# Patient Record
Sex: Female | Born: 1980
Health system: Southern US, Community
[De-identification: ages and names within clinical notes are randomized; demographics above are authoritative.]

## PROBLEM LIST (undated history)

## (undated) ENCOUNTER — Inpatient Hospital Stay (HOSPITAL_COMMUNITY): Payer: Self-pay

## (undated) DIAGNOSIS — G43909 Migraine, unspecified, not intractable, without status migrainosus: Secondary | ICD-10-CM

## (undated) DIAGNOSIS — N62 Hypertrophy of breast: Secondary | ICD-10-CM

## (undated) DIAGNOSIS — K219 Gastro-esophageal reflux disease without esophagitis: Secondary | ICD-10-CM

## (undated) DIAGNOSIS — F419 Anxiety disorder, unspecified: Secondary | ICD-10-CM

## (undated) DIAGNOSIS — R0989 Other specified symptoms and signs involving the circulatory and respiratory systems: Secondary | ICD-10-CM

## (undated) HISTORY — PX: COLPOSCOPY: SHX161

## (undated) HISTORY — DX: Anxiety disorder, unspecified: F41.9

## (undated) HISTORY — PX: CRYOTHERAPY: SHX1416

## (undated) HISTORY — PX: APPENDECTOMY: SHX54

---
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
## 2011-02-19 ENCOUNTER — Ambulatory Visit (INDEPENDENT_AMBULATORY_CARE_PROVIDER_SITE_OTHER): Payer: Commercial Managed Care - PPO

## 2011-02-19 DIAGNOSIS — J029 Acute pharyngitis, unspecified: Secondary | ICD-10-CM

## 2011-02-26 ENCOUNTER — Ambulatory Visit (INDEPENDENT_AMBULATORY_CARE_PROVIDER_SITE_OTHER): Payer: Commercial Managed Care - PPO

## 2011-02-26 DIAGNOSIS — L708 Other acne: Secondary | ICD-10-CM

## 2011-03-24 ENCOUNTER — Ambulatory Visit (INDEPENDENT_AMBULATORY_CARE_PROVIDER_SITE_OTHER): Payer: Commercial Managed Care - PPO | Admitting: Family Medicine

## 2011-03-24 VITALS — BP 108/72 | HR 62 | Temp 98.4°F | Resp 16 | Ht 60.0 in | Wt 164.0 lb

## 2011-03-24 DIAGNOSIS — J019 Acute sinusitis, unspecified: Secondary | ICD-10-CM

## 2011-03-24 DIAGNOSIS — J329 Chronic sinusitis, unspecified: Secondary | ICD-10-CM

## 2011-03-24 DIAGNOSIS — G43909 Migraine, unspecified, not intractable, without status migrainosus: Secondary | ICD-10-CM

## 2011-03-24 MED ORDER — AMOXICILLIN-POT CLAVULANATE 875-125 MG PO TABS: 1.0000 | ORAL_TABLET | Freq: Two times a day (BID) | ORAL | Status: AC

## 2011-03-24 NOTE — Patient Instructions (Signed)

## 2011-03-24 NOTE — Progress Notes (Signed)
  Subjective:    Patient ID: [name] [name], female    DOB: 31-Jul-1980, 31 y.o.   MRN: [id number]  Sinusitis This is a new problem. The current episode started in the past 7 days. The problem is unchanged. There has been no fever. Her pain is at a severity of 1/10. Associated symptoms include congestion. Pertinent negatives include no coughing. Past treatments include spray decongestants. The treatment provided no relief.  Sore Throat  Associated symptoms include congestion. Pertinent negatives include no coughing.      Review of Systems  Constitutional: Negative for fever.  HENT: Positive for congestion.        Sore throat  Respiratory: Negative for cough.   All other systems reviewed and are negative.       Objective:   Physical Exam  Constitutional: She appears well-developed and well-nourished.  HENT:  Head: Normocephalic and atraumatic.  Right Ear: External ear normal.  Left Ear: External ear normal.  Mouth/Throat: Oropharynx is clear and moist. Oropharyngeal exudate: nose has yellow purulent d/c.  Eyes: Conjunctivae and EOM are normal. Pupils are equal, round, and reactive to light.  Neck: Normal range of motion. Neck supple.          Assessment & Plan:  Sinusitis, acute

## 2011-04-07 ENCOUNTER — Other Ambulatory Visit: Payer: Self-pay | Admitting: Physician Assistant

## 2011-04-07 MED ORDER — FLUCONAZOLE 150 MG PO TABS: 150.0000 mg | ORAL_TABLET | Freq: Once | ORAL | Status: AC

## 2011-04-19 ENCOUNTER — Other Ambulatory Visit: Payer: Self-pay

## 2011-04-19 MED ORDER — FLUCONAZOLE 150 MG PO TABS: 150.0000 mg | ORAL_TABLET | Freq: Once | ORAL | Status: AC

## 2011-05-17 ENCOUNTER — Ambulatory Visit (INDEPENDENT_AMBULATORY_CARE_PROVIDER_SITE_OTHER): Payer: Commercial Managed Care - PPO | Admitting: Physician Assistant

## 2011-05-17 VITALS — BP 106/80 | HR 80 | Temp 98.2°F | Resp 16 | Ht 60.0 in | Wt 161.0 lb

## 2011-05-17 DIAGNOSIS — N912 Amenorrhea, unspecified: Secondary | ICD-10-CM

## 2011-05-17 DIAGNOSIS — S63509A Unspecified sprain of unspecified wrist, initial encounter: Secondary | ICD-10-CM

## 2011-05-17 LAB — POCT URINE PREGNANCY: Preg Test, Ur: POSITIVE

## 2011-05-17 MED ORDER — PRENATAL VITAMINS (DIS) PO TABS: 1.0000 | ORAL_TABLET | Freq: Every morning | ORAL | Status: DC

## 2011-05-17 NOTE — Progress Notes (Signed)
  Subjective:    Patient ID: [name] [name], female    DOB: 1981/01/18, 31 y.o.   MRN: [id number]  HPI [name] [name] comes in today c/o right wrist pain for 24 hours.  Initially noticed a bruise on the ulnar aspect of her wirst/hand ~ 5 days ago but had no pain until last pm.  Wrist became swollen and painful on radial aspect.  Pain with ROM.  No erythema, other arthralgias, fever, chills, illness.  Had never had anything like this before.  Denies any injury although walked her large dog Saturday before noticing the bruise and remembers that she had to pull the leash a lot because the dog was pulling.     Review of Systems    as above Objective:   Physical Exam  Right wrist has a small amount of swelling.  Tender to palpation ulnar aspect.  No bruising identified.  Pain radial aspect with extension and some pain with thumb rotation. No pain with flexion  Negative Finkelstein.       Assessment & Plan:  Sprain, right wrist.   Pain wrist Amenorrhea  ACE wrap for now.  Progress into thumb spica, or wrist splint if needed.  Tylenol only.  Ice.  Return if worse. PNV #90

## 2011-05-17 NOTE — Progress Notes (Signed)
Addended by: Pattricia Boss on: 05/17/2011 11:02 AM   Modules accepted: Orders

## 2011-05-17 NOTE — Progress Notes (Signed)
Addended by: Pattricia Boss on: 05/17/2011 10:48 AM   Modules accepted: Orders

## 2011-05-25 ENCOUNTER — Encounter: Payer: Self-pay | Admitting: *Deleted

## 2011-06-03 ENCOUNTER — Telehealth: Payer: Self-pay | Admitting: *Deleted

## 2011-06-03 MED ORDER — ONDANSETRON HCL 4 MG PO TABS: ORAL_TABLET | ORAL | Status: DC

## 2011-06-03 NOTE — Telephone Encounter (Signed)
Patient notified

## 2011-06-03 NOTE — Telephone Encounter (Signed)
PT HAS VOMITING ALL NIGHT AND WOULD LIKE TO KNOW IF WE SEND IN SOME ZOFRAN [facility]

## 2011-06-05 ENCOUNTER — Ambulatory Visit (INDEPENDENT_AMBULATORY_CARE_PROVIDER_SITE_OTHER): Payer: Commercial Managed Care - PPO | Admitting: Family Medicine

## 2011-06-05 VITALS — BP 113/76 | HR 93 | Temp 98.0°F | Resp 16 | Ht 60.0 in | Wt 159.1 lb

## 2011-06-05 DIAGNOSIS — O2690 Pregnancy related conditions, unspecified, unspecified trimester: Secondary | ICD-10-CM

## 2011-06-05 DIAGNOSIS — R1110 Vomiting, unspecified: Secondary | ICD-10-CM

## 2011-06-05 LAB — POCT UA - MICROSCOPIC ONLY
Casts, Ur, LPF, POC: NEGATIVE
Crystals, Ur, HPF, POC: NEGATIVE
Mucus, UA: POSITIVE
Yeast, UA: NEGATIVE

## 2011-06-05 LAB — POCT CBC
Granulocyte percent: 78.1 %G (ref 37–80)
HCT, POC: 41.9 % (ref 37.7–47.9)
Hemoglobin: 13.7 g/dL (ref 12.2–16.2)
Lymph, poc: 1.6 (ref 0.6–3.4)
MCH, POC: 29.7 pg (ref 27–31.2)
MCHC: 32.7 g/dL (ref 31.8–35.4)
MCV: 90.9 fL (ref 80–97)
MID (cbc): 0.4 (ref 0–0.9)
MPV: 8.3 fL (ref 0–99.8)
POC Granulocyte: 7.3 — AB (ref 2–6.9)
POC LYMPH PERCENT: 17.3 %L (ref 10–50)
POC MID %: 4.6 %M (ref 0–12)
Platelet Count, POC: 235 10*3/uL (ref 142–424)
RBC: 4.61 M/uL (ref 4.04–5.48)
RDW, POC: 13.5 %
WBC: 9.3 10*3/uL (ref 4.6–10.2)

## 2011-06-05 LAB — POCT URINALYSIS DIPSTICK
Glucose, UA: NEGATIVE
Ketones, UA: 80
Leukocytes, UA: NEGATIVE
Nitrite, UA: NEGATIVE
Spec Grav, UA: >=1.03
Urobilinogen, UA: 0.2
pH, UA: 5

## 2011-06-05 MED ORDER — ONDANSETRON 8 MG PO TBDP: 8.0000 mg | ORAL_TABLET | Freq: Three times a day (TID) | ORAL | Status: AC | PRN

## 2011-06-05 NOTE — Progress Notes (Signed)
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  She has been on Zofran for 2 days, but still is unable to keep anything down except occasional sips and small snacks.  No UTI sx or fever, but does feel lightheaded, weak, and chilled.  O:  Patient observed in the clinic to be flushed and weak. Abdomen:  Soft, nontender without masses Chest:  Clear Heart: reg, no murmur or gallop, rate about 100 Skin:  Mottled on limbs and abdomen. Results for orders placed in visit on 06/05/11  POCT CBC      Component Value Range   WBC 9.3  4.6 - 10.2 (K/uL)   Lymph, poc 1.6  0.6 - 3.4    POC LYMPH PERCENT 17.3  10 - 50 (%L)   MID (cbc) 0.4  0 - 0.9    POC MID % 4.6  0 - 12 (%M)   POC Granulocyte 7.3 (*) 2 - 6.9    Granulocyte percent 78.1  37 - 80 (%G)   RBC 4.61  4.04 - 5.48 (M/uL)   Hemoglobin 13.7  12.2 - 16.2 (g/dL)   HCT, POC 45.4  09.8 - 47.9 (%)   MCV 90.9  80 - 97 (fL)   MCH, POC 29.7  27 - 31.2 (pg)   MCHC 32.7  31.8 - 35.4 (g/dL)   RDW, POC 11.9     Platelet Count, POC 235  142 - 424 (K/uL)   MPV 8.3  0 - 99.8 (fL)  POCT UA - MICROSCOPIC ONLY      Component Value Range   WBC, Ur, HPF, POC 1-2     RBC, urine, microscopic 1-6     Bacteria, U Microscopic 1+     Mucus, UA positive     Epithelial cells, urine per micros 4-13     Crystals, Ur, HPF, POC negative     Casts, Ur, LPF, POC negative     Yeast, UA negative    POCT URINALYSIS DIPSTICK      Component Value Range   Color, UA yellow     Clarity, UA clear     Glucose, UA negative     Bilirubin, UA small     Ketones, UA 80     Spec Grav, UA >=1.030     Blood, UA moderate     pH, UA 5.0     Protein, UA trace     Urobilinogen, UA 0.2     Nitrite, UA negative     Leukocytes, UA Negative     A: Possibly hyperemesis, more likely viral syndrome.  P:

## 2011-06-05 NOTE — Patient Instructions (Signed)
Hyperemesis Gravidarum  Hyperemesis gravidarum is a severe form of nausea and vomiting that happens during pregnancy. Hyperemesis is worse than morning sickness. It may cause a woman to have nausea or vomiting all day for many days. It may keep a woman from eating and drinking enough food and liquids. Hyperemesis usually occurs during the first half (the first 20 weeks) of pregnancy. It often goes away once a woman is in her second half of pregnancy. However, sometimes hyperemesis continues through an entire pregnancy.   CAUSES   The cause of this condition is not completely known but is thought to be due to changes in the body's hormones when pregnant. It could be the high level of the pregnancy hormone or an increase in estrogen in the body.   SYMPTOMS    Severe nausea and vomiting.   Nausea that does not go away.   Vomiting that does not allow you to keep any food down.   Weight loss and body fluid loss (dehydration).   Having no desire to eat or not liking food you have previously enjoyed.  DIAGNOSIS   Your caregiver may ask you about your symptoms. Your caregiver may also order blood tests and urine tests to make sure something else is not causing the problem.   TREATMENT   You may only need medicine to control the problem. If medicines do not control the nausea and vomiting, you will be treated in the hospital to prevent dehydration, acidosis, weight loss, and changes in the electrolytes in your body that may harm the unborn baby (fetus). You may need intravenous (IV) fluids.   HOME CARE INSTRUCTIONS    Take all medicine as directed by your caregiver.   Try eating a couple of dry crackers or toast in the morning before getting out of bed.   Avoid foods and smells that upset your stomach.   Avoid fatty and spicy foods. Eat 5 to 6 small meals a day.   Do not drink when eating meals. Drink between meals.   For snacks, eat high protein foods, such as cheese. Eat or suck on things that have ginger in  them. Ginger helps nausea.   Avoid food preparation. The smell of food can spoil your appetite.   Avoid iron pills and iron in your multivitamins until after 3 to 4 months of being pregnant.  SEEK MEDICAL CARE IF:    Your abdominal pain increases since the last time you saw your caregiver.   You have a severe headache.   You develop vision problems.   You feel you are losing weight.  SEEK IMMEDIATE MEDICAL CARE IF:    You are unable to keep fluids down.   You vomit blood.   You have constant nausea and vomiting.   You have a fever.   You have excessive weakness, dizziness, fainting, or extreme thirst.  MAKE SURE YOU:    Understand these instructions.   Will watch your condition.   Will get help right away if you are not doing well or get worse.  Document Released: 02/05/2005 Document Revised: 01/25/2011 Document Reviewed: 05/08/2010  [employer] Patient Information 2012 [employer].

## 2011-06-07 ENCOUNTER — Ambulatory Visit (INDEPENDENT_AMBULATORY_CARE_PROVIDER_SITE_OTHER): Payer: Commercial Managed Care - PPO | Admitting: Physician Assistant

## 2011-06-07 DIAGNOSIS — R112 Nausea with vomiting, unspecified: Secondary | ICD-10-CM

## 2011-06-07 DIAGNOSIS — R1084 Generalized abdominal pain: Secondary | ICD-10-CM

## 2011-06-07 LAB — POCT URINALYSIS DIPSTICK
Nitrite, UA: NEGATIVE
Protein, UA: NEGATIVE
Urobilinogen, UA: 0.2
pH, UA: 5.5

## 2011-06-07 LAB — POCT UA - MICROSCOPIC ONLY
Casts, Ur, LPF, POC: NEGATIVE
Crystals, Ur, HPF, POC: NEGATIVE

## 2011-06-07 LAB — POCT CBC
Granulocyte percent: 70.8 %G (ref 37–80)
Hemoglobin: 13.6 g/dL (ref 12.2–16.2)
MCH, POC: 29.8 pg (ref 27–31.2)
MID (cbc): 0.4 (ref 0–0.9)
MPV: 8.5 fL (ref 0–99.8)
POC Granulocyte: 3.8 (ref 2–6.9)
POC MID %: 7.2 %M (ref 0–12)
Platelet Count, POC: 194 10*3/uL (ref 142–424)
RBC: 4.57 M/uL (ref 4.04–5.48)
WBC: 5.3 10*3/uL (ref 4.6–10.2)

## 2011-06-07 LAB — HCG, QUANTITATIVE, PREGNANCY: hCG, Beta Chain, Quant, S: 57037 m[IU]/mL

## 2011-06-07 NOTE — Progress Notes (Signed)
Subjective:    Patient ID: Faythe Ghee, female    DOB: 09-Dec-1980, 31 y.o.   MRN: [id number]  HPI Ms. Eddleman comes in again today for follow up.  She states that she had diarrhea on Saturday and vomiting Saturday pm.  Zofran was called in for her by [place] but continued to vomit.  She started cramping.  Increased generalized abdominal cramping and pain on Monday.  No vomiting as of Monday but continues with loose stool. She was seen here on Tuesday.  CBC was normal (WBC was 9.3) but UA dip with moderate blood.  She denies hematuria or spotting.  She was given 2 liters of NACL IV fluids, which helped some of her symptoms.  Her appetite came back a little bit yesterday.  Today her pain feels like hunger pains and nausea.  Eating continues to be difficult.  Low grade fever today.  Chills and clammy sweats yesterday and today. No dysuria. No unusual vaginal discharge.  Saw OB 05/30/11.  Had [place] for dates.  EDC ~Decemeber 2013.  She has follow up appt with OB tomorrow.   Review of Systems As above    Objective:   Physical Exam  Constitutional: She is oriented to person, place, and time. She appears well-developed and well-nourished. She does not have a sickly appearance. She does not appear ill. No distress.  Cardiovascular: Normal rate and regular rhythm.   Pulmonary/Chest: Effort normal and breath sounds normal.  Abdominal: Soft. Normal appearance. There is tenderness in the right lower quadrant. There is no rebound, no CVA tenderness and no tenderness at McBurney's point.       Mild RLQ pain.  No rebound  Genitourinary: Vagina normal. Uterus is deviated and enlarged. Cervix exhibits discharge. Cervix exhibits no motion tenderness. Right adnexum displays fullness.       White discharge in vault.  Os closed.  No sign of bleeding. Fullness R>L.  Right sided tenderness on external exam but not adnexal.  Neurological: She is alert and oriented to person, place, and time.    Results for orders placed  in visit on 06/07/11  POCT UA - MICROSCOPIC ONLY      Component Value Range   WBC, Ur, HPF, POC 0-1     RBC, urine, microscopic 3-5     Bacteria, U Microscopic 1+     Mucus, UA neg     Epithelial cells, urine per micros 1-3     Crystals, Ur, HPF, POC neg     Casts, Ur, LPF, POC neg     Yeast, UA neg     Amorphous positive    POCT CBC      Component Value Range   WBC 5.3  4.6 - 10.2 (K/uL)   Lymph, poc 1.2  0.6 - 3.4    POC LYMPH PERCENT 22.0  10 - 50 (%L)   MID (cbc) 0.4  0 - 0.9    POC MID % 7.2  0 - 12 (%M)   POC Granulocyte 3.8  2 - 6.9    Granulocyte percent 70.8  37 - 80 (%G)   RBC 4.57  4.04 - 5.48 (M/uL)   Hemoglobin 13.6  12.2 - 16.2 (g/dL)   HCT, POC 40.9  81.1 - 47.9 (%)   MCV 90.6  80 - 97 (fL)   MCH, POC 29.8  27 - 31.2 (pg)   MCHC 32.9  31.8 - 35.4 (g/dL)   RDW, POC 91.4     Platelet Count, POC 194  142 - 424 (K/uL)   MPV 8.5  0 - 99.8 (fL)  POCT URINALYSIS DIPSTICK      Component Value Range   Color, UA yellow     Clarity, UA clear     Glucose, UA neg     Bilirubin, UA small     Ketones, UA trace     Spec Grav, UA >=1.030     Blood, UA moderate     pH, UA 5.5     Protein, UA neg     Urobilinogen, UA 0.2     Nitrite, UA neg     Leukocytes, UA Negative            Assessment & Plan:  Nausea Abdominal pain Hematuria Pregnancy ~ 8 weeks  Likely GI illness.  Push fluids.  Decrease Zofran as can be constipating and cause cramping.  Go to [facility] appointment tomorrow.  Has [place] scheduled for tomorrow.  RLQ pain can be evaluated.  Hematuria needs to be addressed as well.  ? If there previously.  Unknown to patient. Ectopic symptoms reviewed.

## 2011-06-08 LAB — COMPREHENSIVE METABOLIC PANEL
ALT: 24 U/L (ref 0–35)
AST: 22 U/L (ref 0–37)
Albumin: 4.4 g/dL (ref 3.5–5.2)
Alkaline Phosphatase: 68 U/L (ref 39–117)
BUN: 12 mg/dL (ref 6–23)
CO2: 24 mEq/L (ref 19–32)
Calcium: 9.2 mg/dL (ref 8.4–10.5)
Chloride: 101 mEq/L (ref 96–112)
Creat: 0.58 mg/dL (ref 0.50–1.10)
Glucose, Bld: 100 mg/dL — ABNORMAL HIGH (ref 70–99)
Potassium: 4.1 mEq/L (ref 3.5–5.3)
Sodium: 136 mEq/L (ref 135–145)
Total Bilirubin: 0.4 mg/dL (ref 0.3–1.2)
Total Protein: 6.9 g/dL (ref 6.0–8.3)

## 2011-06-18 ENCOUNTER — Telehealth: Payer: Self-pay | Admitting: Family Medicine

## 2011-06-18 MED ORDER — ONDANSETRON HCL 4 MG PO TABS: ORAL_TABLET | ORAL | Status: DC

## 2011-06-18 NOTE — Telephone Encounter (Signed)
Patient would like refill on Zofran (not ODT) to the [facility].  Can we refill?  [facility]

## 2011-06-18 NOTE — Telephone Encounter (Signed)
Done

## 2011-06-19 NOTE — Telephone Encounter (Signed)
LMOM that medication is ready at pharmacy.

## 2011-07-03 ENCOUNTER — Ambulatory Visit (INDEPENDENT_AMBULATORY_CARE_PROVIDER_SITE_OTHER): Payer: Commercial Managed Care - PPO | Admitting: Family Medicine

## 2011-07-03 VITALS — BP 125/82 | HR 81 | Temp 98.4°F | Resp 16 | Ht 59.5 in | Wt 157.0 lb

## 2011-07-03 DIAGNOSIS — R1110 Vomiting, unspecified: Secondary | ICD-10-CM

## 2011-07-03 DIAGNOSIS — R1115 Cyclical vomiting syndrome unrelated to migraine: Secondary | ICD-10-CM

## 2011-07-03 LAB — POCT URINALYSIS DIPSTICK
Bilirubin, UA: NEGATIVE
Glucose, UA: NEGATIVE
Leukocytes, UA: NEGATIVE
Nitrite, UA: NEGATIVE
Protein, UA: NEGATIVE
Spec Grav, UA: 1.025
Urobilinogen, UA: 0.2
pH, UA: 5.5

## 2011-07-03 LAB — COMPREHENSIVE METABOLIC PANEL
ALT: 10 U/L (ref 0–35)
AST: 17 U/L (ref 0–37)
Albumin: 4 g/dL (ref 3.5–5.2)
Alkaline Phosphatase: 60 U/L (ref 39–117)
BUN: 10 mg/dL (ref 6–23)
CO2: 23 mEq/L (ref 19–32)
Calcium: 9.2 mg/dL (ref 8.4–10.5)
Chloride: 104 mEq/L (ref 96–112)
Creat: 0.55 mg/dL (ref 0.50–1.10)
Glucose, Bld: 86 mg/dL (ref 70–99)
Potassium: 3.7 mEq/L (ref 3.5–5.3)
Sodium: 137 mEq/L (ref 135–145)
Total Bilirubin: 0.5 mg/dL (ref 0.3–1.2)
Total Protein: 6.8 g/dL (ref 6.0–8.3)

## 2011-07-03 LAB — POCT UA - MICROSCOPIC ONLY
Casts, Ur, LPF, POC: NEGATIVE
Crystals, Ur, HPF, POC: NEGATIVE
Mucus, UA: NEGATIVE
RBC, urine, microscopic: NEGATIVE
Yeast, UA: NEGATIVE

## 2011-07-03 LAB — POCT CBC
Granulocyte percent: 72.8 %G (ref 37–80)
HCT, POC: 39.2 % (ref 37.7–47.9)
Hemoglobin: 13 g/dL (ref 12.2–16.2)
Lymph, poc: 1.7 (ref 0.6–3.4)
MCH, POC: 30.3 pg (ref 27–31.2)
MCHC: 33.2 g/dL (ref 31.8–35.4)
MCV: 91.3 fL (ref 80–97)
MID (cbc): 0.4 (ref 0–0.9)
MPV: 9.2 fL (ref 0–99.8)
POC Granulocyte: 5.5 (ref 2–6.9)
POC LYMPH PERCENT: 22.2 %L (ref 10–50)
POC MID %: 5 %M (ref 0–12)
Platelet Count, POC: 196 10*3/uL (ref 142–424)
RBC: 4.29 M/uL (ref 4.04–5.48)
RDW, POC: 12.6 %
WBC: 7.5 10*3/uL (ref 4.6–10.2)

## 2011-07-03 MED ORDER — SODIUM CHLORIDE 0.9 % IV SOLN
4.0000 mg | Freq: Once | INTRAVENOUS | Status: AC
  Administered 2011-07-03: 4 mg via INTRAVENOUS

## 2011-07-03 NOTE — Progress Notes (Signed)
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 She came in today with her husband.  [name] is not tendon half weeks and started vomiting last night. She has had no spotting, cramping, or vaginal bleeding. She's had bouts of nausea and vomiting prior to this but for the last week has done fairly well until last night  Last time she took Zofran, she became nauseated further.    Objective: Patient looks pale but is cheerful, alert, and cooperative.  Abdominal exam reveals mild tenderness in the hypogastrium but no acute findings such as guarding or rebound. There no masses, and no HSM.   After the first liter, patient definitely looks better and there has been no vomiting.   Results for orders placed in visit on 07/03/11  POCT CBC      Component Value Range   WBC 7.5  4.6 - 10.2 (K/uL)   Lymph, poc 1.7  0.6 - 3.4    POC LYMPH PERCENT 22.2  10 - 50 (%L)   MID (cbc) 0.4  0 - 0.9    POC MID % 5.0  0 - 12 (%M)   POC Granulocyte 5.5  2 - 6.9    Granulocyte percent 72.8  37 - 80 (%G)   RBC 4.29  4.04 - 5.48 (M/uL)   Hemoglobin 13.0  12.2 - 16.2 (g/dL)   HCT, POC 16.1  09.6 - 47.9 (%)   MCV 91.3  80 - 97 (fL)   MCH, POC 30.3  27 - 31.2 (pg)   MCHC 33.2  31.8 - 35.4 (g/dL)   RDW, POC 04.5     Platelet Count, POC 196  142 - 424 (K/uL)   MPV 9.2  0 - 99.8 (fL)    A: hyperemesis gravidarum without compromise to fetus or evidence of serious ob problem  P:  Patient needs to rest today.  She has the phenergan and zofran tablets to help control the nausea from here CMET and U/A pending followup with OB

## 2011-07-03 NOTE — Patient Instructions (Signed)
Rest.  Drink fluids.  Advance your diet as you are able.  USe the promethazine and ondansetron you have at home as needed.

## 2011-07-07 ENCOUNTER — Inpatient Hospital Stay (HOSPITAL_COMMUNITY): Payer: 59

## 2011-07-07 ENCOUNTER — Observation Stay (HOSPITAL_COMMUNITY)
Admission: AD | Admit: 2011-07-07 | Discharge: 2011-07-07 | Disposition: A | Discharge status: Home / Self Care | Payer: 59 | Source: Ambulatory Visit | Attending: Obstetrics | Admitting: Obstetrics

## 2011-07-07 ENCOUNTER — Telehealth: Payer: Self-pay | Admitting: Physician Assistant

## 2011-07-07 ENCOUNTER — Encounter (HOSPITAL_COMMUNITY): Payer: Self-pay

## 2011-07-07 DIAGNOSIS — O210 Mild hyperemesis gravidarum: Secondary | ICD-10-CM | POA: Insufficient documentation

## 2011-07-07 DIAGNOSIS — R1031 Right lower quadrant pain: Secondary | ICD-10-CM | POA: Insufficient documentation

## 2011-07-07 DIAGNOSIS — O99891 Other specified diseases and conditions complicating pregnancy: Principal | ICD-10-CM | POA: Insufficient documentation

## 2011-07-07 HISTORY — DX: Migraine, unspecified, not intractable, without status migrainosus: G43.909

## 2011-07-07 LAB — URINALYSIS, ROUTINE W REFLEX MICROSCOPIC
Glucose, UA: NEGATIVE mg/dL
Leukocytes, UA: NEGATIVE
Protein, ur: NEGATIVE mg/dL
Specific Gravity, Urine: >1.03 — ABNORMAL HIGH (ref 1.005–1.030)
pH: 6 (ref 5.0–8.0)

## 2011-07-07 LAB — COMPREHENSIVE METABOLIC PANEL
ALT: 25 U/L (ref 0–35)
AST: 24 U/L (ref 0–37)
Albumin: 3.4 g/dL — ABNORMAL LOW (ref 3.5–5.2)
Alkaline Phosphatase: 66 U/L (ref 39–117)
BUN: 8 mg/dL (ref 6–23)
Chloride: 100 mEq/L (ref 96–112)
Potassium: 3.6 mEq/L (ref 3.5–5.1)
Sodium: 134 mEq/L — ABNORMAL LOW (ref 135–145)
Total Bilirubin: 0.4 mg/dL (ref 0.3–1.2)

## 2011-07-07 LAB — DIFFERENTIAL
Basophils Absolute: 0 10*3/uL (ref 0.0–0.1)
Basophils Absolute: 0 10*3/uL (ref 0.0–0.1)
Basophils Relative: 0 % (ref 0–1)
Eosinophils Absolute: 0 10*3/uL (ref 0.0–0.7)
Eosinophils Relative: 0 % (ref 0–5)
Eosinophils Relative: 0 % (ref 0–5)
Lymphocytes Relative: 8 % — ABNORMAL LOW (ref 12–46)
Monocytes Absolute: 0.6 10*3/uL (ref 0.1–1.0)
Monocytes Absolute: 0.6 10*3/uL (ref 0.1–1.0)
Neutro Abs: 11.3 10*3/uL — ABNORMAL HIGH (ref 1.7–7.7)

## 2011-07-07 LAB — URINE MICROSCOPIC-ADD ON

## 2011-07-07 LAB — CBC
HCT: 35.6 % — ABNORMAL LOW (ref 36.0–46.0)
MCH: 30.3 pg (ref 26.0–34.0)
MCHC: 34.4 g/dL (ref 30.0–36.0)
MCV: 87.9 fL (ref 78.0–100.0)
MCV: 87.7 fL (ref 78.0–100.0)
Platelets: 170 10*3/uL (ref 150–400)
Platelets: 176 10*3/uL (ref 150–400)
RDW: 12.9 % (ref 11.5–15.5)
RDW: 12.9 % (ref 11.5–15.5)
WBC: 12.9 10*3/uL — ABNORMAL HIGH (ref 4.0–10.5)

## 2011-07-07 MED ORDER — PRENATAL MULTIVITAMIN CH: 1.0000 | ORAL_TABLET | Freq: Every day | ORAL | Status: DC

## 2011-07-07 MED ORDER — LACTATED RINGERS IV SOLN
INTRAVENOUS | Status: DC
  Administered 2011-07-07 (×2): via INTRAVENOUS

## 2011-07-07 MED ORDER — PROMETHAZINE HCL 25 MG/ML IJ SOLN
25.0000 mg | Freq: Four times a day (QID) | INTRAMUSCULAR | Status: DC | PRN
  Administered 2011-07-07: 25 mg via INTRAVENOUS
  Filled 2011-07-07: qty 1

## 2011-07-07 MED ORDER — ACETAMINOPHEN 500 MG PO TABS
500.0000 mg | ORAL_TABLET | Freq: Four times a day (QID) | ORAL | Status: DC | PRN
  Administered 2011-07-07: 500 mg via ORAL
  Filled 2011-07-07: qty 1

## 2011-07-07 MED ORDER — ONDANSETRON HCL 4 MG PO TABS
4.0000 mg | ORAL_TABLET | ORAL | Status: DC
  Administered 2011-07-07: 4 mg via ORAL
  Filled 2011-07-07: qty 1

## 2011-07-07 MED ORDER — PANTOPRAZOLE SODIUM 40 MG PO TBEC
40.0000 mg | DELAYED_RELEASE_TABLET | Freq: Every day | ORAL | Status: DC
  Administered 2011-07-07: 40 mg via ORAL
  Filled 2011-07-07 (×2): qty 1

## 2011-07-07 NOTE — Progress Notes (Signed)
Pt seen and labs/ u/s reviewed. Care d/w Ivonne Andrew.  Pt w/ N/V and constipation during this preg. On Zofran. Acute onset RLQ pain today w/ diarrhea x 2, no emesis but feels nausea. No fevers. Pt admitted for observation. Nl CBC, UA w/ bili and ketones but no evidence of infection, nl CMP. Abd u/s w/o acute findings and FH doppler present. Prior nl OB labs and u/s in the office.   Since admission pt still notes RLQ pain w/ deep palpation, no emesis, has been NPO for surgical consideration. No nausea but "afraid to eat as I may get sick." No further diarrhea. Has not needed any pain meds. No fevers. Pt notes no pain when at rest.   Filed Vitals:   07/07/11 0937 07/07/11 1252 07/07/11 1254 07/07/11 1335  BP: 119/78 105/72  107/71  Pulse: 100 92  75  Temp: 97.1 F (36.2 C) 97.1 F (36.2 C)  98.1 F (36.7 C)  TempSrc:  Oral  Oral  Resp: 16 16  20   Height:   4' 11.5" (1.511 m)   Weight:   70.58 kg (155 lb 9.6 oz)   SpO2:    96%    Gen: well appearing, no distress Abd: soft, ND, no rebound, no guarding, no RUQ pain or enlargements. Very minimal RLQ pain on deep palpation.  A/P: RLQ pain of unclear etiology, no evidence of acute infection. - will repeat CBC 6 hrs from last and if normal, as well as no worsening of pain and stable vitals would d/c home. Pain likely GI related- recc Miralax for her Zofran induced constipation. Possibly this is round ligament pain as well as pt notes no pain when lying still but pain comes w/ movement.  - check UCx, repeat GC/CT off urine.   Felicity Penix A. 07/07/2011 3:17 PM

## 2011-07-07 NOTE — H&P (Signed)
[name] [name] is a 31 y.o. female G1P0 at [redacted]w[redacted]d presenting for new onset of pain in RLQ since 0430 today. Also nausea and emesis episodes this AM despite Zofran use.  DeniesVB/LOF. No dysuria or frequency. No fever or chills. Some back pain yesterday. No hx kidney stones. Spont IUP, c/w early sono, [facility] 01/30/2012, S=D.  Maternal Medical History:  Reason for admission: Reason for admission: nausea.  Prenatal complications: Severe N/V with 2 episodes of IV hydration at place of employment ([facility]). Has been taking Zofran and phenergan, Lansoprazole for GERD.    OB History    Grav Para Term Preterm Abortions TAB SAB Ect Mult Living   1              Past Medical History  Diagnosis Date  . Migraines    No past surgical history on file. Family History: family history includes Malignant hyperthermia in her other. Social History:  reports that she has never smoked. She does not have any smokeless tobacco history on file. She reports that she does not drink alcohol or use illicit drugs.  Review of Systems  Constitutional: Negative for fever and chills.  Gastrointestinal: Positive for nausea, vomiting and abdominal pain (RLQ since this AM).  Genitourinary: Negative for urgency, frequency and hematuria.  Neurological: Negative for weakness.      Blood pressure 119/78, pulse 100, temperature 97.1 F (36.2 C), resp. rate 16, last menstrual period 04/19/2011. Exam Physical Exam  Gen: AAOx3, NAD CV: RRR Pulm: CTAB Abd: soft, NT except for RLQ, no rigidity or distention, neg Psoas ; pain 5/10 at rest 10/10 with palpation Ext: no edema  FHT's 167 via doppler, no audible decel's  UA with increased SG > 1.030, moderate blood, neg nitrates, neg leuks, + ketones and bili  Renal sono negative Abdominal sono 1. Echogenic focus in the right lower quadrant which is  noncompressible may represent the appendix.  2. The maximal short axis diameters 3.5 mm. This does not meet    criteria for an enlarged appendix and there is therefore no  evidence for acute appendicitis on the basis of this examination.   Assessment/Plan:  IUP at 11 w, RLQ pain acute appendix vs RL pain  Renal calculi not likely given nl renal sono, but possible cystitis, UCX pending Admit for obs,  IV fluids bolus LR 1L then D5LR at 125 cc/hr NPO CBC w dif now and rpt in 6 hrs, CMP Phenergan 25 mg IVP for persistent nausea IV pain meds PRN  Consult Dr. Serita Grammes 07/07/2011, 12:48 PM

## 2011-07-07 NOTE — Discharge Summary (Signed)
Patient ID: [name] [name] MRN: [id number] DOB/AGE: [birth year] 31 y.o.  Admit date: 07/07/2011 Discharge date:  07/07/2011   Admission Diagnoses:  Intrauterine pregnancy at 11 2/7 weeks Abdominal pain Nausea of pregnancy  Discharge Diagnoses:   Intrauterine pregnancy at 11 2/7 wks, undelivered Abdominal pain  Prenatal history: G1P0   EDC : 01/24/2012, by Last Menstrual Period  Prenatal care at [facility]  Primary provider : Dr. Juliene Pina Prenatal course complicated by hyperemesis    Medical / Surgical History :  Past medical history:  Past Medical History  Diagnosis Date  . Migraines     Past surgical history: History reviewed. No pertinent past surgical history.  Family History:  Family History  Problem Relation Age of Onset  . Malignant hyperthermia Other     Social History:  reports that she has never smoked. She does not have any smokeless tobacco history on file. She reports that she does not drink alcohol or use illicit drugs.  Allergies: Review of patient's allergies indicates no known allergies.   Current Medications at time of admission:  Prior to Admission medications   Medication Sig Start Date End Date Taking? Authorizing Provider  acetaminophen (TYLENOL) 500 MG tablet Take 500 mg by mouth every 6 (six) hours as needed. pain   Yes Historical Provider, MD  lansoprazole (PREVACID) 15 MG capsule Take 15 mg by mouth daily.   Yes Historical Provider, MD  ondansetron (ZOFRAN) 4 MG tablet 1 to 2 tid prn nausea 06/18/11  Yes Sondra Barges, PA-C  Prenatal Vit-Fe Fumarate-FA (PRENATAL MULTIVITAMIN) TABS Take 1 tablet by mouth daily.   Yes Historical Provider, MD  promethazine (PHENERGAN) 25 MG tablet Take 25 mg by mouth every 6 (six) hours as needed.   Yes Historical Provider, MD     Hospital Course:  Patient was seen in MAU w/ acute onset RLQ pain today w/ diarrhea x 2, no emesis but feels nausea. No fevers. Pt admitted for observation. Nl CBC, UA w/  bili and ketones but no evidence of infection, nl CMP. Abd u/s w/o acute findings and FH doppler present. Prior nl OB labs and u/s in the office. IV hydration and antiemetics given and patient was observed on the [facility] Unit x 6 hours until CBC repeated showed decrease in WBC from 12.9 to 11.2. Pain decreased substantially from this AM and she was able to eat dinner.  Abdominal pain likely due to gastrointestinal upset vs right ligament pain. Patient was discharged home in stable condition to follow up in the office this week.      Physical Exam:   VSS: Blood pressure 98/66, pulse 59, temperature 98.8 F (37.1 C), temperature source Oral, resp. rate 20, height 4' 11.5" (1.511 m), weight 70.58 kg (155 lb 9.6 oz), last menstrual period 04/19/2011, SpO2 99.00%.  LABS: WBC/Hgb/Hct/Plts:  11.2/12.3/35.6/176 (05/18 1845)  General: AAO x3 NAD Heart: RRR Lungs:CTAB Abdomen: soft, NT, min pain with palp. No edema Extremities:   Fetal assessment  Discharge Instructions:  Discharged Condition: good Activity: unrestricted Diet: routine Medications: Medication List  As of 07/07/2011  9:10 PM   TAKE these medications         acetaminophen 500 MG tablet   Commonly known as: TYLENOL   Take 500 mg by mouth every 6 (six) hours as needed. pain      lansoprazole 15 MG capsule   Commonly known as: PREVACID   Take 15 mg by mouth daily.      ondansetron 4 MG  tablet   Commonly known as: ZOFRAN   1 to 2 tid prn nausea      prenatal multivitamin Tabs   Take 1 tablet by mouth daily.      promethazine 25 MG tablet   Commonly known as: PHENERGAN   Take 25 mg by mouth every 6 (six) hours as needed.           Condition: stable Discharge Instructions:   Discharge to: home Disposition: Final discharge disposition not confirmed Follow up :  Follow-up Information    Follow up with MODY,VAISHALI R, MD. Call in 2 days.   Contact information:   [address] [place]  [address] [phone]           Signed: Arlan Organ 07/07/2011, 9:10 PM

## 2011-07-07 NOTE — MAU Note (Signed)
Onset of right to mid lower abdominal pain since 0400 a.m. Today, patient has had nausea vomiting since found out pregnant today vomited x 2, has had several bowel movements today.

## 2011-07-07 NOTE — Telephone Encounter (Signed)
Patient called stating that she was having RLQ abdominal pain this morning. Her question was should she be seen. I advised her to go straight to the ER at [facility]. She agrees to do so.

## 2011-07-08 LAB — URINE CULTURE: Colony Count: 1000

## 2011-07-09 LAB — GC/CHLAMYDIA PROBE AMP, URINE
Chlamydia, Swab/Urine, PCR: NEGATIVE
GC Probe Amp, Urine: NEGATIVE

## 2011-07-15 ENCOUNTER — Other Ambulatory Visit: Payer: Self-pay | Admitting: Physician Assistant

## 2011-07-15 MED ORDER — PROMETHAZINE HCL 25 MG PO TABS: 25.0000 mg | ORAL_TABLET | Freq: Four times a day (QID) | ORAL | Status: DC | PRN

## 2011-07-16 ENCOUNTER — Ambulatory Visit (INDEPENDENT_AMBULATORY_CARE_PROVIDER_SITE_OTHER): Payer: 59 | Admitting: Physician Assistant

## 2011-07-16 VITALS — BP 108/75 | HR 105 | Temp 98.4°F | Resp 18 | Ht 59.5 in | Wt 152.6 lb

## 2011-07-16 DIAGNOSIS — O219 Vomiting of pregnancy, unspecified: Secondary | ICD-10-CM

## 2011-07-16 MED ORDER — ONDANSETRON HCL 4 MG/2ML IJ SOLN
2.0000 mg | Freq: Once | INTRAMUSCULAR | Status: AC
  Administered 2011-07-16: 2 mg via INTRAVENOUS

## 2011-07-16 NOTE — Patient Instructions (Signed)
Slowly advance your liquids as tolerated, as we discussed.

## 2011-07-16 NOTE — Progress Notes (Signed)
  Subjective:    Patient ID: [name] [name], female    DOB: 04-Aug-1980, 31 y.o.   MRN: [id number]  HPI Patient presents with nausea and vomiting.  Since the beginning of her pregnancy she has had episodes like this, several requiring IV hydration despite Zofran tablets, oral phenergan, and injectable phenergan.  This episode began on the evening of 07/14/2011, following 5-6 days of feeling well.  Vomiting began 07/15/2011. With heaving, she has some loose stool escape.  Last fluid she kept down was 5/25. Developed a headache this morning. Occasional, brief low abdominal cramps, usually associated with bowel movements.   Review of Systems As above.    Objective:   Physical Exam  Vital signs noted. Well-developed, well nourished WF who is awake, alert and oriented, in NAD. HEENT: [place]/AT, sclera and conjunctiva are clear.   Neck: supple, non-tender, no lymphadenopathy, thyromegaly. Heart: RRR, no murmur Lungs: CTA Extremities: no cyanosis, clubbing or edema. Skin: warm and dry without rash.     Assessment & Plan:   1. Nausea and vomiting in pregnancy  ondansetron (ZOFRAN) injection 2 mg   Headache resolved and nausea nearly resolved after 3 L of NS.  Was able to urinate.  Supportive care at home, advancing oral hydration as able.  RTC if unable.  F/U with OB as planned.

## 2011-07-18 NOTE — Progress Notes (Signed)
Post discharge review completed. 

## 2011-07-26 ENCOUNTER — Ambulatory Visit (INDEPENDENT_AMBULATORY_CARE_PROVIDER_SITE_OTHER): Payer: 59 | Admitting: Physician Assistant

## 2011-07-26 VITALS — BP 115/78 | HR 85 | Temp 98.2°F | Resp 16 | Ht 59.75 in | Wt 153.0 lb

## 2011-07-26 DIAGNOSIS — O210 Mild hyperemesis gravidarum: Secondary | ICD-10-CM

## 2011-07-26 DIAGNOSIS — R110 Nausea: Secondary | ICD-10-CM

## 2011-07-26 DIAGNOSIS — R8281 Pyuria: Secondary | ICD-10-CM

## 2011-07-26 LAB — POCT UA - MICROSCOPIC ONLY
Casts, Ur, LPF, POC: NEGATIVE
Mucus, UA: NEGATIVE
RBC, urine, microscopic: NEGATIVE

## 2011-07-26 LAB — POCT URINALYSIS DIPSTICK
Blood, UA: NEGATIVE
Glucose, UA: NEGATIVE
Spec Grav, UA: 1.015
Urobilinogen, UA: 0.2

## 2011-07-26 LAB — POCT CBC
Granulocyte percent: 72.5 %G (ref 37–80)
Hemoglobin: 13.6 g/dL (ref 12.2–16.2)
MCH, POC: 29.4 pg (ref 27–31.2)
MCV: 91.2 fL (ref 80–97)
MPV: 9.5 fL (ref 0–99.8)
POC MID %: 4 %M (ref 0–12)
Platelet Count, POC: 237 10*3/uL (ref 142–424)
RBC: 4.62 M/uL (ref 4.04–5.48)
WBC: 9.3 10*3/uL (ref 4.6–10.2)

## 2011-07-26 MED ORDER — ONDANSETRON HCL 4 MG/2ML IJ SOLN
4.0000 mg | Freq: Once | INTRAMUSCULAR | Status: AC
  Administered 2011-07-26: 4 mg via INTRAVENOUS

## 2011-07-26 NOTE — Progress Notes (Signed)
Patient ID: [name] [name] MRN: [id number], DOB: 05-09-1980, 31 y.o. Date of Encounter: 07/26/2011, 12:19 PM  Primary Physician: Coralee Rud, PA, PA  Chief Complaint: Nausea  HPI: 31 y.o. year old female with history below presents with return of mild nausea. She unfortunately has had episodes like this since the beginning of her pregnancy requiring several visits for IV hydration to include Zofran either ODT or IV. See previous office visits. States that she had one episode of emesis 3 days ago, follow by one episode two days ago, and another episode the previous evening. Nausea has persisted. She is pushing fluids as best as she can. Has taken Zofran with success once her emesis starts, otherwise she finds that phenergan helps the most. She has remained afebrile and without chills. No headache. No abdominal pain. No GU or vaginal complaints. No bleeding or spotting. No diarrhea or constipation.    Past Medical History  Diagnosis Date  . Migraines      Home Meds: Prior to Admission medications   Medication Sig Start Date End Date Taking? Authorizing Provider  acetaminophen (TYLENOL) 500 MG tablet Take 500 mg by mouth every 6 (six) hours as needed. pain   Yes Historical Provider, MD  lansoprazole (PREVACID) 15 MG capsule Take 15 mg by mouth daily.   Yes Historical Provider, MD  metoCLOPramide (REGLAN) 10 MG tablet Take 10 mg by mouth 3 (three) times daily.   Yes Historical Provider, MD  ondansetron (ZOFRAN) 4 MG tablet 1 to 2 tid prn nausea 06/18/11  Yes Sondra Barges, PA-C  Prenatal Vit-Fe Fumarate-FA (PRENATAL MULTIVITAMIN) TABS Take 1 tablet by mouth daily.   Yes Historical Provider, MD  promethazine (PHENERGAN) 25 MG tablet Take 1 tablet (25 mg total) by mouth every 6 (six) hours as needed. 07/15/11  Yes Anders Simmonds, PA-C    Allergies: No Known Allergies  History   Social History  . Marital Status: Single    Spouse Name: N/A    Number of Children: N/A  . Years of Education:  N/A   Occupational History  . Not on file.   Social History Main Topics  . Smoking status: Never Smoker   . Smokeless tobacco: Not on file  . Alcohol Use: No  . Drug Use: No  . Sexually Active: Yes   Other Topics Concern  . Not on file   Social History Narrative  . No narrative on file     Review of Systems: Constitutional: negative for chills, fever, night sweats, weight changes, or fatigue  HEENT: negative for vision changes, hearing loss, congestion, rhinorrhea, ST, epistaxis, or sinus pressure Cardiovascular: negative for chest pain or palpitations Respiratory: negative for hemoptysis, wheezing, shortness of breath, or cough Abdominal: see above Dermatological: negative for rash Neurologic: negative for headache, dizziness, or syncope All other systems reviewed and are otherwise negative with the exception to those above and in the HPI.   Physical Exam: Blood pressure 115/78, pulse 85, temperature 98.2 F (36.8 C), temperature source Oral, resp. rate 16, height 4' 11.75" (1.518 m), weight 153 lb (69.4 kg), last menstrual period 04/19/2011, SpO2 97.00%., Body mass index is 30.13 kg/(m^2). General: Well developed, well nourished, in no acute distress. Head: Normocephalic, atraumatic, eyes without discharge, sclera non-icteric, nares are without discharge. Bilateral auditory canals clear, TM's are without perforation, pearly grey and translucent with reflective cone of light bilaterally. Oral cavity moist, posterior pharynx without exudate, erythema, peritonsillar abscess, or post nasal drip.  Neck: Supple. No thyromegaly. Full  ROM. No lymphadenopathy. Lungs: Clear bilaterally to auscultation without wheezes, rales, or rhonchi. Breathing is unlabored. Heart: RRR with S1 S2. No murmurs, rubs, or gallops appreciated. Msk:  Strength and tone normal for age. Extremities/Skin: Warm and dry. No clubbing or cyanosis. No edema. No rashes or suspicious lesions. Neuro: Alert and  oriented X 3. Moves all extremities spontaneously. Gait is normal. CNII-XII grossly in tact. Psych:  Responds to questions appropriately with a normal affect.   Labs: Results for orders placed in visit on 07/26/11  POCT CBC      Component Value Range   WBC 9.3  4.6 - 10.2 (K/uL)   Lymph, poc 2.2  0.6 - 3.4    POC LYMPH PERCENT 23.5  10 - 50 (%L)   MID (cbc) 0.4  0 - 0.9    POC MID % 4.0  0 - 12 (%M)   POC Granulocyte 6.7  2 - 6.9    Granulocyte percent 72.5  37 - 80 (%G)   RBC 4.62  4.04 - 5.48 (M/uL)   Hemoglobin 13.6  12.2 - 16.2 (g/dL)   HCT, POC 16.1  09.6 - 47.9 (%)   MCV 91.2  80 - 97 (fL)   MCH, POC 29.4  27 - 31.2 (pg)   MCHC 32.3  31.8 - 35.4 (g/dL)   RDW, POC 04.5     Platelet Count, POC 237  142 - 424 (K/uL)   MPV 9.5  0 - 99.8 (fL)  POCT UA - MICROSCOPIC ONLY      Component Value Range   WBC, Ur, HPF, POC 2-5     RBC, urine, microscopic neg     Bacteria, U Microscopic 4+     Mucus, UA neg     Epithelial cells, urine per micros 2-5     Crystals, Ur, HPF, POC neg     Casts, Ur, LPF, POC neg     Yeast, UA neg    POCT URINALYSIS DIPSTICK      Component Value Range   Color, UA yellow     Clarity, UA slightly cloudy     Glucose, UA neg     Bilirubin, UA neg     Ketones, UA trace     Spec Grav, UA 1.015     Blood, UA neg     pH, UA 6.0     Protein, UA neg     Urobilinogen, UA 0.2     Nitrite, UA neg     Leukocytes, UA Negative     UC pending  ASSESSMENT AND PLAN:  31 y.o. year old female with hyperemesis gravidarum  -IV hydration x 2 liters -Zofran 4mg  IV -Patient feeling considerably better at 12:30 pm -Supportive care -Await urine culture, asymptomatic -Advance oral hydration as tolerated -RTC/ER precautions -Follow up with OB as directed  Signed, Eula Listen, PA-C 07/26/2011 12:19 PM

## 2011-07-28 LAB — URINE CULTURE: Colony Count: 2000

## 2011-08-06 ENCOUNTER — Ambulatory Visit (INDEPENDENT_AMBULATORY_CARE_PROVIDER_SITE_OTHER): Payer: 59 | Admitting: Obstetrics and Gynecology

## 2011-08-06 DIAGNOSIS — Z331 Pregnant state, incidental: Secondary | ICD-10-CM

## 2011-08-06 DIAGNOSIS — Z3201 Encounter for pregnancy test, result positive: Secondary | ICD-10-CM

## 2011-08-06 LAB — POCT URINALYSIS DIPSTICK
Bilirubin, UA: NEGATIVE
Blood, UA: 1
Glucose, UA: NEGATIVE
Ketones, UA: NEGATIVE
Leukocytes, UA: NEGATIVE
Nitrite, UA: NEGATIVE

## 2011-08-06 NOTE — Progress Notes (Signed)
PT STATES HAD U/S DONE  @ [facility], GIVING HER A DUE DATE OF 01/30/12, PT COMPLETED A ROI FORM TO HAVE RECORDS FAXED TO THIS OFFICE TO VERIFY THAT DATE.  ALSO STATES NO PRENATAL LABS DONE THERE, PT HAD PN LABS DONE TODAY.

## 2011-08-07 LAB — PRENATAL PANEL VII
Basophils Relative: 0 % (ref 0–1)
HCT: 38.6 % (ref 36.0–46.0)
HIV: NONREACTIVE
Hemoglobin: 13.3 g/dL (ref 12.0–15.0)
Hepatitis B Surface Ag: NEGATIVE
Lymphocytes Relative: 19 % (ref 12–46)
Lymphs Abs: 1.5 10*3/uL (ref 0.7–4.0)
Monocytes Absolute: 0.4 10*3/uL (ref 0.1–1.0)
Monocytes Relative: 6 % (ref 3–12)
Neutro Abs: 5.8 10*3/uL (ref 1.7–7.7)
Neutrophils Relative %: 74 % (ref 43–77)
RBC: 4.32 MIL/uL (ref 3.87–5.11)
Rh Type: POSITIVE
WBC: 7.8 10*3/uL (ref 4.0–10.5)

## 2011-08-07 LAB — CULTURE, OB URINE: Organism ID, Bacteria: NO GROWTH

## 2011-08-13 ENCOUNTER — Ambulatory Visit (INDEPENDENT_AMBULATORY_CARE_PROVIDER_SITE_OTHER): Payer: 59

## 2011-08-13 VITALS — BP 90/58 | Wt 157.0 lb

## 2011-08-13 DIAGNOSIS — Z124 Encounter for screening for malignant neoplasm of cervix: Secondary | ICD-10-CM

## 2011-08-13 DIAGNOSIS — G43909 Migraine, unspecified, not intractable, without status migrainosus: Secondary | ICD-10-CM

## 2011-08-13 DIAGNOSIS — Z87898 Personal history of other specified conditions: Secondary | ICD-10-CM | POA: Insufficient documentation

## 2011-08-13 DIAGNOSIS — Z8742 Personal history of other diseases of the female genital tract: Secondary | ICD-10-CM

## 2011-08-13 DIAGNOSIS — Z8659 Personal history of other mental and behavioral disorders: Secondary | ICD-10-CM

## 2011-08-13 LAB — OB RESULTS CONSOLE GC/CHLAMYDIA: Gonorrhea: NEGATIVE

## 2011-08-13 NOTE — Progress Notes (Signed)
Pain with intercourse and pelvic pain with coughing and sneezing.

## 2011-08-14 ENCOUNTER — Telehealth: Payer: Self-pay | Admitting: Obstetrics and Gynecology

## 2011-08-14 ENCOUNTER — Ambulatory Visit (INDEPENDENT_AMBULATORY_CARE_PROVIDER_SITE_OTHER): Payer: Commercial Managed Care - PPO | Admitting: Physician Assistant

## 2011-08-14 VITALS — BP 105/73 | HR 85 | Temp 98.3°F | Resp 16

## 2011-08-14 DIAGNOSIS — R51 Headache: Secondary | ICD-10-CM

## 2011-08-14 DIAGNOSIS — O210 Mild hyperemesis gravidarum: Secondary | ICD-10-CM

## 2011-08-14 MED ORDER — ESOMEPRAZOLE MAGNESIUM 40 MG PO CPDR: 40.0000 mg | DELAYED_RELEASE_CAPSULE | Freq: Every day | ORAL | Status: DC

## 2011-08-14 MED ORDER — ONDANSETRON HCL 4 MG PO TABS: ORAL_TABLET | ORAL | Status: DC

## 2011-08-14 NOTE — Telephone Encounter (Signed)
Triage/HS pt

## 2011-08-14 NOTE — Progress Notes (Signed)
  Subjective:    Patient ID: [name] [name], female    DOB: 11-18-1980, 31 y.o.   MRN: [id number]  HPI 31 yo female presents with nausea and vomiting today. Says the symptoms started this morning and since onset she has been vomiting every 30-40 minutes. Complains of slight headache. Unable to keep fluids or food down. No abdominal pain, vaginal complains, bleeding, or spotting.   She saw her OB yesterday who was supposed to refill her Zofran as well as change her Prevacid to Nexium. She has not sent these to the pharmacy yet so she is requesting those refills today.    Review of Systems  Constitutional: Negative for fever and chills.  Respiratory: Negative for cough.   Gastrointestinal: Positive for nausea and vomiting. Negative for abdominal pain and diarrhea.  Genitourinary: Negative for dysuria and vaginal bleeding.  Neurological: Positive for headaches.       Objective:   Physical Exam  Constitutional: She is oriented to person, place, and time. She appears well-developed and well-nourished.  HENT:  Head: Normocephalic and atraumatic.  Right Ear: External ear normal.  Left Ear: External ear normal.  Eyes: Conjunctivae are normal.  Neck: Normal range of motion.  Cardiovascular: Normal rate, regular rhythm and normal heart sounds.   Pulmonary/Chest: Effort normal and breath sounds normal.  Neurological: She is alert and oriented to person, place, and time.  Psychiatric: She has a normal mood and affect. Her behavior is normal. Judgment and thought content normal.          Assessment & Plan:   1. Hyperemesis gravidarum  Continue to push fluids. Take Zofran as needed for nausea.   2. Headache

## 2011-08-15 LAB — PAP IG, CT-NG, RFX HPV ASCU: Chlamydia Probe Amp: NEGATIVE

## 2011-08-20 ENCOUNTER — Telehealth: Payer: Self-pay | Admitting: Obstetrics and Gynecology

## 2011-08-20 NOTE — Telephone Encounter (Signed)
Lm on vm tcb rgd msg 

## 2011-08-20 NOTE — Telephone Encounter (Signed)
Triage/rx follow up °

## 2011-08-21 NOTE — Telephone Encounter (Signed)
Spoke with pt rgd msg pt stets rx not sent to pharm for nexium and Toradol had to have job write rx apologized for the inconvience call ended

## 2011-09-05 ENCOUNTER — Other Ambulatory Visit: Payer: Self-pay | Admitting: Physician Assistant

## 2011-09-05 MED ORDER — ONDANSETRON HCL 4 MG PO TABS: ORAL_TABLET | ORAL | Status: DC

## 2011-09-05 MED ORDER — PROMETHAZINE HCL 25 MG PO TABS: 25.0000 mg | ORAL_TABLET | Freq: Four times a day (QID) | ORAL | Status: DC | PRN

## 2011-09-10 ENCOUNTER — Other Ambulatory Visit: Payer: Self-pay

## 2011-09-10 DIAGNOSIS — Z3689 Encounter for other specified antenatal screening: Secondary | ICD-10-CM

## 2011-09-11 ENCOUNTER — Other Ambulatory Visit: Payer: 59

## 2011-09-11 ENCOUNTER — Ambulatory Visit (INDEPENDENT_AMBULATORY_CARE_PROVIDER_SITE_OTHER): Payer: 59

## 2011-09-11 ENCOUNTER — Ambulatory Visit (INDEPENDENT_AMBULATORY_CARE_PROVIDER_SITE_OTHER): Payer: 59 | Admitting: Obstetrics and Gynecology

## 2011-09-11 ENCOUNTER — Encounter: Payer: Self-pay | Admitting: Obstetrics and Gynecology

## 2011-09-11 VITALS — Wt 162.0 lb

## 2011-09-11 DIAGNOSIS — Z331 Pregnant state, incidental: Secondary | ICD-10-CM

## 2011-09-11 DIAGNOSIS — Z3490 Encounter for supervision of normal pregnancy, unspecified, unspecified trimester: Secondary | ICD-10-CM

## 2011-09-11 DIAGNOSIS — Z3689 Encounter for other specified antenatal screening: Secondary | ICD-10-CM

## 2011-09-11 LAB — US OB COMP + 14 WK

## 2011-09-11 MED ORDER — ESOMEPRAZOLE MAGNESIUM 40 MG PO PACK: 40.0000 mg | PACK | Freq: Every day | ORAL | Status: DC

## 2011-09-11 NOTE — Progress Notes (Signed)
[redacted]w[redacted]d F/U anatomy USS today USS: EFW: 11 oz, Vx presentation, Anterior Placenta, Fluid is normal. Anatomy Normal, Female                     Normal Ovaries, no fluid in CDS, Normal [place] No complaints, Nausea improved and Nexium has helped acid reflux No change in vaginal secretions Needs Early Glucola - ordered Return in  4 wks

## 2011-09-11 NOTE — Addendum Note (Signed)
Addended by: Earl Gala on: 09/11/2011 10:13 AM   Modules accepted: Orders

## 2011-09-21 ENCOUNTER — Other Ambulatory Visit: Payer: 59

## 2011-09-21 DIAGNOSIS — Z3490 Encounter for supervision of normal pregnancy, unspecified, unspecified trimester: Secondary | ICD-10-CM

## 2011-09-22 LAB — GLUCOSE TOLERANCE, 1 HOUR (50G) W/O FASTING: Glucose, 1 Hour GTT: 115 mg/dL (ref 70–140)

## 2011-10-12 ENCOUNTER — Ambulatory Visit (INDEPENDENT_AMBULATORY_CARE_PROVIDER_SITE_OTHER): Payer: 59 | Admitting: Obstetrics and Gynecology

## 2011-10-12 ENCOUNTER — Encounter: Payer: Self-pay | Admitting: Obstetrics and Gynecology

## 2011-10-12 VITALS — BP 102/62 | Wt 170.0 lb

## 2011-10-12 DIAGNOSIS — Z331 Pregnant state, incidental: Secondary | ICD-10-CM

## 2011-10-12 NOTE — Progress Notes (Signed)
[redacted]w[redacted]d with minimal movement 1 hour Glucola was 115 on 09/21/11. To repeat >= 28 weeks

## 2011-10-12 NOTE — Progress Notes (Signed)
[redacted]w[redacted]d Pt has no complaints   

## 2011-10-26 ENCOUNTER — Encounter: Payer: 59 | Admitting: Obstetrics and Gynecology

## 2011-11-05 ENCOUNTER — Ambulatory Visit (INDEPENDENT_AMBULATORY_CARE_PROVIDER_SITE_OTHER): Payer: 59 | Admitting: Obstetrics and Gynecology

## 2011-11-05 VITALS — BP 100/64 | Wt 175.0 lb

## 2011-11-05 DIAGNOSIS — Z331 Pregnant state, incidental: Secondary | ICD-10-CM

## 2011-11-05 NOTE — Progress Notes (Signed)
Patient ID: [name] [name], female   DOB: 01/07/1981, 31 y.o.   MRN: [id number] [redacted]w[redacted]d Reviewed s/s preterm labor, srom, vag bleeding,daily kick counts to report, encouraged 8 water daily and frequent voids. Discussed common discomforts of pg, excessive weight gain, nutrition. Avoid sauces, gravy, sweet tea, soda, fried foods, limit eating out and watch food choices and portion sizes. No more than 1/2 cup juice daily, better to eat fruit then drink juice. Increase fiber in diet, fresh rather than processed foods. Get protein throught meals and snacks to include meat, eggs, beans, nuts skim an fat free dairy: milk, cheese, yougart, 8 glasses of water daily. Exercise discussed. Lavera Guise, CNM

## 2011-11-05 NOTE — Progress Notes (Signed)
No c/o per pt

## 2011-11-05 NOTE — Progress Notes (Signed)
Patient ID: [name] [name], female   DOB: [birth year], 31 y.o.   MRN: [id number] F/o 1 gtt is O positive Lavera Guise, CNM

## 2011-11-13 ENCOUNTER — Other Ambulatory Visit: Payer: Self-pay | Admitting: Physician Assistant

## 2011-11-19 ENCOUNTER — Encounter: Payer: 59 | Admitting: Obstetrics and Gynecology

## 2011-11-21 ENCOUNTER — Ambulatory Visit (INDEPENDENT_AMBULATORY_CARE_PROVIDER_SITE_OTHER): Payer: 59 | Admitting: Obstetrics and Gynecology

## 2011-11-21 VITALS — BP 120/64 | Wt 178.0 lb

## 2011-11-21 DIAGNOSIS — Z331 Pregnant state, incidental: Secondary | ICD-10-CM

## 2011-11-21 DIAGNOSIS — Z3490 Encounter for supervision of normal pregnancy, unspecified, unspecified trimester: Secondary | ICD-10-CM

## 2011-11-21 NOTE — Progress Notes (Signed)
Doing well. Glucola NV.

## 2011-11-26 ENCOUNTER — Ambulatory Visit (INDEPENDENT_AMBULATORY_CARE_PROVIDER_SITE_OTHER): Payer: 59 | Admitting: Obstetrics and Gynecology

## 2011-11-26 ENCOUNTER — Telehealth: Payer: Self-pay | Admitting: Obstetrics and Gynecology

## 2011-11-26 LAB — FETAL FIBRONECTIN: Fetal Fibronectin: NEGATIVE

## 2011-11-26 NOTE — Telephone Encounter (Signed)
Pt called, is 30 wks 5 days, has been having increased pelvic pressure and pain off/on that is becoming more painful when she has it.  Pressure is more intense while standing.  Pt denies any bldg/leaking, does have +fm, but pressure is becoming more uncomfortable.  Pt says that she drinks 2 liters of water/day.

## 2011-11-26 NOTE — Progress Notes (Signed)
[redacted]w[redacted]d Pt c/o constant pressure. Requests cx check.

## 2011-11-26 NOTE — Telephone Encounter (Signed)
Tc to pt regarding msg.  Lm on vm to call back. 

## 2011-11-26 NOTE — Progress Notes (Signed)
[redacted]w[redacted]d Reports constant supra-pubic pressure. Has just completed 3 x 12 hour shifts. No contractions. No change in discharge. GFM. FFN done. Cervix is very anterior. Recommend maternity support belt and soaking in warm bathtub. PTL warning signs reviewed. Pt is expecting TC today for results.

## 2011-11-26 NOTE — Telephone Encounter (Signed)
[name], can you advise this pt.

## 2011-11-27 ENCOUNTER — Telehealth: Payer: Self-pay

## 2011-11-27 NOTE — Telephone Encounter (Signed)
PC attempted on Mon pm to notify of negative FFN.  LM this am regarding negative FFN and to [facility] if further questions.  ld

## 2011-12-05 ENCOUNTER — Emergency Department
Admission: EM | Admit: 2011-12-05 | Discharge: 2011-12-05 | Disposition: A | Discharge status: Home / Self Care | Payer: 59 | Source: Home / Self Care | Attending: Family Medicine | Admitting: Family Medicine

## 2011-12-05 ENCOUNTER — Encounter: Payer: Self-pay | Admitting: Emergency Medicine

## 2011-12-05 DIAGNOSIS — J069 Acute upper respiratory infection, unspecified: Secondary | ICD-10-CM

## 2011-12-05 NOTE — ED Provider Notes (Signed)
History     CSN: [id number]  Arrival date & time 12/05/11  1158   None     Chief Complaint  Patient presents with  . Sore Throat     HPI Comments: Patient complains of 3 day history of gradually progressive URI symptoms beginning with a mild sore throat (now improved), followed by progressive nasal congestion.  A cough started today.  Complains of fatigue but no myalgias.  Cough is generally non-productive.  There has been no pleuritic pain, shortness of breath, or wheezes.   Patient is [redacted] weeks pregnant.  The history is provided by the patient.    Past Medical History  Diagnosis Date  . Migraines     MIGRAINES;HAS TAKEN MAXALT AND VICODIN IN THE PAST OR CAFFEINE  . Depression     HAS TAKEN LEXAPRO 40MG  DAILY BEFORE PREGNANCY;WEANED SELF OFF  . Infection     YEAST INF;TYPICALLY AFTER ANTIBXS;NOT FREQ  . Abnormal Pap smear 2005    PRE-CANCEROUS CELLS ON CX;COLPO DONE AND CELLS "BURNED OFF";LAST PAP 2010 OR 2011,WAS NORMAL  . Anginal pain     Past Surgical History  Procedure Date  . Wisdom tooth extraction 1998    ALL 4 EXTRACTED    Family History  Problem Relation Age of Onset  . Migraines Father     IN THE PAST  . Migraines Paternal Grandfather   . Thyroid disease Mother   . Alzheimer's disease Father     DX'D @ 51    History  Substance Use Topics  . Smoking status: Never Smoker   . Smokeless tobacco: Never Used  . Alcohol Use: No     RARELY    OB History    Grav Para Term Preterm Abortions TAB SAB Ect Mult Living   1               Review of Systems + sore throat + cough today No pleuritic pain No wheezing + nasal congestion + post-nasal drainage No sinus pain/pressure No itchy/red eyes No earache No hemoptysis No SOB No fever, + sweats No nausea No vomiting No abdominal pain.  No pelvic pain or vaginal bleeding. No diarrhea No urinary symptoms No skin rashes + fatigue No myalgias + headache   Allergies  Review of patient's  allergies indicates not on file.  Home Medications   Current Outpatient Rx  Name Route Sig Dispense Refill  . ACETAMINOPHEN 500 MG PO TABS Oral Take 500 mg by mouth every 6 (six) hours as needed. pain    . ESOMEPRAZOLE MAGNESIUM 40 MG PO CPDR Oral Take 1 capsule (40 mg total) by mouth daily. 30 capsule 3  . ESOMEPRAZOLE MAGNESIUM 40 MG PO PACK Oral Take 40 mg by mouth daily before breakfast. 30 each 12  . METOCLOPRAMIDE HCL 10 MG PO TABS Oral Take 10 mg by mouth 3 (three) times daily.    Marland Kitchen ONDANSETRON HCL 4 MG PO TABS  1 to 2 tid prn nausea 40 tablet 1  . PRENATAL MULTIVITAMIN CH Oral Take 1 tablet by mouth daily.    Marland Kitchen PROMETHAZINE HCL 25 MG PO TABS  TAKE 1 TABLET (25 MG TOTAL) BY MOUTH EVERY 6 (SIX) HOURS AS NEEDED. 60 tablet 0    PATIENT IS REQUESTING ADDITIONAL REFILLS    BP 115/80  Pulse 92  Temp 98.1 F (36.7 C) (Oral)  Resp 18  Ht 4\' 11"  (1.499 m)  Wt 181 lb (82.101 kg)  BMI 36.56 kg/m2  SpO2 97%  LMP  04/19/2011  Physical Exam Nursing notes and Vital Signs reviewed. Appearance:  Patient appears healthy, stated age, and in no acute distress Eyes:  Pupils are equal, round, and reactive to light and accomodation.  Extraocular movement is intact.  Conjunctivae are not inflamed  Ears:  Canals normal.  Tympanic membranes normal.  Nose:  Mildly congested turbinates.  No sinus tenderness.    Pharynx:  Minimal erythema Neck:  Supple.  Tender shotty posterior nodes are palpated bilaterally  Lungs:  Clear to auscultation.  Breath sounds are equal.  Heart:  Regular rate and rhythm without murmurs, rubs, or gallops.  Abdomen:  Gravid, nontender without masses or hepatosplenomegaly.  Bowel sounds are present.  No CVA or flank tenderness.  Extremities:  No edema.  No calf tenderness Skin:  No rash present.   ED Course  Procedures  none  Labs Reviewed -  POCT Rapid strep test negative    1. Acute upper respiratory infections of unspecified site; viral URI       MDM  There  is no evidence of bacterial infection today.   Treat symptomatically for now: Check with OB for approved list of medications for use during a cold-like illness.  Increase fluid intake.  Use saline nasal spray. Followup with Family Doctor if not improved in one week.         Lattie Haw, MD 12/05/11 [phone]

## 2011-12-05 NOTE — ED Notes (Signed)
Sore throat, cough, congestion, green mucus x 2 days

## 2011-12-06 ENCOUNTER — Ambulatory Visit (INDEPENDENT_AMBULATORY_CARE_PROVIDER_SITE_OTHER): Payer: 59 | Admitting: Obstetrics and Gynecology

## 2011-12-06 ENCOUNTER — Other Ambulatory Visit: Payer: 59

## 2011-12-06 VITALS — BP 100/64 | Wt 180.0 lb

## 2011-12-06 DIAGNOSIS — Z23 Encounter for immunization: Secondary | ICD-10-CM

## 2011-12-06 DIAGNOSIS — Z3490 Encounter for supervision of normal pregnancy, unspecified, unspecified trimester: Secondary | ICD-10-CM

## 2011-12-06 DIAGNOSIS — Z331 Pregnant state, incidental: Secondary | ICD-10-CM

## 2011-12-06 LAB — CBC
MCH: 29 pg (ref 26.0–34.0)
MCV: 86.4 fL (ref 78.0–100.0)
Platelets: 219 10*3/uL (ref 150–400)
RDW: 13.2 % (ref 11.5–15.5)

## 2011-12-06 NOTE — Progress Notes (Signed)
[redacted]w[redacted]d  GFM Glucola today

## 2011-12-06 NOTE — Progress Notes (Signed)
Pt stated no issues today. Pt had got flu shot at work last week. Glucola given @ 1:34 draw @ 2:34.

## 2011-12-07 ENCOUNTER — Telehealth: Payer: Self-pay | Admitting: *Deleted

## 2011-12-11 ENCOUNTER — Ambulatory Visit (INDEPENDENT_AMBULATORY_CARE_PROVIDER_SITE_OTHER): Payer: 59 | Admitting: Physician Assistant

## 2011-12-11 VITALS — BP 123/81 | HR 106 | Temp 98.0°F | Resp 16 | Ht 60.0 in | Wt 183.0 lb

## 2011-12-11 DIAGNOSIS — Z3490 Encounter for supervision of normal pregnancy, unspecified, unspecified trimester: Secondary | ICD-10-CM

## 2011-12-11 DIAGNOSIS — J019 Acute sinusitis, unspecified: Secondary | ICD-10-CM

## 2011-12-11 MED ORDER — AMOXICILLIN 875 MG PO TABS: 875.0000 mg | ORAL_TABLET | Freq: Two times a day (BID) | ORAL | Status: DC
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
## 2011-12-20 ENCOUNTER — Encounter: Payer: 59 | Admitting: Obstetrics and Gynecology

## 2011-12-26 ENCOUNTER — Telehealth: Payer: Self-pay | Admitting: Obstetrics and Gynecology

## 2011-12-26 NOTE — Telephone Encounter (Signed)
Pt called, is 35 wk 0 days, states today has been having more vaginal pressure and pain.  Pt denies contractions, LOF/bleeding, does report + fm, pt also states she is on her feet all day while at work, only drinks about 1 liter of water/day, and does not get frequent breaks.  Pt offered an appt today, says unable to d/t work.  Advised pt to push fluids, eat regularly, monitor for contractions and fm, take Tylenol, when going home take a warm bath or shower and call back with any concerns.  Pt has an appt on Friday.

## 2011-12-28 ENCOUNTER — Ambulatory Visit (INDEPENDENT_AMBULATORY_CARE_PROVIDER_SITE_OTHER): Payer: 59 | Admitting: Obstetrics and Gynecology

## 2011-12-28 VITALS — BP 100/80 | Wt 189.0 lb

## 2011-12-28 DIAGNOSIS — Z331 Pregnant state, incidental: Secondary | ICD-10-CM

## 2011-12-28 NOTE — Progress Notes (Signed)
[redacted]w[redacted]d GBS at [place] Glucola normal @ 115 Still having pressure but not working 12 hr shifts.   Pt states she has had a couple episodes of hot flashes with palpitations and increased heart rate. Doesn't want cx ck today.

## 2011-12-28 NOTE — Progress Notes (Signed)
[redacted]w[redacted]d More pressure Labor signs reviewed

## 2012-01-03 ENCOUNTER — Telehealth: Payer: Self-pay | Admitting: Obstetrics and Gynecology

## 2012-01-04 ENCOUNTER — Ambulatory Visit (INDEPENDENT_AMBULATORY_CARE_PROVIDER_SITE_OTHER): Payer: 59 | Admitting: Obstetrics and Gynecology

## 2012-01-04 ENCOUNTER — Encounter: Payer: Self-pay | Admitting: Obstetrics and Gynecology

## 2012-01-04 VITALS — BP 102/70 | Wt 190.0 lb

## 2012-01-04 DIAGNOSIS — Z331 Pregnant state, incidental: Secondary | ICD-10-CM

## 2012-01-04 NOTE — Progress Notes (Signed)
[redacted]w[redacted]d  C/O: possible yeast infection GBS today.

## 2012-01-04 NOTE — Progress Notes (Signed)
[redacted]w[redacted]d Labor signs reviewed. GBS done

## 2012-01-10 ENCOUNTER — Encounter: Payer: Self-pay | Admitting: Obstetrics and Gynecology

## 2012-01-10 ENCOUNTER — Ambulatory Visit (INDEPENDENT_AMBULATORY_CARE_PROVIDER_SITE_OTHER): Payer: 59 | Admitting: Obstetrics and Gynecology

## 2012-01-10 VITALS — BP 102/72 | Wt 194.0 lb

## 2012-01-10 DIAGNOSIS — Z331 Pregnant state, incidental: Secondary | ICD-10-CM

## 2012-01-10 NOTE — Progress Notes (Signed)
Pt stated c/o swelling . Pt stated no other issues today.

## 2012-01-10 NOTE — Progress Notes (Signed)
[redacted]w[redacted]d GBS negative

## 2012-01-14 ENCOUNTER — Telehealth: Payer: Self-pay | Admitting: Obstetrics and Gynecology

## 2012-01-14 ENCOUNTER — Inpatient Hospital Stay (HOSPITAL_COMMUNITY)
Admission: AD | Admit: 2012-01-14 | Discharge: 2012-01-14 | Disposition: A | Discharge status: Home / Self Care | Payer: 59 | Source: Ambulatory Visit | Attending: Obstetrics and Gynecology | Admitting: Obstetrics and Gynecology

## 2012-01-14 ENCOUNTER — Encounter (HOSPITAL_COMMUNITY): Payer: Self-pay | Admitting: *Deleted

## 2012-01-14 ENCOUNTER — Other Ambulatory Visit: Payer: Self-pay | Admitting: Obstetrics and Gynecology

## 2012-01-14 DIAGNOSIS — O1200 Gestational edema, unspecified trimester: Secondary | ICD-10-CM | POA: Diagnosis present

## 2012-01-14 DIAGNOSIS — O139 Gestational [pregnancy-induced] hypertension without significant proteinuria, unspecified trimester: Secondary | ICD-10-CM

## 2012-01-14 DIAGNOSIS — O99891 Other specified diseases and conditions complicating pregnancy: Secondary | ICD-10-CM | POA: Insufficient documentation

## 2012-01-14 DIAGNOSIS — R51 Headache: Secondary | ICD-10-CM | POA: Insufficient documentation

## 2012-01-14 DIAGNOSIS — I10 Essential (primary) hypertension: Secondary | ICD-10-CM | POA: Diagnosis present

## 2012-01-14 DIAGNOSIS — R030 Elevated blood-pressure reading, without diagnosis of hypertension: Secondary | ICD-10-CM | POA: Insufficient documentation

## 2012-01-14 LAB — URIC ACID: Uric Acid, Serum: 3.4 mg/dL (ref 2.4–7.0)

## 2012-01-14 LAB — CBC
HCT: 31.7 % — ABNORMAL LOW (ref 36.0–46.0)
MCH: 27.7 pg (ref 26.0–34.0)
MCHC: 32.5 g/dL (ref 30.0–36.0)
MCV: 85.2 fL (ref 78.0–100.0)
RDW: 13.6 % (ref 11.5–15.5)

## 2012-01-14 LAB — COMPREHENSIVE METABOLIC PANEL
Albumin: 2.3 g/dL — ABNORMAL LOW (ref 3.5–5.2)
BUN: 9 mg/dL (ref 6–23)
Calcium: 8.9 mg/dL (ref 8.4–10.5)
Creatinine, Ser: 0.5 mg/dL (ref 0.50–1.10)
Total Bilirubin: 0.2 mg/dL — ABNORMAL LOW (ref 0.3–1.2)
Total Protein: 6.2 g/dL (ref 6.0–8.3)

## 2012-01-14 LAB — URINALYSIS, ROUTINE W REFLEX MICROSCOPIC
Bilirubin Urine: NEGATIVE
Nitrite: NEGATIVE
Specific Gravity, Urine: 1.015 (ref 1.005–1.030)
Urobilinogen, UA: 0.2 mg/dL (ref 0.0–1.0)

## 2012-01-14 LAB — LACTATE DEHYDROGENASE: LDH: 173 U/L (ref 94–250)

## 2012-01-14 NOTE — MAU Note (Signed)
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  Called [facility], was told to come to MAU.

## 2012-01-14 NOTE — Telephone Encounter (Signed)
TC to pt. States has had headache on and off x 5 days. Was more severe today. BP at work was 124/80-140/98. No relief of headache with Tylenol. Has same edema of legs as last week.  Had difficulty with vision 01/11/12.  +FM Irreg contractions. Per VL pt to MAU.

## 2012-01-14 NOTE — MAU Provider Note (Signed)
History   31 yo G1P0 at 52 5/7 weeks presented after calling office c/o HA x several days, with BP at work today 130s-140s/80s-90s.  Had one episode on Friday of "flashing light" in one eye, but denies epigastric pain. Has had moderate swelling for last several weeks, with left > right.  Denies UCs, reports +FM.  Patient Active Problem List  Diagnosis  . Migraines  . Flu vaccine need     Chief Complaint  Patient presents with  . Hypertension      OB History    Grav Para Term Preterm Abortions TAB SAB Ect Mult Living   1 0 0 0 0 0 0 0 0 0       Past Medical History  Diagnosis Date  . Migraines     MIGRAINES;HAS TAKEN MAXALT AND VICODIN IN THE PAST OR CAFFEINE  . Depression     HAS TAKEN LEXAPRO 40MG  DAILY BEFORE PREGNANCY;WEANED SELF OFF  . Infection     YEAST INF;TYPICALLY AFTER ANTIBXS;NOT FREQ  . Abnormal Pap smear 2005    PRE-CANCEROUS CELLS ON CX;COLPO DONE AND CELLS "BURNED OFF";LAST PAP 2010 OR 2011,WAS NORMAL  . Anginal pain     Past Surgical History  Procedure Date  . Wisdom tooth extraction 1998    ALL 4 EXTRACTED  . Colposcopy     Family History  Problem Relation Age of Onset  . Migraines Father     IN THE PAST  . Migraines Paternal Grandfather   . Thyroid disease Mother   . Alzheimer's disease Father     DX'D @ 48    History  Substance Use Topics  . Smoking status: Never Smoker   . Smokeless tobacco: Never Used  . Alcohol Use: No     Comment: RARELY    Allergies: No Known Allergies  Prescriptions prior to admission  Medication Sig Dispense Refill  . acetaminophen (TYLENOL) 500 MG tablet Take 500 mg by mouth every 6 (six) hours as needed. pain      . esomeprazole (NEXIUM) 40 MG capsule Take 1 capsule (40 mg total) by mouth daily.  30 capsule  3  . ondansetron (ZOFRAN) 4 MG tablet 1 to 2 tid prn nausea  40 tablet  1  . Prenatal Vit-Fe Fumarate-FA (PRENATAL MULTIVITAMIN) TABS Take 1 tablet by mouth daily.      . promethazine (PHENERGAN) 25  MG tablet TAKE 1 TABLET (25 MG TOTAL) BY MOUTH EVERY 6 (SIX) HOURS AS NEEDED.  60 tablet  0  . ranitidine (ZANTAC) 150 MG tablet Take 150 mg by mouth daily as needed. heartburn         Physical Exam   Blood pressure 126/89, pulse 84, resp. rate 18, last menstrual period 04/19/2011.  Chest clear Heart RRR without murmur Abd gravid, NT Pelvic--deferred Ext DTR 1+ without clonus, 2+ pitting edema in LE (left slightly > right)  FHR reactive, no decels Occasional mild contractions  ED Course  IUP at 37 5/7 weeks Mild elevation of BP  Plan: Await labs and serial BPs.   Nigel Bridgeman CNM, [place] 01/14/2012 5:02 PM   Addendum:  Ceasar Mons Vitals:   01/14/12 1700 01/14/12 1715 01/14/12 1730 01/14/12 1745  BP: 126/89 122/73 126/82 120/76  Pulse: 84 69 65 71  Resp: 18       Results for orders placed during the hospital encounter of 01/14/12 (from the past 24 hour(s))  URINALYSIS, ROUTINE W REFLEX MICROSCOPIC     Status: Normal   Collection Time  01/14/12  4:25 PM      Component Value Range   Color, Urine YELLOW  YELLOW   APPearance CLEAR  CLEAR   Specific Gravity, Urine 1.015  1.005 - 1.030   pH 6.0  5.0 - 8.0   Glucose, UA NEGATIVE  NEGATIVE mg/dL   Hgb urine dipstick NEGATIVE  NEGATIVE   Bilirubin Urine NEGATIVE  NEGATIVE   Ketones, ur NEGATIVE  NEGATIVE mg/dL   Protein, ur NEGATIVE  NEGATIVE mg/dL   Urobilinogen, UA 0.2  0.0 - 1.0 mg/dL   Nitrite NEGATIVE  NEGATIVE   Leukocytes, UA NEGATIVE  NEGATIVE  CBC     Status: Abnormal   Collection Time   01/14/12  4:35 PM      Component Value Range   WBC 11.1 (*) 4.0 - 10.5 K/uL   RBC 3.72 (*) 3.87 - 5.11 MIL/uL   Hemoglobin 10.3 (*) 12.0 - 15.0 g/dL   HCT 16.1 (*) 09.6 - 04.5 %   MCV 85.2  78.0 - 100.0 fL   MCH 27.7  26.0 - 34.0 pg   MCHC 32.5  30.0 - 36.0 g/dL   RDW 40.9  81.1 - 91.4 %   Platelets 183  150 - 400 K/uL  COMPREHENSIVE METABOLIC PANEL     Status: Abnormal   Collection Time   01/14/12  4:35 PM       Component Value Range   Sodium 136  135 - 145 mEq/L   Potassium 3.6  3.5 - 5.1 mEq/L   Chloride 103  96 - 112 mEq/L   CO2 22  19 - 32 mEq/L   Glucose, Bld 90  70 - 99 mg/dL   BUN 9  6 - 23 mg/dL   Creatinine, Ser 7.82  0.50 - 1.10 mg/dL   Calcium 8.9  8.4 - 95.6 mg/dL   Total Protein 6.2  6.0 - 8.3 g/dL   Albumin 2.3 (*) 3.5 - 5.2 g/dL   AST 17  0 - 37 U/L   ALT 14  0 - 35 U/L   Alkaline Phosphatase 164 (*) 39 - 117 U/L   Total Bilirubin 0.2 (*) 0.3 - 1.2 mg/dL   GFR calc non Af Amer >90  >90 mL/min   GFR calc Af Amer >90  >90 mL/min  LACTATE DEHYDROGENASE     Status: Normal   Collection Time   01/14/12  4:35 PM      Component Value Range   LDH 173  94 - 250 U/L  URIC ACID     Status: Normal   Collection Time   01/14/12  4:35 PM      Component Value Range   Uric Acid, Serum 3.4  2.4 - 7.0 mg/dL   D/C'd home with PIH precautions. To do 24 hour urine from 11/26-11/27, and return to [facility] for appt at 8:30am and recheck of BP with VL. To be OOW these days, with increase rest and to push fluids.  Nigel Bridgeman, CNM 01/14/12 6:10p

## 2012-01-15 ENCOUNTER — Other Ambulatory Visit: Payer: 59

## 2012-01-15 ENCOUNTER — Telehealth: Payer: Self-pay | Admitting: Obstetrics and Gynecology

## 2012-01-15 ENCOUNTER — Other Ambulatory Visit: Payer: Self-pay | Admitting: Obstetrics and Gynecology

## 2012-01-15 DIAGNOSIS — O10019 Pre-existing essential hypertension complicating pregnancy, unspecified trimester: Secondary | ICD-10-CM

## 2012-01-15 NOTE — Telephone Encounter (Signed)
TC to pt. States was seen AT MAU 01/14/12.  Was told to call if sx recurred.  States when she took a shower this am had "glitter spots" in her vision.  Headache recurred at noon. +FM. No cont Will consult with VL  whe arrives and office and call pt.. Pt agreeable.  To rest and maintain fluids.

## 2012-01-15 NOTE — Telephone Encounter (Signed)
TC to pt. Per VL, advised rest, maintain fluids, may take Tylenol.   To call with any change or increase in sx.  Keep appt 01/16/12. Pt verbalizes comprehension.

## 2012-01-16 ENCOUNTER — Other Ambulatory Visit: Payer: 59

## 2012-01-16 ENCOUNTER — Ambulatory Visit (INDEPENDENT_AMBULATORY_CARE_PROVIDER_SITE_OTHER): Payer: 59 | Admitting: Obstetrics and Gynecology

## 2012-01-16 ENCOUNTER — Other Ambulatory Visit: Payer: Self-pay

## 2012-01-16 VITALS — BP 120/82 | Wt 196.0 lb

## 2012-01-16 DIAGNOSIS — O139 Gestational [pregnancy-induced] hypertension without significant proteinuria, unspecified trimester: Secondary | ICD-10-CM

## 2012-01-16 DIAGNOSIS — O10019 Pre-existing essential hypertension complicating pregnancy, unspecified trimester: Secondary | ICD-10-CM

## 2012-01-16 LAB — CREATININE, URINE, 24 HOUR: Creatinine, 24H Ur: 1293 mg/d (ref 700–1800)

## 2012-01-16 LAB — PROTEIN, URINE, 24 HOUR

## 2012-01-16 NOTE — Addendum Note (Signed)
Addended by: Stephens Shire on: 01/16/2012 09:37 AM   Modules accepted: Orders

## 2012-01-16 NOTE — Addendum Note (Signed)
Addended by: Stephens Shire on: 01/16/2012 09:42 AM   Modules accepted: Orders

## 2012-01-16 NOTE — Progress Notes (Signed)
Pt stated she is still having headaches. Pt wants a cervix check today.no other issues today.

## 2012-01-16 NOTE — Addendum Note (Signed)
Addended by: Stephens Shire on: 01/16/2012 09:46 AM   Modules accepted: Orders

## 2012-01-16 NOTE — Progress Notes (Signed)
Here to turn in 24 hour urine and recheck BP from MAU visit 2 days ago-BPs were 130-140s/80s, negative PIH w/u. Still has significant pedal edema, 2+, normal reflexes. HA still occurring--hx migraines in past.  No visual sx. Cervix FT, 50%, vtx, -1. Will await 24 hour urine results.  Will f/u with pt today re: results. Now OOW.  Note written.

## 2012-01-16 NOTE — Addendum Note (Signed)
Addended by: Stephens Shire on: 01/16/2012 09:27 AM   Modules accepted: Orders

## 2012-01-21 ENCOUNTER — Ambulatory Visit (INDEPENDENT_AMBULATORY_CARE_PROVIDER_SITE_OTHER): Payer: 59 | Admitting: Obstetrics and Gynecology

## 2012-01-21 ENCOUNTER — Encounter: Payer: Self-pay | Admitting: Obstetrics and Gynecology

## 2012-01-21 VITALS — BP 126/82 | Wt 195.0 lb

## 2012-01-21 DIAGNOSIS — Z331 Pregnant state, incidental: Secondary | ICD-10-CM

## 2012-01-21 DIAGNOSIS — Z3490 Encounter for supervision of normal pregnancy, unspecified, unspecified trimester: Secondary | ICD-10-CM

## 2012-01-21 MED ORDER — PROMETHAZINE HCL 25 MG PO TABS: 25.0000 mg | ORAL_TABLET | Freq: Four times a day (QID) | ORAL | Status: DC | PRN

## 2012-01-21 NOTE — Progress Notes (Signed)
[redacted]w[redacted]d 24hr urine was neg FKCs and Labor Precautions RTO 1wk

## 2012-01-25 ENCOUNTER — Telehealth: Payer: Self-pay | Admitting: Obstetrics and Gynecology

## 2012-01-25 ENCOUNTER — Encounter (HOSPITAL_COMMUNITY): Payer: Self-pay

## 2012-01-25 ENCOUNTER — Inpatient Hospital Stay (HOSPITAL_COMMUNITY)
Admission: AD | Admit: 2012-01-25 | Discharge: 2012-01-25 | Disposition: A | Discharge status: Home / Self Care | Payer: 59 | Source: Ambulatory Visit | Attending: Obstetrics and Gynecology | Admitting: Obstetrics and Gynecology

## 2012-01-25 DIAGNOSIS — O479 False labor, unspecified: Secondary | ICD-10-CM

## 2012-01-25 DIAGNOSIS — O139 Gestational [pregnancy-induced] hypertension without significant proteinuria, unspecified trimester: Secondary | ICD-10-CM

## 2012-01-25 DIAGNOSIS — O1200 Gestational edema, unspecified trimester: Secondary | ICD-10-CM

## 2012-01-25 DIAGNOSIS — I10 Essential (primary) hypertension: Secondary | ICD-10-CM

## 2012-01-25 NOTE — MAU Note (Signed)
Notified CNM BP readings patient here for labor evaluation, per CNM RN will check her cervix and call back with results.

## 2012-01-25 NOTE — Telephone Encounter (Signed)
TC from pt. States having painful cont q 5 min x 1 1/2 hours.  ? FM ADvised to go to MAU.  Pt verbalizes comprehension.

## 2012-01-25 NOTE — Telephone Encounter (Signed)
Pt called and states has been having contractions for the past 3 days.  Contractions will get close and them back off.  Also noticed mucus plug expelling.  Pt reports +fm, no bldg.  Pt advised to continue monitoring contractions, once they become regular, 3-5 minutes apart consistently without backing off and more intense to make sure to call.  Also advised to stay hydrated and rest.  Pt voices agreement.

## 2012-01-25 NOTE — MAU Note (Signed)
Patient states she has been having contractions every 5 minutes. Denies leaking or bleeding and reports good fetal movement.

## 2012-01-27 ENCOUNTER — Encounter (HOSPITAL_COMMUNITY): Payer: Self-pay | Admitting: Anesthesiology

## 2012-01-27 ENCOUNTER — Encounter (HOSPITAL_COMMUNITY): Payer: Self-pay | Admitting: *Deleted

## 2012-01-27 ENCOUNTER — Inpatient Hospital Stay (HOSPITAL_COMMUNITY): Payer: 59 | Admitting: Anesthesiology

## 2012-01-27 ENCOUNTER — Inpatient Hospital Stay (HOSPITAL_COMMUNITY)
Admission: AD | Admit: 2012-01-27 | Discharge: 2012-01-31 | DRG: 765 | Disposition: A | Discharge status: Home / Self Care | Payer: 59 | Source: Ambulatory Visit | Attending: Obstetrics and Gynecology | Admitting: Obstetrics and Gynecology

## 2012-01-27 DIAGNOSIS — E669 Obesity, unspecified: Secondary | ICD-10-CM | POA: Diagnosis present

## 2012-01-27 DIAGNOSIS — O9903 Anemia complicating the puerperium: Secondary | ICD-10-CM | POA: Diagnosis not present

## 2012-01-27 DIAGNOSIS — D649 Anemia, unspecified: Secondary | ICD-10-CM

## 2012-01-27 DIAGNOSIS — I10 Essential (primary) hypertension: Secondary | ICD-10-CM

## 2012-01-27 DIAGNOSIS — O139 Gestational [pregnancy-induced] hypertension without significant proteinuria, unspecified trimester: Secondary | ICD-10-CM | POA: Diagnosis present

## 2012-01-27 DIAGNOSIS — Z98891 History of uterine scar from previous surgery: Secondary | ICD-10-CM | POA: Diagnosis not present

## 2012-01-27 DIAGNOSIS — O1200 Gestational edema, unspecified trimester: Secondary | ICD-10-CM

## 2012-01-27 LAB — COMPREHENSIVE METABOLIC PANEL
ALT: 14 U/L (ref 0–35)
Albumin: 2.4 g/dL — ABNORMAL LOW (ref 3.5–5.2)
Alkaline Phosphatase: 175 U/L — ABNORMAL HIGH (ref 39–117)
Potassium: 4.1 mEq/L (ref 3.5–5.1)
Sodium: 135 mEq/L (ref 135–145)
Total Protein: 6.2 g/dL (ref 6.0–8.3)

## 2012-01-27 LAB — CBC
HCT: 32.6 % — ABNORMAL LOW (ref 36.0–46.0)
HCT: 33.1 % — ABNORMAL LOW (ref 36.0–46.0)
Hemoglobin: 10.7 g/dL — ABNORMAL LOW (ref 12.0–15.0)
MCHC: 32.2 g/dL (ref 30.0–36.0)
MCHC: 32.3 g/dL (ref 30.0–36.0)
MCV: 84 fL (ref 78.0–100.0)
RBC: 3.94 MIL/uL (ref 3.87–5.11)
RDW: 14.4 % (ref 11.5–15.5)

## 2012-01-27 MED ORDER — HYDROXYZINE HCL 50 MG PO TABS: 50.0000 mg | ORAL_TABLET | Freq: Four times a day (QID) | ORAL | Status: DC | PRN

## 2012-01-27 MED ORDER — OXYCODONE-ACETAMINOPHEN 5-325 MG PO TABS: 1.0000 | ORAL_TABLET | ORAL | Status: DC | PRN

## 2012-01-27 MED ORDER — EPHEDRINE 5 MG/ML INJ: 10.0000 mg | INTRAVENOUS | Status: DC | PRN

## 2012-01-27 MED ORDER — FENTANYL 2.5 MCG/ML BUPIVACAINE 1/10 % EPIDURAL INFUSION (WH - ANES)
14.0000 mL/h | INTRAMUSCULAR | Status: DC
  Administered 2012-01-28: 14 mL/h via EPIDURAL
  Filled 2012-01-27 (×2): qty 125

## 2012-01-27 MED ORDER — IBUPROFEN 600 MG PO TABS: 600.0000 mg | ORAL_TABLET | Freq: Four times a day (QID) | ORAL | Status: DC | PRN

## 2012-01-27 MED ORDER — LACTATED RINGERS IV SOLN: INTRAVENOUS | Status: DC

## 2012-01-27 MED ORDER — FENTANYL 2.5 MCG/ML BUPIVACAINE 1/10 % EPIDURAL INFUSION (WH - ANES)
INTRAMUSCULAR | Status: DC | PRN
  Administered 2012-01-27: 12 mL/h via EPIDURAL

## 2012-01-27 MED ORDER — OXYTOCIN 40 UNITS IN LACTATED RINGERS INFUSION - SIMPLE MED: 62.5000 mL/h | INTRAVENOUS | Status: DC

## 2012-01-27 MED ORDER — DIPHENHYDRAMINE HCL 50 MG/ML IJ SOLN: 12.5000 mg | INTRAMUSCULAR | Status: DC | PRN

## 2012-01-27 MED ORDER — CITRIC ACID-SODIUM CITRATE 334-500 MG/5ML PO SOLN
30.0000 mL | ORAL | Status: DC | PRN
  Administered 2012-01-28: 30 mL via ORAL
  Filled 2012-01-27: qty 15

## 2012-01-27 MED ORDER — PHENYLEPHRINE 40 MCG/ML (10ML) SYRINGE FOR IV PUSH (FOR BLOOD PRESSURE SUPPORT): 80.0000 ug | PREFILLED_SYRINGE | INTRAVENOUS | Status: DC | PRN

## 2012-01-27 MED ORDER — LACTATED RINGERS IV SOLN: 500.0000 mL | Freq: Once | INTRAVENOUS | Status: DC

## 2012-01-27 MED ORDER — EPHEDRINE 5 MG/ML INJ
10.0000 mg | INTRAVENOUS | Status: DC | PRN
Start: 2012-01-27 — End: 2012-01-28
  Filled 2012-01-27: qty 4

## 2012-01-27 MED ORDER — LIDOCAINE HCL (PF) 1 % IJ SOLN
INTRAMUSCULAR | Status: DC | PRN
  Administered 2012-01-27 (×2): 4 mL

## 2012-01-27 MED ORDER — LACTATED RINGERS IV SOLN: 500.0000 mL | INTRAVENOUS | Status: DC | PRN

## 2012-01-27 MED ORDER — OXYTOCIN BOLUS FROM INFUSION: 500.0000 mL | INTRAVENOUS | Status: DC

## 2012-01-27 MED ORDER — PHENYLEPHRINE 40 MCG/ML (10ML) SYRINGE FOR IV PUSH (FOR BLOOD PRESSURE SUPPORT)
80.0000 ug | PREFILLED_SYRINGE | INTRAVENOUS | Status: DC | PRN
  Filled 2012-01-27: qty 5

## 2012-01-27 MED ORDER — LIDOCAINE HCL (PF) 1 % IJ SOLN: 30.0000 mL | INTRAMUSCULAR | Status: DC | PRN

## 2012-01-27 MED ORDER — FENTANYL CITRATE 0.05 MG/ML IJ SOLN: 100.0000 ug | INTRAMUSCULAR | Status: DC | PRN

## 2012-01-27 MED ORDER — HYDROXYZINE HCL 50 MG/ML IM SOLN
50.0000 mg | Freq: Four times a day (QID) | INTRAMUSCULAR | Status: DC | PRN
  Filled 2012-01-27: qty 1

## 2012-01-27 MED ORDER — ONDANSETRON HCL 4 MG/2ML IJ SOLN: 4.0000 mg | Freq: Four times a day (QID) | INTRAMUSCULAR | Status: DC | PRN

## 2012-01-27 MED ORDER — ACETAMINOPHEN 325 MG PO TABS: 650.0000 mg | ORAL_TABLET | ORAL | Status: DC | PRN

## 2012-01-27 NOTE — H&P (Signed)
[name] [name] is a 31 y.o. female presenting for r/o ROM w/ CC of underwear feeling wet/damp all day. Contractions have persisted for last 5 days, but are stronger this evening.  Last 24 hours the ctxs have been between 5 and 30 minutes, and pt has slept little in past few days.  Seen in MAU Friday afternoon and d/c'd home.  Rx'd Flexeril for pt when she was d/c'd Friday night.  Denies gushes of fluid.  No VB.  GFM.  No recent illness or fever.  Did c/o loose stools today.  Denies PIH s/s today; recalls HA on Friday.  No UTI s/s.  Accompanied by her s.o. "Scott" and her dad.   Prenatal course: Pt started care with [facility] and transferred to [facility] at [redacted]w[redacted]d.  Pt seen in MAU at 11 weeks for RLQ pain and had renal and abdominal u/s to r/o kidney stones and appendicitis.  Declined aneuploidy screening.  C/o dyspareunia at [redacted]w[redacted]d.  Normal anatomy scan at [redacted]w[redacted]d; no u/s since.  Pt has done 3 gtt's and all WNL.  FFN done at [redacted]w[redacted]d for suprapubic pain and pelvic pressure and negative.  Pt received flu shot 11/27/11 around 31 weeks.  LE edema prominent over the last 1-2 months, and pt decreased work shift from 12hr shifts.  Pt had PIH w/u 01/14/12 for BP in 130s-140s/80s and labs normal.  24 hr urine done on 01/16/12 and 3mg  total protein; pt written OOW at that time and placed on modified BR secondary to BP.  Pt has not been on BP medicine.    Maternal Medical History:  Reason for admission: Reason for admission: rupture of membranes.  Contractions: Onset was more than 2 days ago.   Frequency: regular.   Perceived severity is strong.    Fetal activity: Perceived fetal activity is normal.   Last perceived fetal movement was within the past hour.    Prenatal complications: Hypertension.     OB History    Grav Para Term Preterm Abortions TAB SAB Ect Mult Living   1 0 0 0 0 0 0 0 0 0      Past Medical History  Diagnosis Date  . Migraines     MIGRAINES;HAS TAKEN MAXALT AND VICODIN IN THE PAST OR  CAFFEINE  . Depression     HAS TAKEN LEXAPRO 40MG  DAILY BEFORE PREGNANCY;WEANED SELF OFF  . Infection     YEAST INF;TYPICALLY AFTER ANTIBXS;NOT FREQ  . Abnormal Pap smear 2005    PRE-CANCEROUS CELLS ON CX;COLPO DONE AND CELLS "BURNED OFF";LAST PAP 2010 OR 2011,WAS NORMAL   Past Surgical History  Procedure Date  . Wisdom tooth extraction 1998    ALL 4 EXTRACTED  . Colposcopy   . Cryotherapy    Family History: family history includes Alzheimer's disease in her father; Migraines in her father and paternal grandfather; and Thyroid disease in her mother. Social History:  reports that she has never smoked. She has never used smokeless tobacco. She reports that she does not drink alcohol or use illicit drugs.   Prenatal Transfer Tool  Maternal Diabetes: No Genetic Screening: Declined Maternal Ultrasounds/Referrals: Normal Fetal Ultrasounds or other Referrals:  None Maternal Substance Abuse:  No Significant Maternal Medications:  None Significant Maternal Lab Results:  Lab values include: Group B Strep negative Other Comments:  None  Review of Systems  Constitutional: Negative.   HENT: Positive for congestion.   Eyes: Negative.   Respiratory: Negative.   Cardiovascular: Negative.   Gastrointestinal: Positive for diarrhea.  Genitourinary: Negative.   Musculoskeletal: Negative.   Skin: Negative.   Neurological: Negative.     Dilation: 1 Effacement (%): 100 Station: -2 Exam by:: H. Joane Postel CNM Blood pressure 120/80, pulse 82, last menstrual period 04/19/2011. Maternal Exam:  Uterine Assessment: Contraction strength is moderate.  Contraction frequency is irregular.   Abdomen: Patient reports no abdominal tenderness. Introitus: Normal vulva. Ferning test: positive.  Nitrazine test: not done. Amniotic fluid character: clear.  Pelvis: adequate for delivery.   Cervix: Cervix evaluated by digital exam.     Fetal Exam Fetal Monitor Review: Mode: ultrasound.   Baseline rate:  150.  Variability: moderate (6-25 bpm).   Pattern: accelerations present and no decelerations.      .. Results for orders placed during the hospital encounter of 01/27/12 (from the past 24 hour(s))  COMPREHENSIVE METABOLIC PANEL     Status: Abnormal   Collection Time   01/27/12  9:49 PM      Component Value Range   Sodium 135  135 - 145 mEq/L   Potassium 4.1  3.5 - 5.1 mEq/L   Chloride 102  96 - 112 mEq/L   CO2 21  19 - 32 mEq/L   Glucose, Bld 87  70 - 99 mg/dL   BUN 11  6 - 23 mg/dL   Creatinine, Ser 1.61  0.50 - 1.10 mg/dL   Calcium 9.4  8.4 - 09.6 mg/dL   Total Protein 6.2  6.0 - 8.3 g/dL   Albumin 2.4 (*) 3.5 - 5.2 g/dL   AST 20  0 - 37 U/L   ALT 14  0 - 35 U/L   Alkaline Phosphatase 175 (*) 39 - 117 U/L   Total Bilirubin 0.2 (*) 0.3 - 1.2 mg/dL   GFR calc non Af Amer >90  >90 mL/min   GFR calc Af Amer >90  >90 mL/min  CBC     Status: Abnormal   Collection Time   01/27/12  9:49 PM      Component Value Range   WBC 14.4 (*) 4.0 - 10.5 K/uL   RBC 3.88  3.87 - 5.11 MIL/uL   Hemoglobin 10.5 (*) 12.0 - 15.0 g/dL   HCT 04.5 (*) 40.9 - 81.1 %   MCV 84.0  78.0 - 100.0 fL   MCH 27.1  26.0 - 34.0 pg   MCHC 32.2  30.0 - 36.0 g/dL   RDW 91.4  78.2 - 95.6 %   Platelets 218  150 - 400 K/uL  LACTATE DEHYDROGENASE     Status: Abnormal   Collection Time   01/27/12  9:49 PM      Component Value Range   LDH 270 (*) 94 - 250 U/L  URIC ACID     Status: Normal   Collection Time   01/27/12  9:49 PM      Component Value Range   Uric Acid, Serum 4.4  2.4 - 7.0 mg/dL  AMNISURE RUPTURE OF MEMBRANE (ROM)     Status: Normal   Collection Time   01/27/12 10:05 PM      Component Value Range   Amnisure ROM NEGATIVE    POCT FERN TEST     Status: Normal   Collection Time   01/27/12 10:51 PM      Component Value Range   POCT Fern Test Positive = ruptured amniotic membanes    CBC     Status: Abnormal   Collection Time   01/27/12 11:30 PM      Component Value  Range   WBC 15.9 (*) 4.0 - 10.5  K/uL   RBC 3.94  3.87 - 5.11 MIL/uL   Hemoglobin 10.7 (*) 12.0 - 15.0 g/dL   HCT 40.9 (*) 81.1 - 91.4 %   MCV 84.0  78.0 - 100.0 fL   MCH 27.2  26.0 - 34.0 pg   MCHC 32.3  30.0 - 36.0 g/dL   RDW 78.2  95.6 - 21.3 %   Platelets 232  150 - 400 K/uL  RPR     Status: Normal   Collection Time   01/27/12 11:30 PM      Component Value Range   RPR NON REACTIVE  NON REACTIVE  ABO/RH     Status: Normal   Collection Time   01/27/12 11:30 PM      Component Value Range   ABO/RH(D) O POS    URINALYSIS, MICROSCOPIC ONLY     Status: Abnormal   Collection Time   01/28/12 12:31 AM      Component Value Range   Color, Urine YELLOW  YELLOW   APPearance CLEAR  CLEAR   Specific Gravity, Urine 1.025  1.005 - 1.030   pH 5.5  5.0 - 8.0   Glucose, UA NEGATIVE  NEGATIVE mg/dL   Hgb urine dipstick TRACE (*) NEGATIVE   Bilirubin Urine NEGATIVE  NEGATIVE   Ketones, ur 15 (*) NEGATIVE mg/dL   Protein, ur NEGATIVE  NEGATIVE mg/dL   Urobilinogen, UA 0.2  0.0 - 1.0 mg/dL   Nitrite NEGATIVE  NEGATIVE   Leukocytes, UA NEGATIVE  NEGATIVE   WBC, UA 0-2  <3 WBC/hpf   RBC / HPF 0-2  <3 RBC/hpf   Squamous Epithelial / LPF RARE  RARE  TYPE AND SCREEN     Status: Normal   Collection Time   01/28/12  2:16 AM      Component Value Range   ABO/RH(D) O POS     Antibody Screen NEG     Sample Expiration 01/31/2012     Physical Exam  Constitutional: She is oriented to person, place, and time. She appears well-developed and well-nourished. She appears distressed.  HENT:  Head: Normocephalic and atraumatic.  Eyes: Pupils are equal, round, and reactive to light.  Cardiovascular: Normal rate.   Respiratory: Effort normal.  GI: Soft.       gravid  Genitourinary:       1/100/-2, mid to anterior position  Musculoskeletal: She exhibits edema.       2+ BLE edema, pitting  Neurological: She is alert and oriented to person, place, and time. She has normal reflexes.       No clonus  Skin: Skin is warm and dry.   Psychiatric: She has a normal mood and affect. Her behavior is normal. Judgment and thought content normal.    Prenatal labs: ABO, Rh: O/POS/-- (06/17 1112) Antibody: NEG (06/17 1112) Rubella: 91.4 (06/17 1112) RPR: NON REAC (10/17 1356)  HBsAg: NEGATIVE (06/17 1112)  HIV: NON REACTIVE (06/17 1112)  GBS: NEGATIVE (11/15 1158)  Pap negative 08/13/11 1hr gtt 07/03/11=64 Repeat gtt x2=115  Assessment/Plan: 1. [redacted]w[redacted]d 2. SROm while in MAU around 2235 3. PIH 4. GBS neg 5. Prodromal labor for past 5 days 6. Obese and short stature 7. Cat I FHT  1. Admit to [facility] w/ Dr. Su Hilt as attending 2.  Routine L&D orders  3. Epidural ASAP 4. Start 24 hr urine w/ indwelling foley once epidural placed; sent u/a from first specimen 5.  IUPC/Pitocin prn augmentation  6. C/w MD prn, and CTO BP closely  Caleigha Zale H 01/27/2012, 11:04 PM

## 2012-01-27 NOTE — Anesthesia Preprocedure Evaluation (Signed)
Anesthesia Evaluation  Patient identified by MRN, date of birth, ID band Patient awake    Reviewed: Allergy & Precautions, H&P , Patient's Chart, lab work & pertinent test results  Airway Mallampati: III TM Distance: >3 FB Neck ROM: full    Dental No notable dental hx. (+) Teeth Intact   Pulmonary neg pulmonary ROS,  breath sounds clear to auscultation  Pulmonary exam normal       Cardiovascular hypertension, Rhythm:regular Rate:Normal     Neuro/Psych  Headaches, PSYCHIATRIC DISORDERS Depression negative neurological ROS  negative psych ROS   GI/Hepatic Neg liver ROS, GERD-  Medicated and Controlled,  Endo/Other  Morbid obesity  Renal/GU negative Renal ROS  negative genitourinary   Musculoskeletal   Abdominal Normal abdominal exam  (+)   Peds  Hematology negative hematology ROS (+)   Anesthesia Other Findings   Reproductive/Obstetrics (+) Pregnancy                           Anesthesia Physical Anesthesia Plan  ASA: III  Anesthesia Plan: Epidural   Post-op Pain Management:    Induction:   Airway Management Planned:   Additional Equipment:   Intra-op Plan:   Post-operative Plan:   Informed Consent: I have reviewed the patients History and Physical, chart, labs and discussed the procedure including the risks, benefits and alternatives for the proposed anesthesia with the patient or authorized representative who has indicated his/her understanding and acceptance.     Plan Discussed with: Anesthesiologist  Anesthesia Plan Comments:         Anesthesia Quick Evaluation

## 2012-01-28 ENCOUNTER — Encounter (HOSPITAL_COMMUNITY)
Admission: AD | Disposition: A | Discharge status: Home / Self Care | Payer: Self-pay | Source: Ambulatory Visit | Attending: Obstetrics and Gynecology

## 2012-01-28 ENCOUNTER — Encounter (HOSPITAL_COMMUNITY): Payer: Self-pay | Admitting: Anesthesiology

## 2012-01-28 ENCOUNTER — Encounter (HOSPITAL_COMMUNITY): Payer: Self-pay | Admitting: *Deleted

## 2012-01-28 ENCOUNTER — Inpatient Hospital Stay (HOSPITAL_COMMUNITY): Payer: 59 | Admitting: Anesthesiology

## 2012-01-28 LAB — URINALYSIS, MICROSCOPIC ONLY
Glucose, UA: NEGATIVE mg/dL
Leukocytes, UA: NEGATIVE
Nitrite: NEGATIVE
Specific Gravity, Urine: 1.025 (ref 1.005–1.030)
pH: 5.5 (ref 5.0–8.0)

## 2012-01-28 LAB — CBC
HCT: 28.8 % — ABNORMAL LOW (ref 36.0–46.0)
Hemoglobin: 9.3 g/dL — ABNORMAL LOW (ref 12.0–15.0)
MCH: 27.2 pg (ref 26.0–34.0)
MCHC: 32.3 g/dL (ref 30.0–36.0)

## 2012-01-28 LAB — TYPE AND SCREEN: ABO/RH(D): O POS

## 2012-01-28 LAB — RPR: RPR Ser Ql: NONREACTIVE

## 2012-01-28 SURGERY — Surgical Case
Anesthesia: Regional

## 2012-01-28 MED ORDER — OXYTOCIN 10 UNIT/ML IJ SOLN
40.0000 [IU] | INTRAVENOUS | Status: DC | PRN
  Administered 2012-01-28: 40 [IU] via INTRAVENOUS

## 2012-01-28 MED ORDER — FERROUS SULFATE 325 (65 FE) MG PO TABS
325.0000 mg | ORAL_TABLET | Freq: Two times a day (BID) | ORAL | Status: DC
  Administered 2012-01-29 – 2012-01-31 (×4): 325 mg via ORAL
  Filled 2012-01-28 (×4): qty 1

## 2012-01-28 MED ORDER — SODIUM CHLORIDE 0.9 % IJ SOLN: 3.0000 mL | INTRAMUSCULAR | Status: DC | PRN

## 2012-01-28 MED ORDER — NALBUPHINE HCL 10 MG/ML IJ SOLN
5.0000 mg | INTRAMUSCULAR | Status: DC | PRN
  Filled 2012-01-28: qty 1

## 2012-01-28 MED ORDER — LACTATED RINGERS IV SOLN: INTRAVENOUS | Status: DC

## 2012-01-28 MED ORDER — KETOROLAC TROMETHAMINE 60 MG/2ML IM SOLN
60.0000 mg | Freq: Once | INTRAMUSCULAR | Status: AC | PRN
  Administered 2012-01-28: 60 mg via INTRAMUSCULAR

## 2012-01-28 MED ORDER — METHYLERGONOVINE MALEATE 0.2 MG/ML IJ SOLN: 0.2000 mg | INTRAMUSCULAR | Status: DC | PRN

## 2012-01-28 MED ORDER — PHENYLEPHRINE 40 MCG/ML (10ML) SYRINGE FOR IV PUSH (FOR BLOOD PRESSURE SUPPORT)
PREFILLED_SYRINGE | INTRAVENOUS | Status: AC
  Filled 2012-01-28: qty 10

## 2012-01-28 MED ORDER — SODIUM BICARBONATE 8.4 % IV SOLN
INTRAVENOUS | Status: AC
  Filled 2012-01-28: qty 50

## 2012-01-28 MED ORDER — WITCH HAZEL-GLYCERIN EX PADS: 1.0000 "application " | MEDICATED_PAD | CUTANEOUS | Status: DC | PRN

## 2012-01-28 MED ORDER — MEPERIDINE HCL 25 MG/ML IJ SOLN: 6.2500 mg | INTRAMUSCULAR | Status: DC | PRN

## 2012-01-28 MED ORDER — SCOPOLAMINE 1 MG/3DAYS TD PT72
1.0000 | MEDICATED_PATCH | Freq: Once | TRANSDERMAL | Status: AC
  Administered 2012-01-28: 1.5 mg via TRANSDERMAL

## 2012-01-28 MED ORDER — LACTATED RINGERS IV SOLN
INTRAVENOUS | Status: DC
  Administered 2012-01-28: 18:00:00 via INTRAVENOUS

## 2012-01-28 MED ORDER — LANOLIN HYDROUS EX OINT: 1.0000 "application " | TOPICAL_OINTMENT | CUTANEOUS | Status: DC | PRN

## 2012-01-28 MED ORDER — ONDANSETRON HCL 4 MG PO TABS: 4.0000 mg | ORAL_TABLET | ORAL | Status: DC | PRN

## 2012-01-28 MED ORDER — PROMETHAZINE HCL 25 MG/ML IJ SOLN
12.5000 mg | INTRAMUSCULAR | Status: DC | PRN
  Administered 2012-01-28: 12.5 mg via INTRAVENOUS
  Filled 2012-01-28: qty 1

## 2012-01-28 MED ORDER — DEXTROSE 5 % IV SOLN
2.0000 g | INTRAVENOUS | Status: DC | PRN
  Administered 2012-01-28: 2 g via INTRAVENOUS

## 2012-01-28 MED ORDER — KETOROLAC TROMETHAMINE 60 MG/2ML IM SOLN
INTRAMUSCULAR | Status: AC
  Administered 2012-01-28: 60 mg via INTRAMUSCULAR
  Filled 2012-01-28: qty 2

## 2012-01-28 MED ORDER — SIMETHICONE 80 MG PO CHEW: 80.0000 mg | CHEWABLE_TABLET | ORAL | Status: DC | PRN

## 2012-01-28 MED ORDER — LACTATED RINGERS IV SOLN
INTRAVENOUS | Status: DC | PRN
  Administered 2012-01-28 (×3): via INTRAVENOUS

## 2012-01-28 MED ORDER — DIBUCAINE 1 % RE OINT: 1.0000 "application " | TOPICAL_OINTMENT | RECTAL | Status: DC | PRN

## 2012-01-28 MED ORDER — OXYTOCIN 10 UNIT/ML IJ SOLN
INTRAMUSCULAR | Status: AC
  Filled 2012-01-28: qty 4

## 2012-01-28 MED ORDER — ONDANSETRON HCL 4 MG/2ML IJ SOLN
4.0000 mg | INTRAMUSCULAR | Status: DC | PRN
  Administered 2012-01-28 (×2): 4 mg via INTRAVENOUS
  Filled 2012-01-28: qty 2

## 2012-01-28 MED ORDER — ONDANSETRON HCL 4 MG/2ML IJ SOLN
4.0000 mg | Freq: Three times a day (TID) | INTRAMUSCULAR | Status: DC | PRN
  Filled 2012-01-28: qty 2

## 2012-01-28 MED ORDER — ZOLPIDEM TARTRATE 5 MG PO TABS: 5.0000 mg | ORAL_TABLET | Freq: Every evening | ORAL | Status: DC | PRN

## 2012-01-28 MED ORDER — IBUPROFEN 600 MG PO TABS
600.0000 mg | ORAL_TABLET | Freq: Four times a day (QID) | ORAL | Status: DC
  Administered 2012-01-29 – 2012-01-31 (×10): 600 mg via ORAL
  Filled 2012-01-28 (×5): qty 1

## 2012-01-28 MED ORDER — MEPERIDINE HCL 25 MG/ML IJ SOLN
INTRAMUSCULAR | Status: DC | PRN
  Administered 2012-01-28: 12.5 mg via INTRAVENOUS

## 2012-01-28 MED ORDER — MENTHOL 3 MG MT LOZG: 1.0000 | LOZENGE | OROMUCOSAL | Status: DC | PRN

## 2012-01-28 MED ORDER — MORPHINE SULFATE (PF) 0.5 MG/ML IJ SOLN
INTRAMUSCULAR | Status: DC | PRN
  Administered 2012-01-28: 4 mg via EPIDURAL

## 2012-01-28 MED ORDER — ONDANSETRON HCL 4 MG/2ML IJ SOLN
INTRAMUSCULAR | Status: DC | PRN
  Administered 2012-01-28: 4 mg via INTRAVENOUS

## 2012-01-28 MED ORDER — MORPHINE SULFATE 0.5 MG/ML IJ SOLN
INTRAMUSCULAR | Status: AC
  Filled 2012-01-28: qty 10

## 2012-01-28 MED ORDER — NALOXONE HCL 0.4 MG/ML IJ SOLN: 0.4000 mg | INTRAMUSCULAR | Status: DC | PRN

## 2012-01-28 MED ORDER — OXYCODONE-ACETAMINOPHEN 5-325 MG PO TABS
1.0000 | ORAL_TABLET | ORAL | Status: DC | PRN
  Administered 2012-01-29: 1 via ORAL
  Administered 2012-01-30: 2 via ORAL
  Administered 2012-01-30 (×2): 1 via ORAL
  Administered 2012-01-30 – 2012-01-31 (×2): 2 via ORAL
  Administered 2012-01-31: 1 via ORAL
  Administered 2012-01-31: 2 via ORAL
  Filled 2012-01-28 (×2): qty 1
  Filled 2012-01-28: qty 2
  Filled 2012-01-28 (×2): qty 1
  Filled 2012-01-28 (×3): qty 2

## 2012-01-28 MED ORDER — BUPIVACAINE HCL (PF) 0.25 % IJ SOLN
INTRAMUSCULAR | Status: AC
  Filled 2012-01-28: qty 30

## 2012-01-28 MED ORDER — SCOPOLAMINE 1 MG/3DAYS TD PT72
MEDICATED_PATCH | TRANSDERMAL | Status: AC
  Administered 2012-01-28: 1.5 mg via TRANSDERMAL
  Filled 2012-01-28: qty 1

## 2012-01-28 MED ORDER — PRENATAL MULTIVITAMIN CH
1.0000 | ORAL_TABLET | Freq: Every day | ORAL | Status: DC
  Administered 2012-01-29 – 2012-01-30 (×2): 1 via ORAL
  Filled 2012-01-28 (×3): qty 1

## 2012-01-28 MED ORDER — TETANUS-DIPHTH-ACELL PERTUSSIS 5-2.5-18.5 LF-MCG/0.5 IM SUSP: 0.5000 mL | Freq: Once | INTRAMUSCULAR | Status: DC

## 2012-01-28 MED ORDER — MEPERIDINE HCL 25 MG/ML IJ SOLN
INTRAMUSCULAR | Status: AC
  Filled 2012-01-28: qty 1

## 2012-01-28 MED ORDER — SODIUM BICARBONATE 8.4 % IV SOLN
INTRAVENOUS | Status: DC | PRN
  Administered 2012-01-28 (×2): 5 mL via EPIDURAL

## 2012-01-28 MED ORDER — MEASLES, MUMPS & RUBELLA VAC ~~LOC~~ INJ: 0.5000 mL | INJECTION | Freq: Once | SUBCUTANEOUS | Status: DC

## 2012-01-28 MED ORDER — DIPHENHYDRAMINE HCL 50 MG/ML IJ SOLN: 25.0000 mg | INTRAMUSCULAR | Status: DC | PRN

## 2012-01-28 MED ORDER — SENNOSIDES-DOCUSATE SODIUM 8.6-50 MG PO TABS
2.0000 | ORAL_TABLET | Freq: Every day | ORAL | Status: DC
  Administered 2012-01-30: 2 via ORAL

## 2012-01-28 MED ORDER — BUPIVACAINE HCL (PF) 0.25 % IJ SOLN
INTRAMUSCULAR | Status: DC | PRN
  Administered 2012-01-28: 20 mL

## 2012-01-28 MED ORDER — NALOXONE HCL 1 MG/ML IJ SOLN
1.0000 ug/kg/h | INTRAMUSCULAR | Status: DC | PRN
  Filled 2012-01-28: qty 2

## 2012-01-28 MED ORDER — IBUPROFEN 600 MG PO TABS
600.0000 mg | ORAL_TABLET | Freq: Four times a day (QID) | ORAL | Status: DC | PRN
  Administered 2012-01-31: 600 mg via ORAL
  Filled 2012-01-28 (×6): qty 1

## 2012-01-28 MED ORDER — METOCLOPRAMIDE HCL 5 MG/ML IJ SOLN: 10.0000 mg | Freq: Three times a day (TID) | INTRAMUSCULAR | Status: DC | PRN

## 2012-01-28 MED ORDER — HYDROMORPHONE HCL PF 1 MG/ML IJ SOLN
0.2500 mg | INTRAMUSCULAR | Status: DC | PRN
  Administered 2012-01-28: 0.5 mg via INTRAVENOUS

## 2012-01-28 MED ORDER — DIPHENHYDRAMINE HCL 25 MG PO CAPS: 25.0000 mg | ORAL_CAPSULE | Freq: Four times a day (QID) | ORAL | Status: DC | PRN

## 2012-01-28 MED ORDER — SIMETHICONE 80 MG PO CHEW
80.0000 mg | CHEWABLE_TABLET | Freq: Three times a day (TID) | ORAL | Status: DC
  Administered 2012-01-29 – 2012-01-30 (×7): 80 mg via ORAL

## 2012-01-28 MED ORDER — KETOROLAC TROMETHAMINE 30 MG/ML IJ SOLN
30.0000 mg | Freq: Four times a day (QID) | INTRAMUSCULAR | Status: AC | PRN
  Administered 2012-01-28: 30 mg via INTRAVENOUS

## 2012-01-28 MED ORDER — LACTATED RINGERS IV SOLN
INTRAVENOUS | Status: DC | PRN
  Administered 2012-01-28: 09:00:00 via INTRAVENOUS

## 2012-01-28 MED ORDER — KETOROLAC TROMETHAMINE 30 MG/ML IJ SOLN
30.0000 mg | Freq: Four times a day (QID) | INTRAMUSCULAR | Status: AC | PRN
  Filled 2012-01-28: qty 1

## 2012-01-28 MED ORDER — METHYLERGONOVINE MALEATE 0.2 MG PO TABS: 0.2000 mg | ORAL_TABLET | ORAL | Status: DC | PRN

## 2012-01-28 MED ORDER — ONDANSETRON HCL 4 MG/2ML IJ SOLN
INTRAMUSCULAR | Status: AC
  Filled 2012-01-28: qty 2

## 2012-01-28 MED ORDER — DIPHENHYDRAMINE HCL 25 MG PO CAPS: 25.0000 mg | ORAL_CAPSULE | ORAL | Status: DC | PRN

## 2012-01-28 MED ORDER — MORPHINE SULFATE (PF) 0.5 MG/ML IJ SOLN
INTRAMUSCULAR | Status: DC | PRN
  Administered 2012-01-28: .5 mg via INTRAVENOUS
  Administered 2012-01-28: .5 mg via EPIDURAL

## 2012-01-28 MED ORDER — HYDROMORPHONE HCL PF 1 MG/ML IJ SOLN
INTRAMUSCULAR | Status: AC
Start: 2012-01-28 — End: 2012-01-28
  Filled 2012-01-28: qty 1

## 2012-01-28 MED ORDER — DIPHENHYDRAMINE HCL 50 MG/ML IJ SOLN
12.5000 mg | INTRAMUSCULAR | Status: DC | PRN
  Administered 2012-01-28: 12.5 mg via INTRAVENOUS
  Filled 2012-01-28: qty 1

## 2012-01-28 MED ORDER — LIDOCAINE-EPINEPHRINE (PF) 2 %-1:200000 IJ SOLN
INTRAMUSCULAR | Status: AC
  Filled 2012-01-28: qty 20

## 2012-01-28 MED ORDER — PHENYLEPHRINE HCL 10 MG/ML IJ SOLN
INTRAMUSCULAR | Status: DC | PRN
  Administered 2012-01-28: 40 ug via INTRAVENOUS
  Administered 2012-01-28: 80 ug via INTRAVENOUS
  Administered 2012-01-28: 40 ug via INTRAVENOUS
  Administered 2012-01-28: 80 ug via INTRAVENOUS

## 2012-01-28 MED ORDER — OXYTOCIN 40 UNITS IN LACTATED RINGERS INFUSION - SIMPLE MED
62.5000 mL/h | INTRAVENOUS | Status: AC
  Administered 2012-01-28: 62.5 mL/h via INTRAVENOUS

## 2012-01-28 MED ORDER — CEFAZOLIN SODIUM-DEXTROSE 2-3 GM-% IV SOLR
2.0000 g | Freq: Once | INTRAVENOUS | Status: AC
  Administered 2012-01-28: 2 g via INTRAVENOUS
  Filled 2012-01-28: qty 50

## 2012-01-28 MED ORDER — PANTOPRAZOLE SODIUM 40 MG IV SOLR
40.0000 mg | Freq: Once | INTRAVENOUS | Status: AC
  Administered 2012-01-28: 40 mg via INTRAVENOUS
  Filled 2012-01-28 (×2): qty 40

## 2012-01-28 SURGICAL SUPPLY — 36 items
BENZOIN TINCTURE PRP APPL 2/3 (GAUZE/BANDAGES/DRESSINGS) ×2 IMPLANT
BOOTIES KNEE HIGH SLOAN (MISCELLANEOUS) ×4 IMPLANT
CLOTH BEACON ORANGE TIMEOUT ST (SAFETY) ×2 IMPLANT
DRAIN JACKSON PRT FLT 10 (DRAIN) ×2 IMPLANT
DRESSING TELFA 8X3 (GAUZE/BANDAGES/DRESSINGS) ×2 IMPLANT
DRSG OPSITE POSTOP 4X10 (GAUZE/BANDAGES/DRESSINGS) ×2 IMPLANT
DURAPREP 26ML APPLICATOR (WOUND CARE) ×2 IMPLANT
ELECT REM PT RETURN 9FT ADLT (ELECTROSURGICAL) ×2
ELECTRODE REM PT RTRN 9FT ADLT (ELECTROSURGICAL) ×1 IMPLANT
EVACUATOR SILICONE 100CC (DRAIN) ×2 IMPLANT
EXTRACTOR VACUUM M CUP 4 TUBE (SUCTIONS) IMPLANT
GAUZE SPONGE 4X4 12PLY STRL LF (GAUZE/BANDAGES/DRESSINGS) ×4 IMPLANT
GLOVE BIOGEL PI IND STRL 7.0 (GLOVE) ×2 IMPLANT
GLOVE BIOGEL PI INDICATOR 7.0 (GLOVE) ×2
GLOVE ECLIPSE 6.5 STRL STRAW (GLOVE) ×2 IMPLANT
GOWN PREVENTION PLUS LG XLONG (DISPOSABLE) ×6 IMPLANT
KIT ABG SYR 3ML LUER SLIP (SYRINGE) IMPLANT
NEEDLE HYPO 22GX1.5 SAFETY (NEEDLE) ×2 IMPLANT
NEEDLE HYPO 25X5/8 SAFETYGLIDE (NEEDLE) IMPLANT
NS IRRIG 1000ML POUR BTL (IV SOLUTION) ×4 IMPLANT
PACK C SECTION WH (CUSTOM PROCEDURE TRAY) ×2 IMPLANT
PAD ABD 7.5X8 STRL (GAUZE/BANDAGES/DRESSINGS) ×2 IMPLANT
PAD OB MATERNITY 4.3X12.25 (PERSONAL CARE ITEMS) IMPLANT
RTRCTR C-SECT PINK 25CM LRG (MISCELLANEOUS) ×2 IMPLANT
SLEEVE SCD COMPRESS KNEE MED (MISCELLANEOUS) IMPLANT
STRIP CLOSURE SKIN 1/2X4 (GAUZE/BANDAGES/DRESSINGS) ×2 IMPLANT
SUT CHROMIC GUT AB #0 18 (SUTURE) IMPLANT
SUT MNCRL AB 3-0 PS2 27 (SUTURE) ×2 IMPLANT
SUT SILK 2 0 FSL 18 (SUTURE) ×2 IMPLANT
SUT VIC AB 0 CTX 36 (SUTURE) ×2
SUT VIC AB 0 CTX36XBRD ANBCTRL (SUTURE) ×2 IMPLANT
SUT VIC AB 1 CT1 36 (SUTURE) ×4 IMPLANT
SYR 20CC LL (SYRINGE) ×2 IMPLANT
TOWEL OR 17X24 6PK STRL BLUE (TOWEL DISPOSABLE) ×4 IMPLANT
TRAY FOLEY CATH 14FR (SET/KITS/TRAYS/PACK) ×2 IMPLANT
WATER STERILE IRR 1000ML POUR (IV SOLUTION) ×2 IMPLANT

## 2012-01-28 NOTE — Op Note (Signed)
Preoperative diagnosis: Intrauterine pregnancy at 39 weeks and 5 days, NRFHR  Post operative diagnosis: Same  Anesthesia: Epidural  Anesthesiologist: Dr. Cristela Blue  Procedure: Primary low transverse cesarean section  Surgeon: Dr. Dois Davenport Iceis Knab  Assistant: Nigel Bridgeman CNM  Estimated blood loss: 700 cc  Procedure:  After being informed of the planned procedure and possible complications including bleeding, infection, injury to other organs, informed consent is obtained. The patient is taken to OR #9 and given spinal anesthesia without complication. She is placed in the dorsal decubitus position with the pelvis tilted to the left. She is then prepped and draped in a sterile fashion. A Foley catheter is inserted in her bladder.  After assessing adequate level of anesthesia, we infiltrate the suprapubic area with 20 cc of Marcaine 0.25 and perform a Pfannenstiel incision which is brought down sharply to the fascia. The fascia is entered in a low transverse fashion. Linea alba is dissected. Peritoneum is entered in a midline fashion. An Alexis retractor is easily positioned.   The myometrium is then entered in a low transverse fashion, 2 cm above the vesico-uterine junction ; first with knife and then extended bluntly. Amniotic fluid is meconial and thick. We assist the birth of a female  infant in vertex OP presentation. Mouth and nose are suctioned. The baby is delivered. The cord is clamped and sectioned. The baby is given to the neonatologist present in the room.  Cord gas is drawn from the umbilical artery.10 cc of blood is drawn from the umbilical vein.The placenta is allowed to deliver spontaneously. It is complete and the cord has 3 vessels. Uterine revision is negative.  We proceed with closure of the myometrium in 2 layers: First with a running locked suture of 0 Vicryl, then with a Lembert suture of 0 Vicryl imbricating the first one. Hemostasis is completed with cauterization on  peritoneal edges and a figure-of-eight stitch in the left angle.  Both paracolic gutters are cleaned. Both tubes and ovaries are assessed and normal. The pelvis is profusely irrigated with warm saline to confirm a satisfactory hemostasis.  Retractors and sponges are removed. Under fascia hemostasis is completed with cauterization. The fascia is then closed with 2 running sutures of 0 Vicryl meeting midline. The wound is irrigated with warm saline and hemostasis is completed with cauterization. A #10 JP drain is positioned in the wound and sutured to the skin with 0-Silk.The skin is closed with a subcuticular suture of 3-0 Monocryl and Steri-Strips.  Instrument and sponge count is complete x2. Estimated blood loss is 700 cc.  The procedure is well tolerated by the patient who is taken to recovery room in a well and stable condition.  female baby named Hermina Barters was born at 8:47 am and received an Apgar of 6  at 1 minute, 7 at 5 minutes and 8 at 10 minutes. Cord pH=7.07   Specimen: Placenta sent to pathology   [facility] A MD 12/9/20139:42 AM

## 2012-01-28 NOTE — Anesthesia Postprocedure Evaluation (Signed)
Anesthesia Post Note  Patient: [name] [name]  Procedure(s) Performed: Procedure(s) (LRB): CESAREAN SECTION (N/A)  Anesthesia type: Epidural  Patient location: PACU  Post pain: Pain level controlled  Post assessment: Post-op Vital signs reviewed  Last Vitals:  Filed Vitals:   01/28/12 0938  BP: 121/61  Pulse: 83  Temp: 37.5 C  Resp: 18    Post vital signs: Reviewed  Level of consciousness: awake  Complications: No apparent anesthesia complications

## 2012-01-28 NOTE — Anesthesia Postprocedure Evaluation (Signed)
  Anesthesia Post-op Note  Patient: [name] [name]  Procedure(s) Performed: Procedure(s) (LRB) with comments: CESAREAN SECTION (N/A)  Patient Location: mother baby unit  Anesthesia Type:Epidural  Level of Consciousness: awake, alert , oriented and patient cooperative  Airway and Oxygen Therapy: Patient Spontanous Breathing  Post-op Pain: none  Post-op Assessment: Post-op Vital signs reviewed and Patient's Cardiovascular Status Stable  Post-op Vital Signs: Reviewed and stable  Complications: No apparent anesthesia complications

## 2012-01-28 NOTE — Progress Notes (Signed)
  Subjective: Comfortable with epidural.  Currently on right side.  Objective: BP 122/74  Pulse 71  Temp 98.5 F (36.9 C) (Oral)  Resp 20  Ht 4\' 11"  (1.499 m)  Wt 193 lb (87.544 kg)  BMI 38.98 kg/m2  SpO2 100%  LMP 04/19/2011      FHT:  Baseline 180, variables with UCs to 90 x 10-20 sec--had several previously down to 60-70 x 40-50 sec, resolved with position change.  Moderate variability  UC:   q 4 minutes, moderate to palpation. SVE:   Dilation: 7 Effacement (%): 90 Station: -1;-2 Exam by:: Emilee Hero, CNM  IUPC and FSE applied without difficulty. Outlet narrow, with small amount molding noted.  More cervix on right than left.   Assessment / Plan: Active labor, with cervical change Variable decels Fetal tachycardia  Plan: Amnioinfusion now O2 on Position change to facilitate rotation and descent. Dr. Estanislado Pandy updated by H. Steelman, CNM. Will re-evaluate after amnioinfusion. Close observation of FHR status.   [name] [name] 01/28/2012, 7:30 AM

## 2012-01-28 NOTE — Transfer of Care (Signed)
Immediate Anesthesia Transfer of Care Note  Patient: [name] [name]  Procedure(s) Performed: Procedure(s) (LRB) with comments: CESAREAN SECTION (N/A)  Patient Location: PACU  Anesthesia Type:Epidural  Level of Consciousness: awake, alert  and oriented  Airway & Oxygen Therapy: Patient Spontanous Breathing  Post-op Assessment: Report given to PACU RN  Post vital signs: Reviewed and stable  Complications: No apparent anesthesia complications

## 2012-01-28 NOTE — Progress Notes (Addendum)
  Subjective: Repetitive deep variable decels, cervix unchanged.  Baseline FHR 180-180, UCs q 3-4 min, MVUs 150-170. Comfortable with epidural.  Objective: BP 110/60  Pulse 60  Temp 99 F (37.2 C) (Oral)  Resp 18  Ht 4\' 11"  (1.499 m)  Wt 193 lb (87.544 kg)  BMI 38.98 kg/m2  SpO2 100%  LMP 04/19/2011      SVE:   Dilation: 7 Effacement (%): 90 Station: -1 Exam by:: Emilee Hero, CNM   Assessment / Plan: Non-reassuring FHR, no cervical change Dr. Estanislado Pandy notified. Will proceed with C/S. R&B of C/S reviewed with patient and family, including bleeding, infection, and damage to other organs. Patient and family seem to understand these issues and wish to proceed. LATHAM, VICKI 01/28/2012, 8:00 AM   Pt seen and agree with plan of care Questions answered

## 2012-01-28 NOTE — Consult Note (Signed)
Neonatology Note:   Attendance at C-section:    I was asked to attend this primary C/S at term due to repetitive FHR decelerations, NRFHR. The mother is a G1P0 O pos, GBS neg with mild PIH, no medications, and migraine headaches. ROM 10 hours prior to delivery, fluid clear. At uterine incision, the fluid was noted to have thin meconium. Infant had decreased tone at birth, was dusky, and was breathing, but not crying. Needed bulb suctioning, at which time we got thin green fluid from the nares and throat (small amounts). We gave stimulation to get her to cry, but she continued to only cry weakly and was having intermittent grunting. Her skin was pale with slightly decreased perfusion. We placed a pulse oximeter at 6-7 minutes of life and the O2 saturation was 40% in room air. We gave BBO2 and she came up to the mid 80s, but was retracting and grunting some and continued to have slightly decreased tone and resp effort. We placed her on the neopuff at about 12-13 minutes with improvement in air exchange and color. She was shown to her mother in the OR suite and her father came with [place] to the NICU. She was transported on the neopuff at +5 PEEP and weaned to 60% FIO2 by the time we arrived to the NICU. Ap 6/7/8.    Deatra James, MD

## 2012-01-28 NOTE — Progress Notes (Signed)
Patient ID: [name] [name], female   DOB: 03-03-1980, 31 y.o.   MRN: [id number] FHR baseline now back to 160s.  Remained tachycardic for about 15 minutes.  Moderate variability noted throughout and no further decels.  Will CTO closely. Rexene Edison, CNM 01/28/12 [phone]

## 2012-01-28 NOTE — Progress Notes (Signed)
Subjective: Pt has been resting off and on since received epidural.  No PIH s/s.  Husband and pt's parents remain at bedside.  Objective: BP 121/74  Pulse 70  Temp 97.7 F (36.5 C) (Oral)  Resp 18  Ht 4\' 11"  (1.499 m)  Wt 193 lb (87.544 kg)  BMI 38.98 kg/m2  SpO2 100%  LMP 04/19/2011      FHT:  FHR: 150 bpm, variability: moderate,  accelerations:  Present,  decelerations:  Present prolonged decel for 8 min to min in 60s w/ recovery to 210 now UC:   regular, every 2-4 minutes SVE: 5/80/-2 to -1 cx puffy anteriorly and tight; molding noted on head; asynclitic IUPC easily inserted around 2-3 o'clock. Catheter filled w/ blood, and subsequently removed Labs: Lab Results  Component Value Date   WBC 15.9* 01/27/2012   HGB 10.7* 01/27/2012   HCT 33.1* 01/27/2012   MCV 84.0 01/27/2012   PLT 232 01/27/2012    Assessment / Plan: Spontaneous labor, progressing normally; prolonged decel after SVE   Labor: Progressing normally Preeclampsia:  labs stable Fetal Wellbeing:  Category II Pain Control:  Epidural I/D:  n/a Anticipated MOD:  NSVD 1.  FHR remains tachy at present and likely overcompensating for prolonged decel; will CTO closely; pt has IVFB going and oxygen on.  She was repositioned from side to side 3 times.  2. C/w MD prn  Shaneece Stockburger H 01/28/2012, 4:21 AM

## 2012-01-28 NOTE — Anesthesia Procedure Notes (Signed)
Epidural Patient location during procedure: OB Start time: 01/27/2012 11:55 PM  Staffing Anesthesiologist: Rain Friedt A. Performed by: anesthesiologist   Preanesthetic Checklist Completed: patient identified, site marked, surgical consent, pre-op evaluation, timeout performed, IV checked, risks and benefits discussed and monitors and equipment checked  Epidural Patient position: sitting Prep: site prepped and draped and DuraPrep Patient monitoring: continuous pulse ox and blood pressure Approach: midline Injection technique: LOR air  Needle:  Needle type: Tuohy  Needle gauge: 17 G Needle length: 9 cm and 9 Needle insertion depth: 6 cm Catheter type: closed end flexible Catheter size: 19 Gauge Catheter at skin depth: 11 cm Test dose: negative and Other  Assessment Events: blood not aspirated, injection not painful, no injection resistance, negative IV test and no paresthesia  Additional Notes Patient identified. Risks and benefits discussed including failed block, incomplete  Pain control, post dural puncture headache, nerve damage, paralysis, blood pressure Changes, nausea, vomiting, reactions to medications-both toxic and allergic and post Partum back pain. All questions were answered. Patient expressed understanding and wished to proceed. Sterile technique was used throughout procedure. Epidural site was Dressed with sterile barrier dressing. No paresthesias, signs of intravascular injection Or signs of intrathecal spread were encountered.  Patient was more comfortable after the epidural was dosed. Please see RN's note for documentation of vital signs and FHR which are stable.

## 2012-01-29 ENCOUNTER — Encounter (HOSPITAL_COMMUNITY): Payer: Self-pay | Admitting: Obstetrics and Gynecology

## 2012-01-29 LAB — CBC
HCT: 24.9 % — ABNORMAL LOW (ref 36.0–46.0)
MCH: 27.1 pg (ref 26.0–34.0)
MCH: 27.4 pg (ref 26.0–34.0)
MCHC: 31.7 g/dL (ref 30.0–36.0)
MCHC: 31.7 g/dL (ref 30.0–36.0)
MCV: 85.3 fL (ref 78.0–100.0)
Platelets: 147 10*3/uL — ABNORMAL LOW (ref 150–400)
Platelets: 170 10*3/uL (ref 150–400)
RDW: 14.9 % (ref 11.5–15.5)
RDW: 15 % (ref 11.5–15.5)

## 2012-01-29 MED ORDER — POLYSACCHARIDE IRON COMPLEX 150 MG PO CAPS
150.0000 mg | ORAL_CAPSULE | Freq: Two times a day (BID) | ORAL | Status: DC
  Filled 2012-01-29 (×6): qty 1

## 2012-01-29 NOTE — Clinical Social Work Psychosocial (Signed)
Clinical Social Work Department PSYCHOSOCIAL ASSESSMENT - MATERNAL/CHILD 01/29/2012  Patient:  [name] [name]  Account Number:  [id number]  Admit Date:  01/27/2012  Childs Name:   [name] [name]    Clinical Social Worker:  Celinda Dethlefs GRIER Jersey Espinoza, LCSWA   Date/Time:  01/29/2012 10:15 AM  Date Referred:  01/29/2012   Referral source  Physician     Referred reason  Depression/Anxiety  NICU   Other referral source:   csw screening    I:  FAMILY / HOME ENVIRONMENT Child's legal guardian:  PARENT  Guardian - Name Guardian - Age Guardian - Address  [name] [name] 31 [address] [place], [place]  [name] [name]  same   Other household support members/support persons Other support:   extended family    II  PSYCHOSOCIAL DATA Information Source:  Family Interview  Financial and Community Resources Employment:   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   Financial resources:  Private Insurance If Medicaid - County:    School / Grade:   Maternity Care Coordinator / Child Services Coordination / Early Interventions:  Cultural issues impacting care:   none    III  STRENGTHS Strengths  Adequate Resources  Compliance with medical plan  Home prepared for Child (including basic supplies)  Supportive family/friends  Understanding of illness   Strength comment:  no concerns noted   IV  RISK FACTORS AND CURRENT PROBLEMS Current Problem:  YES   Risk Factor & Current Problem Patient Issue Family Issue Risk Factor / Current Problem Comment  Other - See comment N Y h/o depression.    V  SOCIAL WORK ASSESSMENT CSW met with [name] due to h/o depression and NICU admit. [name] was on lexapro for depression until 6 weeks into pregnancy. [name] reports she has been coping well and has had no concerns with depression currently. She reports she is aware of pp depression symptoms and cSW also discussed typical coping with NICU baby, baby blues vs. PPD. [name] described good supports to assist and  agrees to seek assistance as noted. [name] was smiling and appears to be coping well.      VI SOCIAL WORK PLAN Social Work Plan  Psychosocial Support/Ongoing Assessment of Needs   Type of pt/family education:   CSW discussed resources to assist and issues that may occur.   If child protective services report - county:   If child protective services report - date:   Information/referral to community resources comment:   Other social work plan:   Family well prepared. CSW available for support.     Grier Clarisse Rodriges, LCSW  Coverning NICU for [facility] M-F 8am-12pm  

## 2012-01-29 NOTE — Clinical Social Work Note (Signed)
CSW received consult for h/o depression and NICU baby. CSW will see later this am to complete full assessment.   Grier Ofilia Rayon, LCSW  Coverning NICU for [facility] M-F 8am-12pm  

## 2012-01-29 NOTE — Progress Notes (Signed)
Contacted [employer] CNM regarding 24 hr urine order.  Instructed to not complete 24 hour urine since it was stopped yesterday upon transfer to Mother Baby unit.

## 2012-01-29 NOTE — Progress Notes (Signed)
Patient ID: [name] [name], female   DOB: 1980-04-02, 31 y.o.   MRN: [id number] Subjective: Postpartum Day 1: Cesarean Delivery secondary to: NRFHT Patient reports incisional pain, tolerating PO, + flatus and no problems voiding.  no complaints, up ad lib without syncope Pain well controlled with po meds, using motrin and percocet  Pt pumping  Mood stable, bonding well, infant stable in NICU,  Contraception:   Objective: Vital signs in last 24 hours: Temp:  [97.4 F (36.3 C)-98.6 F (37 C)] 97.8 F (36.6 C) (12/10 0915) Pulse Rate:  [51-117] 87  (12/10 0915) Resp:  [18-20] 20  (12/10 0915) BP: (93-133)/(56-80) 107/65 mmHg (12/10 0915) SpO2:  [93 %-98 %] 97 % (12/10 0915)  Physical Exam:  General: alert and no distress Heart: RRR Lungs: CTAB Abdomen: BS x4 Uterine Fundus: firm Incision: dressing CDI  JP drain: 10-20cc serosanguinous drainage   Lochia: appropriate DVT Evaluation: No evidence of DVT seen on physical exam. Negative Homan's sign. No significant calf/ankle edema.   Basename 01/29/12 0517 01/28/12 1031  HGB 7.9* 9.3*  HCT 24.9* 28.8*    Assessment/Plan: Status post Cesarean section. Doing well postoperatively.  PP anemia, no sx's, will check orthostatics FE supplement  Support for infant in NICU  Continue current care.   Baleria Wyman M 01/29/2012, 10:15 AM

## 2012-01-29 NOTE — Anesthesia Preprocedure Evaluation (Addendum)
Anesthesia Evaluation  Patient identified by MRN, date of birth, ID band Patient awake    Reviewed: Allergy & Precautions, H&P , Patient's Chart, lab work & pertinent test results  Airway Mallampati: III TM Distance: >3 FB Neck ROM: full    Dental No notable dental hx. (+) Teeth Intact   Pulmonary neg pulmonary ROS,  breath sounds clear to auscultation  Pulmonary exam normal       Cardiovascular hypertension, Rhythm:regular Rate:Normal     Neuro/Psych  Headaches, PSYCHIATRIC DISORDERS Depression negative neurological ROS  negative psych ROS   GI/Hepatic Neg liver ROS, GERD-  Medicated and Controlled,  Endo/Other  Morbid obesity  Renal/GU negative Renal ROS  negative genitourinary   Musculoskeletal   Abdominal Normal abdominal exam  (+)   Peds  Hematology negative hematology ROS (+)   Anesthesia Other Findings   Reproductive/Obstetrics (+) Pregnancy                          Anesthesia Physical Anesthesia Plan  ASA: II  Anesthesia Plan:    Post-op Pain Management:    Induction:   Airway Management Planned:   Additional Equipment:   Intra-op Plan:   Post-operative Plan:   Informed Consent:   Plan Discussed with:   Anesthesia Plan Comments:         Anesthesia Quick Evaluation

## 2012-01-29 NOTE — Anesthesia Postprocedure Evaluation (Signed)
  Anesthesia Post-op Note  Patient: [name] [name]  Procedure(s) Performed: Procedure(s) (LRB) with comments: CESAREAN SECTION (N/A)  Patient Location: Mother/Baby  Anesthesia Type:Epidural  Level of Consciousness: awake  Airway and Oxygen Therapy: Patient Spontanous Breathing  Post-op Pain: none  Post-op Assessment: Patient's Cardiovascular Status Stable, Respiratory Function Stable, Patent Airway, No signs of Nausea or vomiting, Adequate PO intake, Pain level controlled, No headache, No backache, No residual numbness and No residual motor weakness  Post-op Vital Signs: Reviewed and stable  Complications: No apparent anesthesia complications

## 2012-01-30 ENCOUNTER — Telehealth: Payer: Self-pay | Admitting: Obstetrics and Gynecology

## 2012-01-30 ENCOUNTER — Encounter: Payer: Commercial Managed Care - PPO | Admitting: Obstetrics and Gynecology

## 2012-01-30 DIAGNOSIS — Z98891 History of uterine scar from previous surgery: Secondary | ICD-10-CM | POA: Diagnosis not present

## 2012-01-30 NOTE — Progress Notes (Signed)
Subjective: Postpartum Day 2 Cesarean Delivery Patient reports tolerating PO, + flatus and no problems voiding.   S: comfortable, little bleeding, slept     breastfeeding Hemoglobin & Hematocrit     Component Value Date/Time   HGB 8.4* 01/29/2012 2245   HGB 13.6 07/26/2011 1222   HCT 26.5* 01/29/2012 2245   HCT 42.1 07/26/2011 1222    Temp:  [97.7 F (36.5 C)-98.1 F (36.7 C)] 97.7 F (36.5 C) (12/11 0530) Pulse Rate:  [76-86] 76  (12/11 0530) Resp:  [20] 20  (12/11 0530) BP: (117-121)/(80-83) 117/80 mmHg (12/11 0530)  Physical Exam:  General: alert, cooperative and no distress Lochia: appropriate Uterine Fundus: firm Incision: with dressing scant red drainage  DVT Evaluation: Negative Homan's sign. No significant calf/ankle edema. Lungs clear bilaterally AP RRR Bowel sounds active abd    nontympanic A normal involution    anemia     Lactating     PO day 2     Normal involution P plans mirena IUP risks and benefits discussed, continue care Lavera Guise, CNM

## 2012-01-30 NOTE — Telephone Encounter (Signed)
Tc to pt regarding msg.  Lm on vm to call back. 

## 2012-01-31 DIAGNOSIS — D649 Anemia, unspecified: Secondary | ICD-10-CM

## 2012-01-31 MED ORDER — FERROUS SULFATE 325 (65 FE) MG PO TABS: 325.0000 mg | ORAL_TABLET | Freq: Two times a day (BID) | ORAL | Status: DC

## 2012-01-31 MED ORDER — OXYCODONE-ACETAMINOPHEN 5-325 MG PO TABS: 1.0000 | ORAL_TABLET | ORAL | Status: DC | PRN

## 2012-01-31 MED ORDER — IBUPROFEN 600 MG PO TABS: 600.0000 mg | ORAL_TABLET | Freq: Four times a day (QID) | ORAL | Status: DC | PRN

## 2012-01-31 NOTE — Discharge Summary (Signed)
Physician Discharge Summary  Patient ID: [name] [name] MRN: [id number] DOB/AGE: 09-24-1980 31 y.o.  Admit date: 01/27/2012 Discharge date: 01/31/2012  Admission Diagnoses: [redacted]w[redacted]d early labor  Discharge Diagnoses:  Active Problems:  Status post primary low transverse cesarean section anemia Patient Active Problem List  Diagnosis  . Migraines  . Edema in pregnancy  . Mild hypertension  . Status post primary low transverse cesarean section   Discharged Condition: stable  Hospital Course: [redacted]w[redacted]d early labor, IV Pitocin, NRFHR primary C/S, normal involution, anemia, Mirena IUD  Consults: None   Treatments: IV hydration  Discharge Exam: Blood pressure 126/85, pulse 73, temperature 98.5 F (36.9 C), temperature source Oral, resp. rate 18, height 4\' 11"  (1.499 m), weight 193 lb (87.544 kg), last menstrual period 04/19/2011, SpO2 97.00%, unknown if currently breastfeeding. General appearance: alert, cooperative and no distress Subjective: Postpartum Day 3 Cesarean Delivery Patient reports tolerating PO, + flatus, + BM and no problems voiding.   S: comfortable, little bleeding, slept pain meds working well     Feeding Hemoglobin & Hematocrit     Component Value Date/Time   HGB 8.4* 01/29/2012 2245   HGB 13.6 07/26/2011 1222   HCT 26.5* 01/29/2012 2245   HCT 42.1 07/26/2011 1222    Temp:  [97.5 F (36.4 C)-98.5 F (36.9 C)] 98.5 F (36.9 C) (12/12 0625) Pulse Rate:  [73-102] 73  (12/12 0625) Resp:  [18-20] 18  (12/12 0625) BP: (124-133)/(80-89) 126/85 mmHg (12/12 7829)  Physical Exam:  General: alert, cooperative and no distress Lochia: appropriate Uterine Fundus: firm Incision: well approximated no redness, edema, with sm serous drainage at JP site with gauze steri strips on DVT Evaluation: Negative Homan's sign. Calf/Ankle edema is present +2 pitting to calves Lungs clear bilaterally AP RRR Bowel sounds active abd nontympanic    Disposition: 01-Home or Self  Care  Discharge Orders    Future Orders Please Complete By Expires   Discharge patient          Medication List     As of 01/31/2012 12:13 PM    STOP taking these medications         acetaminophen 500 MG tablet   Commonly known as: TYLENOL      FLEXERIL 10 MG tablet   Generic drug: cyclobenzaprine      HYDROcodone-acetaminophen 5-500 MG per tablet   Commonly known as: VICODIN      ondansetron 4 MG tablet   Commonly known as: ZOFRAN      promethazine 25 MG tablet   Commonly known as: PHENERGAN      TAKE these medications         esomeprazole 40 MG capsule   Commonly known as: NEXIUM   Take 1 capsule (40 mg total) by mouth daily.      ferrous sulfate 325 (65 FE) MG tablet   Take 1 tablet (325 mg total) by mouth 2 (two) times daily with a meal.      ibuprofen 600 MG tablet   Commonly known as: ADVIL,MOTRIN   Take 1 tablet (600 mg total) by mouth every 6 (six) hours as needed.      oxyCODONE-acetaminophen 5-325 MG per tablet   Commonly known as: PERCOCET/ROXICET   Take 1 tablet by mouth every 4 (four) hours as needed (moderate - severe pain).      prenatal multivitamin Tabs   Take 1 tablet by mouth daily.      ranitidine 150 MG tablet   Commonly known as: ZANTAC  Take 150 mg by mouth daily as needed. heartburn           Follow-up Information    Follow up with [facility]. In 5 weeks.   Contact information:   [address] [place] [address] [phone]       discussed care of JP site s/s infection to incision to report, use of iron and plans miralax to avoid constipation.  SignedLavera Guise 01/31/2012, 12:13 PM

## 2012-02-04 ENCOUNTER — Telehealth (HOSPITAL_COMMUNITY): Payer: Self-pay | Admitting: *Deleted

## 2012-02-04 NOTE — Telephone Encounter (Signed)
Resolve episode 

## 2012-02-06 ENCOUNTER — Emergency Department (INDEPENDENT_AMBULATORY_CARE_PROVIDER_SITE_OTHER)
Admission: EM | Admit: 2012-02-06 | Discharge: 2012-02-06 | Disposition: A | Discharge status: Home / Self Care | Payer: 59 | Source: Home / Self Care | Attending: Family Medicine | Admitting: Family Medicine

## 2012-02-06 DIAGNOSIS — R5383 Other fatigue: Secondary | ICD-10-CM

## 2012-02-06 DIAGNOSIS — R5381 Other malaise: Secondary | ICD-10-CM

## 2012-02-06 NOTE — ED Notes (Signed)
[name] [name] complains of fatigue since her C-Section.

## 2012-02-06 NOTE — ED Provider Notes (Addendum)
History     CSN: [id number]  Arrival date & time 02/06/12  1542   First MD Initiated Contact with Patient 02/06/12 1615      Chief Complaint  Patient presents with  . Fatigue   HPI  Pt presents today with chief complaint of mild fatigue.  Pt noted to have had LTCS for term delivery approx 1 week ago.  Pt had some edema and mild HTN, however no diagnosis of preeclampsia or chorioamnitis.  No vision changes, RUQ pain.  Has had mild HA consistent with migraines prior to pregnancy.  Pt states that her hgb was around 7.9 at time of discharge. Has been on BID iron since this point.  Pt wanted to make sure that fatigue may not be from worsening hgb.  Vaginal bleeding has been stable.  No episodes of syncope/pre-syncope. Sleep has been fairly adequate with new baby. Getting 6-8 hours per day. Pt states that she sleeps when baby sleeps.  Mood has been stable.   Past Medical History  Diagnosis Date  . Migraines     MIGRAINES;HAS TAKEN MAXALT AND VICODIN IN THE PAST OR CAFFEINE  . Depression     HAS TAKEN LEXAPRO 40MG  DAILY BEFORE PREGNANCY;WEANED SELF OFF  . Infection     YEAST INF;TYPICALLY AFTER ANTIBXS;NOT FREQ  . Abnormal Pap smear 2005    PRE-CANCEROUS CELLS ON CX;COLPO DONE AND CELLS "BURNED OFF";LAST PAP 2010 OR 2011,WAS NORMAL    Past Surgical History  Procedure Date  . Wisdom tooth extraction 1998    ALL 4 EXTRACTED  . Colposcopy   . Cryotherapy   . Cesarean section 01/28/2012    Procedure: CESAREAN SECTION;  Surgeon: Esmeralda Arthur, MD;  Location: [facility];  Service: Obstetrics;  Laterality: N/A;    Family History  Problem Relation Age of Onset  . Migraines Father     IN THE PAST  . Migraines Paternal Grandfather   . Thyroid disease Mother   . Alzheimer's disease Father     DX'D @ 23    History  Substance Use Topics  . Smoking status: Never Smoker   . Smokeless tobacco: Never Used  . Alcohol Use: No     Comment: RARELY    OB History    Grav Para Term  Preterm Abortions TAB SAB Ect Mult Living   1 1 1  0 0 0 0 0 0 1      Review of Systems  All other systems reviewed and are negative.    Allergies  Review of patient's allergies indicates no known allergies.  Home Medications   Current Outpatient Rx  Name  Route  Sig  Dispense  Refill  . ESOMEPRAZOLE MAGNESIUM 40 MG PO CPDR   Oral   Take 1 capsule (40 mg total) by mouth daily.   30 capsule   3   . FERROUS SULFATE 325 (65 FE) MG PO TABS   Oral   Take 1 tablet (325 mg total) by mouth 2 (two) times daily with a meal.   30 tablet   1     Do not take with calcium do not take with prenatal ...   . IBUPROFEN 600 MG PO TABS   Oral   Take 1 tablet (600 mg total) by mouth every 6 (six) hours as needed.   30 tablet   1     Take with a large glass of water and food   . OXYCODONE-ACETAMINOPHEN 5-325 MG PO TABS   Oral  Take 1 tablet by mouth every 4 (four) hours as needed (moderate - severe pain).   30 tablet   0   . PRENATAL MULTIVITAMIN CH   Oral   Take 1 tablet by mouth daily.         Marland Kitchen RANITIDINE HCL 150 MG PO TABS   Oral   Take 150 mg by mouth daily as needed. heartburn           BP 125/88  Pulse 82  Temp 97.9 F (36.6 C)  Resp 18  Ht 4\' 11"  (1.499 m)  Wt 178 lb (80.74 kg)  BMI 35.95 kg/m2  SpO2 98%  Breastfeeding? Unknown  Physical Exam  Constitutional: She appears well-developed and well-nourished.  HENT:  Head: Normocephalic and atraumatic.  Right Ear: External ear normal.  Left Ear: External ear normal.  Eyes: Pupils are equal, round, and reactive to light.  Neck: Normal range of motion. Neck supple.  Cardiovascular: Normal rate, regular rhythm and normal heart sounds.   Pulmonary/Chest: Effort normal and breath sounds normal.  Abdominal: Soft. Bowel sounds are normal.       c section incisional site CDI  Musculoskeletal: Normal range of motion.  Neurological: She is alert.  Skin: Skin is warm.    ED Course  Procedures (including  critical care time)   Labs Reviewed  HEMOGLOBIN POC [facility])   No results found.   1. Fatigue       MDM  Hgb toady at 10.5, which reassuring. Continue iron and PNV.  Physical exam and VS WNL.  Suspect sxs may be from new motherhood.  Discussed preeclamptic red flags including vision changes, RUQ pain and HTN.  Otherwise follow up with Ob-Gyn as needed.      The patient and/or caregiver has been counseled thoroughly with regard to treatment plan and/or medications prescribed including dosage, schedule, interactions, rationale for use, and possible side effects and they verbalize understanding. Diagnoses and expected course of recovery discussed and will return if not improved as expected or if the condition worsens. Patient and/or caregiver verbalized understanding.              Doree Albee, MD 02/06/12 0981  Doree Albee, MD 02/06/12 [phone]

## 2012-02-08 ENCOUNTER — Telehealth: Payer: Self-pay | Admitting: *Deleted

## 2012-02-12 NOTE — Telephone Encounter (Signed)
No return call, chart to file back.

## 2012-02-22 ENCOUNTER — Telehealth: Payer: Self-pay | Admitting: Obstetrics and Gynecology

## 2012-02-22 NOTE — Telephone Encounter (Signed)
Pt wanting to know if she is able to participate in a hot yoga 60 minute class 3-4 times weekly. States that the room is approximately 100 degrees.  After consulting with VL, she prefers for pt to be seen for PP visit before attending to class, due to changes in heat tolerance and hormone changes. Pt advised and understanding  Darien Ramus, CMA

## 2012-02-22 NOTE — Telephone Encounter (Signed)
sr pt 

## 2012-03-03 ENCOUNTER — Encounter: Payer: Self-pay | Admitting: Obstetrics and Gynecology

## 2012-03-03 ENCOUNTER — Ambulatory Visit (INDEPENDENT_AMBULATORY_CARE_PROVIDER_SITE_OTHER): Payer: Commercial Managed Care - PPO | Admitting: Obstetrics and Gynecology

## 2012-03-03 NOTE — Progress Notes (Signed)
Date of delivery: 01/28/12 Female Name: [name] [name] Vaginal delivery:no Cesarean section:yes Tubal ligation:no GDM:no Breast Feeding:no Bottle Feeding:yes Post-Partum Blues:no Abnormal pap:yes 8 yrs ago Normal GU function: yes Normal GI function:yes Returning to work:yes 04/09/12 EPDS: 7 BP 100/80  Temp 97.8 F (36.6 C) (Oral)  Wt 174 lb (78.926 kg)  Breastfeeding? No. female who presents for a postpartum visit.  ABD: soft nontender GU: vulva normal no masses seen.  Vagina normal in appearance.  Cervix is parous and NT.  Uterus normal size.  No adnexal tenderness bilaterally or fullness EXT: no CCEB IUD pt wil return for mirena May resume intercourse , exercise and normal activity RT 6/14 for aex Pt states her mood is ok.  She has no HI or SI

## 2012-03-03 NOTE — Patient Instructions (Signed)

## 2012-03-18 ENCOUNTER — Ambulatory Visit: Payer: Commercial Managed Care - PPO | Admitting: Obstetrics and Gynecology

## 2012-03-18 ENCOUNTER — Encounter: Payer: Self-pay | Admitting: Obstetrics and Gynecology

## 2012-03-18 VITALS — BP 100/70 | Ht 59.0 in | Wt 171.0 lb

## 2012-03-18 DIAGNOSIS — Z30430 Encounter for insertion of intrauterine contraceptive device: Secondary | ICD-10-CM

## 2012-03-18 DIAGNOSIS — Z309 Encounter for contraceptive management, unspecified: Secondary | ICD-10-CM

## 2012-03-18 LAB — POCT URINE PREGNANCY: Preg Test, Ur: NEGATIVE

## 2012-03-18 MED ORDER — LEVONORGESTREL 20 MCG/24HR IU IUD
INTRAUTERINE_SYSTEM | Freq: Once | INTRAUTERINE | Status: AC
  Administered 2012-03-18: 1 via INTRAUTERINE

## 2012-03-18 NOTE — Progress Notes (Signed)
IUD INSERTION NOTE   Consent signed after risks and benefits were reviewed including but not limited to bleeding, infection and risk of uterine perforation.  LMP: No LMP recorded. UPT: negative GC / Chlamydia: negative 08/13/2011  MIRENA SERIAL NUMBER: [id number]  Prepping with Betadine Tenaculum placed on anterior lip of cervix Uterus sounded at  7 cm Insertion of MIRENA IUD per protocol without any complications  POST-PROCEDURE:  Patient instructed to call with fever or excessive pain Patient instructed to check IUD strings after each menstrual cycle   Follow-up: 2 weeks   Silverio Lay MD 03/18/2012 9:46 AM

## 2012-03-31 ENCOUNTER — Encounter (HOSPITAL_BASED_OUTPATIENT_CLINIC_OR_DEPARTMENT_OTHER): Payer: Self-pay | Admitting: *Deleted

## 2012-03-31 ENCOUNTER — Inpatient Hospital Stay (HOSPITAL_BASED_OUTPATIENT_CLINIC_OR_DEPARTMENT_OTHER)
Admission: EM | Admit: 2012-03-31 | Discharge: 2012-04-01 | DRG: 340 | Disposition: A | Discharge status: Home / Self Care | Payer: 59 | Attending: Emergency Medicine | Admitting: Emergency Medicine

## 2012-03-31 DIAGNOSIS — K3533 Acute appendicitis with perforation and localized peritonitis, with abscess: Principal | ICD-10-CM | POA: Diagnosis present

## 2012-03-31 DIAGNOSIS — K37 Unspecified appendicitis: Secondary | ICD-10-CM

## 2012-03-31 DIAGNOSIS — K660 Peritoneal adhesions (postprocedural) (postinfection): Secondary | ICD-10-CM | POA: Diagnosis present

## 2012-03-31 DIAGNOSIS — Z79899 Other long term (current) drug therapy: Secondary | ICD-10-CM

## 2012-03-31 DIAGNOSIS — F329 Major depressive disorder, single episode, unspecified: Secondary | ICD-10-CM | POA: Diagnosis present

## 2012-03-31 DIAGNOSIS — Z6833 Body mass index (BMI) 33.0-33.9, adult: Secondary | ICD-10-CM

## 2012-03-31 DIAGNOSIS — T8332XA Displacement of intrauterine contraceptive device, initial encounter: Secondary | ICD-10-CM

## 2012-03-31 DIAGNOSIS — K3580 Unspecified acute appendicitis: Secondary | ICD-10-CM | POA: Diagnosis present

## 2012-03-31 DIAGNOSIS — F3289 Other specified depressive episodes: Secondary | ICD-10-CM | POA: Diagnosis present

## 2012-03-31 DIAGNOSIS — E669 Obesity, unspecified: Secondary | ICD-10-CM | POA: Diagnosis present

## 2012-03-31 NOTE — ED Notes (Signed)
MD at bedside. 

## 2012-03-31 NOTE — ED Notes (Signed)
Pt c/o lower abd pain with n/v/d x 10 hrs

## 2012-03-31 NOTE — ED Provider Notes (Signed)
History    This chart was scribed for [name] Delaguila Smitty Cords, MD by [name] [name], ED Scribe. This patient was seen in room MH05/MH05 and the patient's care was started at 2359.   CSN: [id number]  Arrival date & time 03/31/12  2343   None     Chief Complaint  Patient presents with  . Emesis  . Diarrhea     Patient is a 32 y.o. female presenting with vomiting and diarrhea. The history is provided by the patient. No language interpreter was used.  Emesis Severity:  Moderate Timing:  Intermittent Number of daily episodes:  7 Quality:  Bilious material Progression:  Unchanged Chronicity:  New Recent urination:  Decreased Relieved by:  None tried Worsened by:  Nothing tried Ineffective treatments:  None tried Associated symptoms: abdominal pain and diarrhea   Diarrhea Severity:  Moderate Progression:  Unchanged Relieved by:  None tried Worsened by:  Nothing tried Ineffective treatments:  None tried Associated symptoms: abdominal pain and vomiting   Risk factors: sick contacts    [name] [name] is a 32 y.o. female who presents to the Emergency Department complaining of gradual onset, moderate, non-changing abdominal pain which began 12 hours PTA and radiates downward towards her pelvis. She reports associated diarrhea, nausea and emesis. She denies vaginal discharge, dysuria, or exposure to sick individuals.  Past Medical History  Diagnosis Date  . Migraines     MIGRAINES;HAS TAKEN MAXALT AND VICODIN IN THE PAST OR CAFFEINE  . Depression     HAS TAKEN LEXAPRO 40MG  DAILY BEFORE PREGNANCY;WEANED SELF OFF  . Infection     YEAST INF;TYPICALLY AFTER ANTIBXS;NOT FREQ  . Abnormal Pap smear 2005    PRE-CANCEROUS CELLS ON CX;COLPO DONE AND CELLS "BURNED OFF";LAST PAP 2010 OR 2011,WAS NORMAL    Past Surgical History  Procedure Laterality Date  . Wisdom tooth extraction  1998    ALL 4 EXTRACTED  . Colposcopy    . Cryotherapy    . Cesarean section  01/28/2012    Procedure:  CESAREAN SECTION;  Surgeon: [name] Arthur, MD;  Location: [facility];  Service: Obstetrics;  Laterality: N/A;    Family History  Problem Relation Age of Onset  . Migraines Father     IN THE PAST  . Migraines Paternal Grandfather   . Thyroid disease Mother   . Alzheimer's disease Father     DX'D @ 55    History  Substance Use Topics  . Smoking status: Never Smoker   . Smokeless tobacco: Never Used  . Alcohol Use: No     Comment: RARELY    OB History   Grav Para Term Preterm Abortions TAB SAB Ect Mult Living   1 1 1  0 0 0 0 0 0 1      Review of Systems  Gastrointestinal: Positive for vomiting, abdominal pain and diarrhea.  Genitourinary: Negative for hematuria.  All other systems reviewed and are negative.    Allergies  Review of patient's allergies indicates no known allergies.  Home Medications   Current Outpatient Rx  Name  Route  Sig  Dispense  Refill  . esomeprazole (NEXIUM) 40 MG capsule   Oral   Take 1 capsule (40 mg total) by mouth daily.   30 capsule   3   . ferrous sulfate 325 (65 FE) MG tablet   Oral   Take 1 tablet (325 mg total) by mouth 2 (two) times daily with a meal.   30 tablet   1  Do not take with calcium do not take with prenatal ...   . ibuprofen (ADVIL,MOTRIN) 600 MG tablet   Oral   Take 1 tablet (600 mg total) by mouth every 6 (six) hours as needed.   30 tablet   1     Take with a large glass of water and food   . levonorgestrel (MIRENA) 20 MCG/24HR IUD   Intrauterine   1 each by Intrauterine route once.         . Multiple Vitamins-Minerals (MULTIVITAMIN WITH MINERALS) tablet   Oral   Take 1 tablet by mouth daily.         Marland Kitchen oxyCODONE-acetaminophen (PERCOCET/ROXICET) 5-325 MG per tablet   Oral   Take 1 tablet by mouth every 4 (four) hours as needed (moderate - severe pain).   30 tablet   0   . Prenatal Vit-Fe Fumarate-FA (PRENATAL MULTIVITAMIN) TABS   Oral   Take 1 tablet by mouth daily.         . ranitidine  (ZANTAC) 150 MG tablet   Oral   Take 150 mg by mouth daily as needed. heartburn           BP 128/82  Pulse 107  Temp(Src) 99 F (37.2 C) (Oral)  Resp 16  Ht 4' 11.5" (1.511 m)  Wt 168 lb (76.204 kg)  BMI 33.38 kg/m2  SpO2 100%  LMP 03/06/2012  Breastfeeding? No  Physical Exam  Nursing note and vitals reviewed. Constitutional: She is oriented to person, place, and time. She appears well-developed and well-nourished. No distress.  HENT:  Head: Normocephalic and atraumatic.  Eyes: EOM are normal. Pupils are equal, round, and reactive to light.  Neck: Neck supple. No tracheal deviation present.  Cardiovascular: Normal rate.   Pulmonary/Chest: Effort normal. No respiratory distress.  Abdominal:  RLQ tenderness.  Musculoskeletal: Normal range of motion.  Neurological: She is alert and oriented to person, place, and time.  Skin: Skin is warm and dry.  Psychiatric: She has a normal mood and affect. Her behavior is normal.    ED Course  Procedures (including critical care time)  DIAGNOSTIC STUDIES: Oxygen Saturation is 100% on room air, normal by my interpretation.    COORDINATION OF CARE: 11:56 PMDiscussed treatment plan which includes medication with pt at bedside and pt agreed to plan.   Labs Reviewed  URINALYSIS, ROUTINE W REFLEX MICROSCOPIC  PREGNANCY, URINE   No results found.   No diagnosis found.Patient   MDM  Acute appendicitis.    Patient informed of IUD placement and need for removal.  Patient verbalizes understanding and agrees to follow up Zosyn initiated.  Patient NPO since 12 pm or earlier.     Case d/w Dr. Michaell [name] send to [facility] ED Case d/w Dr. Dierdre [name] will call Dr. Michaell [name] Case d/w [name] [name] charge nurse at [facility] ED Case d/w [name] Harry NP for Dr. Estanislado Pandy will arrange close follow up for IUD removal   I personally performed the services described in this documentation, which was scribed in my presence. The recorded information has been reviewed and is  accurate.           [name] Laborde Smitty Cords, MD 04/01/12 0200

## 2012-04-01 ENCOUNTER — Encounter (HOSPITAL_COMMUNITY): Admission: EM | Disposition: A | Discharge status: Home / Self Care | Payer: Self-pay | Source: Home / Self Care

## 2012-04-01 ENCOUNTER — Emergency Department (HOSPITAL_COMMUNITY): Payer: 59 | Admitting: Anesthesiology

## 2012-04-01 ENCOUNTER — Encounter (HOSPITAL_COMMUNITY): Payer: Self-pay | Admitting: Anesthesiology

## 2012-04-01 ENCOUNTER — Telehealth: Payer: Self-pay

## 2012-04-01 ENCOUNTER — Emergency Department (HOSPITAL_BASED_OUTPATIENT_CLINIC_OR_DEPARTMENT_OTHER): Payer: 59

## 2012-04-01 DIAGNOSIS — K3580 Unspecified acute appendicitis: Secondary | ICD-10-CM | POA: Diagnosis present

## 2012-04-01 DIAGNOSIS — T8332XA Displacement of intrauterine contraceptive device, initial encounter: Secondary | ICD-10-CM

## 2012-04-01 HISTORY — PX: LAPAROSCOPIC APPENDECTOMY: SHX408

## 2012-04-01 LAB — COMPREHENSIVE METABOLIC PANEL
ALT: 16 U/L (ref 0–35)
Albumin: 4 g/dL (ref 3.5–5.2)
Alkaline Phosphatase: 116 U/L (ref 39–117)
GFR calc Af Amer: >90 mL/min (ref >90)
Glucose, Bld: 138 mg/dL — ABNORMAL HIGH (ref 70–99)
Potassium: 3.6 mEq/L (ref 3.5–5.1)
Sodium: 137 mEq/L (ref 135–145)
Total Protein: 7.9 g/dL (ref 6.0–8.3)

## 2012-04-01 LAB — URINALYSIS, ROUTINE W REFLEX MICROSCOPIC
Leukocytes, UA: NEGATIVE
Nitrite: NEGATIVE
Specific Gravity, Urine: 1.027 (ref 1.005–1.030)
pH: 5 (ref 5.0–8.0)

## 2012-04-01 LAB — CBC WITH DIFFERENTIAL/PLATELET
Eosinophils Absolute: 0 10*3/uL (ref 0.0–0.7)
Lymphocytes Relative: 7 % — ABNORMAL LOW (ref 12–46)
Lymphs Abs: 1.2 10*3/uL (ref 0.7–4.0)
MCH: 27.3 pg (ref 26.0–34.0)
Neutro Abs: 15.4 10*3/uL — ABNORMAL HIGH (ref 1.7–7.7)
Neutrophils Relative %: 89 % — ABNORMAL HIGH (ref 43–77)
Platelets: 245 10*3/uL (ref 150–400)
RBC: 4.79 MIL/uL (ref 3.87–5.11)
WBC: 17.2 10*3/uL — ABNORMAL HIGH (ref 4.0–10.5)

## 2012-04-01 LAB — PREGNANCY, URINE: Preg Test, Ur: NEGATIVE

## 2012-04-01 LAB — URINE MICROSCOPIC-ADD ON

## 2012-04-01 SURGERY — APPENDECTOMY, LAPAROSCOPIC
Anesthesia: General | Site: Abdomen | Wound class: Dirty or Infected

## 2012-04-01 MED ORDER — IOHEXOL 300 MG/ML  SOLN
50.0000 mL | Freq: Once | INTRAMUSCULAR | Status: AC | PRN
  Administered 2012-04-01: 50 mL via INTRAVENOUS

## 2012-04-01 MED ORDER — FENTANYL CITRATE 0.05 MG/ML IJ SOLN: 25.0000 ug | INTRAMUSCULAR | Status: DC | PRN

## 2012-04-01 MED ORDER — ACETAMINOPHEN 325 MG PO TABS: 325.0000 mg | ORAL_TABLET | Freq: Four times a day (QID) | ORAL | Status: DC | PRN

## 2012-04-01 MED ORDER — PIPERACILLIN-TAZOBACTAM 3.375 G IVPB
3.3750 g | Freq: Once | INTRAVENOUS | Status: AC
  Administered 2012-04-01: 3.375 g via INTRAVENOUS
  Filled 2012-04-01: qty 50

## 2012-04-01 MED ORDER — MORPHINE SULFATE 2 MG/ML IJ SOLN
2.0000 mg | Freq: Once | INTRAMUSCULAR | Status: AC
  Administered 2012-04-01: 2 mg via INTRAVENOUS
  Filled 2012-04-01: qty 1

## 2012-04-01 MED ORDER — HYDROMORPHONE HCL PF 1 MG/ML IJ SOLN: 0.5000 mg | INTRAMUSCULAR | Status: DC | PRN

## 2012-04-01 MED ORDER — ALUM & MAG HYDROXIDE-SIMETH 200-200-20 MG/5ML PO SUSP: 30.0000 mL | Freq: Four times a day (QID) | ORAL | Status: DC | PRN

## 2012-04-01 MED ORDER — LACTATED RINGERS IV SOLN: INTRAVENOUS | Status: DC

## 2012-04-01 MED ORDER — AMOXICILLIN-POT CLAVULANATE 875-125 MG PO TABS: 1.0000 | ORAL_TABLET | Freq: Two times a day (BID) | ORAL | Status: DC

## 2012-04-01 MED ORDER — HYDROMORPHONE HCL PF 1 MG/ML IJ SOLN
INTRAMUSCULAR | Status: DC | PRN
  Administered 2012-04-01 (×2): 1 mg via INTRAVENOUS

## 2012-04-01 MED ORDER — LACTATED RINGERS IV BOLUS (SEPSIS): 1000.0000 mL | Freq: Three times a day (TID) | INTRAVENOUS | Status: DC | PRN

## 2012-04-01 MED ORDER — HYDROMORPHONE HCL 2 MG PO TABS
2.0000 mg | ORAL_TABLET | ORAL | Status: DC | PRN
  Administered 2012-04-01: 2 mg via ORAL
  Filled 2012-04-01: qty 1

## 2012-04-01 MED ORDER — ONDANSETRON HCL 4 MG/2ML IJ SOLN
INTRAMUSCULAR | Status: AC
  Administered 2012-04-01: 4 mg via INTRAVENOUS
  Filled 2012-04-01: qty 2

## 2012-04-01 MED ORDER — IOHEXOL 300 MG/ML  SOLN
100.0000 mL | Freq: Once | INTRAMUSCULAR | Status: AC | PRN
  Administered 2012-04-01: 100 mL via INTRAVENOUS

## 2012-04-01 MED ORDER — MAGIC MOUTHWASH
15.0000 mL | Freq: Four times a day (QID) | ORAL | Status: DC | PRN
  Filled 2012-04-01: qty 15

## 2012-04-01 MED ORDER — SODIUM CHLORIDE 0.9 % IJ SOLN: 3.0000 mL | Freq: Two times a day (BID) | INTRAMUSCULAR | Status: DC

## 2012-04-01 MED ORDER — KETOROLAC TROMETHAMINE 30 MG/ML IJ SOLN
INTRAMUSCULAR | Status: DC | PRN
  Administered 2012-04-01: 30 mg via INTRAVENOUS

## 2012-04-01 MED ORDER — GLYCOPYRROLATE 0.2 MG/ML IJ SOLN
INTRAMUSCULAR | Status: DC | PRN
  Administered 2012-04-01: 0.4 mg via INTRAVENOUS

## 2012-04-01 MED ORDER — ADULT MULTIVITAMIN W/MINERALS CH
1.0000 | ORAL_TABLET | Freq: Every day | ORAL | Status: DC
  Administered 2012-04-01: 1 via ORAL
  Filled 2012-04-01: qty 1

## 2012-04-01 MED ORDER — DEXTROSE 5 % IV SOLN: 2.0000 g | Freq: Three times a day (TID) | INTRAVENOUS | Status: DC

## 2012-04-01 MED ORDER — FAMOTIDINE 20 MG PO TABS
20.0000 mg | ORAL_TABLET | Freq: Every day | ORAL | Status: DC
  Administered 2012-04-01: 20 mg via ORAL
  Filled 2012-04-01 (×2): qty 1

## 2012-04-01 MED ORDER — ONDANSETRON HCL 4 MG/2ML IJ SOLN
4.0000 mg | Freq: Once | INTRAMUSCULAR | Status: AC
  Administered 2012-04-01: 4 mg via INTRAVENOUS
  Filled 2012-04-01: qty 2

## 2012-04-01 MED ORDER — DEXTROSE 5 % IV SOLN
2.0000 g | Freq: Three times a day (TID) | INTRAVENOUS | Status: DC
  Administered 2012-04-01: 2 g via INTRAVENOUS
  Filled 2012-04-01 (×3): qty 2

## 2012-04-01 MED ORDER — ACETAMINOPHEN 650 MG RE SUPP: 650.0000 mg | Freq: Four times a day (QID) | RECTAL | Status: DC | PRN

## 2012-04-01 MED ORDER — PROMETHAZINE HCL 25 MG/ML IJ SOLN
6.2500 mg | INTRAMUSCULAR | Status: AC | PRN
  Administered 2012-04-01 (×2): 6.25 mg via INTRAVENOUS

## 2012-04-01 MED ORDER — LACTATED RINGERS IR SOLN
Status: DC | PRN
  Administered 2012-04-01: 1000 mL

## 2012-04-01 MED ORDER — MORPHINE SULFATE 4 MG/ML IJ SOLN
INTRAMUSCULAR | Status: AC
  Administered 2012-04-01: 4 mg via INTRAVENOUS
  Filled 2012-04-01: qty 1

## 2012-04-01 MED ORDER — BISACODYL 10 MG RE SUPP: 10.0000 mg | Freq: Two times a day (BID) | RECTAL | Status: DC | PRN

## 2012-04-01 MED ORDER — MIDAZOLAM HCL 5 MG/5ML IJ SOLN
INTRAMUSCULAR | Status: DC | PRN
  Administered 2012-04-01: 0.5 mg via INTRAVENOUS

## 2012-04-01 MED ORDER — SODIUM CHLORIDE 0.9 % IV SOLN: 250.0000 mL | INTRAVENOUS | Status: DC | PRN

## 2012-04-01 MED ORDER — IBUPROFEN 600 MG PO TABS
600.0000 mg | ORAL_TABLET | Freq: Four times a day (QID) | ORAL | Status: DC | PRN
  Filled 2012-04-01: qty 1

## 2012-04-01 MED ORDER — EPHEDRINE SULFATE 50 MG/ML IJ SOLN
INTRAMUSCULAR | Status: DC | PRN
  Administered 2012-04-01: 5 mg via INTRAVENOUS

## 2012-04-01 MED ORDER — FLUCONAZOLE 200 MG PO TABS
200.0000 mg | ORAL_TABLET | Freq: Every day | ORAL | Status: DC
  Filled 2012-04-01: qty 1

## 2012-04-01 MED ORDER — MEPERIDINE HCL 50 MG/ML IJ SOLN: 6.2500 mg | INTRAMUSCULAR | Status: DC | PRN

## 2012-04-01 MED ORDER — LACTATED RINGERS IV SOLN
INTRAVENOUS | Status: DC | PRN
  Administered 2012-04-01 (×2): via INTRAVENOUS

## 2012-04-01 MED ORDER — DIPHENHYDRAMINE HCL 50 MG/ML IJ SOLN
12.5000 mg | Freq: Four times a day (QID) | INTRAMUSCULAR | Status: DC | PRN
  Administered 2012-04-01: 12:00:00 via INTRAVENOUS
  Filled 2012-04-01: qty 1

## 2012-04-01 MED ORDER — PROMETHAZINE HCL 25 MG/ML IJ SOLN
INTRAMUSCULAR | Status: AC
  Filled 2012-04-01: qty 1

## 2012-04-01 MED ORDER — BUPIVACAINE-EPINEPHRINE 0.25% -1:200000 IJ SOLN
INTRAMUSCULAR | Status: DC | PRN
  Administered 2012-04-01: 50 mL

## 2012-04-01 MED ORDER — LIP MEDEX EX OINT
1.0000 "application " | TOPICAL_OINTMENT | Freq: Two times a day (BID) | CUTANEOUS | Status: DC
  Filled 2012-04-01: qty 7

## 2012-04-01 MED ORDER — ACETAMINOPHEN 10 MG/ML IV SOLN
INTRAVENOUS | Status: DC | PRN
  Administered 2012-04-01: 1000 mg via INTRAVENOUS

## 2012-04-01 MED ORDER — METOPROLOL TARTRATE 1 MG/ML IV SOLN
5.0000 mg | Freq: Four times a day (QID) | INTRAVENOUS | Status: DC | PRN
  Filled 2012-04-01: qty 5

## 2012-04-01 MED ORDER — MENTHOL 3 MG MT LOZG
1.0000 | LOZENGE | OROMUCOSAL | Status: DC | PRN
  Filled 2012-04-01: qty 9

## 2012-04-01 MED ORDER — HYDROMORPHONE HCL 2 MG PO TABS: 2.0000 mg | ORAL_TABLET | ORAL | Status: DC | PRN

## 2012-04-01 MED ORDER — OXYCODONE HCL 5 MG PO TABS: 5.0000 mg | ORAL_TABLET | ORAL | Status: DC | PRN

## 2012-04-01 MED ORDER — SUCCINYLCHOLINE CHLORIDE 20 MG/ML IJ SOLN
INTRAMUSCULAR | Status: DC | PRN
  Administered 2012-04-01: 120 mg via INTRAVENOUS

## 2012-04-01 MED ORDER — BUPIVACAINE-EPINEPHRINE 0.25% -1:200000 IJ SOLN
INTRAMUSCULAR | Status: AC
  Filled 2012-04-01: qty 1

## 2012-04-01 MED ORDER — SODIUM CHLORIDE 0.9 % IV SOLN
Freq: Once | INTRAVENOUS | Status: AC
Start: 2012-04-01 — End: 2012-04-01
  Administered 2012-04-01: 02:00:00 via INTRAVENOUS

## 2012-04-01 MED ORDER — MORPHINE SULFATE 4 MG/ML IJ SOLN
4.0000 mg | Freq: Once | INTRAMUSCULAR | Status: AC
Start: 2012-04-01 — End: 2012-04-01
  Administered 2012-04-01: 4 mg via INTRAVENOUS
  Filled 2012-04-01: qty 1

## 2012-04-01 MED ORDER — NEOSTIGMINE METHYLSULFATE 1 MG/ML IJ SOLN
INTRAMUSCULAR | Status: DC | PRN
  Administered 2012-04-01: 3 mg via INTRAVENOUS

## 2012-04-01 MED ORDER — OXYCODONE-ACETAMINOPHEN 5-325 MG PO TABS: ORAL_TABLET | ORAL | Status: DC

## 2012-04-01 MED ORDER — MORPHINE SULFATE 4 MG/ML IJ SOLN
4.0000 mg | Freq: Once | INTRAMUSCULAR | Status: AC
  Administered 2012-04-01: 4 mg via INTRAVENOUS

## 2012-04-01 MED ORDER — PROPOFOL 10 MG/ML IV EMUL
INTRAVENOUS | Status: DC | PRN
  Administered 2012-04-01: 180 mg via INTRAVENOUS

## 2012-04-01 MED ORDER — SODIUM CHLORIDE 0.9 % IV SOLN: INTRAVENOUS | Status: DC

## 2012-04-01 MED ORDER — ONDANSETRON HCL 4 MG/2ML IJ SOLN
4.0000 mg | Freq: Once | INTRAMUSCULAR | Status: AC
  Administered 2012-04-01: 4 mg via INTRAVENOUS

## 2012-04-01 MED ORDER — SODIUM CHLORIDE 0.9 % IJ SOLN: 3.0000 mL | INTRAMUSCULAR | Status: DC | PRN

## 2012-04-01 MED ORDER — LACTATED RINGERS IV BOLUS (SEPSIS)
1000.0000 mL | Freq: Once | INTRAVENOUS | Status: AC
  Administered 2012-04-01: 1000 mL via INTRAVENOUS

## 2012-04-01 MED ORDER — ONDANSETRON HCL 4 MG/2ML IJ SOLN: 4.0000 mg | Freq: Four times a day (QID) | INTRAMUSCULAR | Status: DC | PRN

## 2012-04-01 MED ORDER — FENTANYL CITRATE 0.05 MG/ML IJ SOLN
INTRAMUSCULAR | Status: DC | PRN
  Administered 2012-04-01: 50 ug via INTRAVENOUS

## 2012-04-01 MED ORDER — PROMETHAZINE HCL 25 MG/ML IJ SOLN
12.5000 mg | Freq: Four times a day (QID) | INTRAMUSCULAR | Status: DC | PRN
  Filled 2012-04-01: qty 1

## 2012-04-01 MED ORDER — CISATRACURIUM BESYLATE (PF) 10 MG/5ML IV SOLN
INTRAVENOUS | Status: DC | PRN
  Administered 2012-04-01: 7 mg via INTRAVENOUS

## 2012-04-01 MED ORDER — LIDOCAINE HCL (CARDIAC) 20 MG/ML IV SOLN
INTRAVENOUS | Status: DC | PRN
  Administered 2012-04-01: 20 mg via INTRAVENOUS

## 2012-04-01 MED ORDER — ACETAMINOPHEN 325 MG PO TABS
325.0000 mg | ORAL_TABLET | Freq: Four times a day (QID) | ORAL | Status: DC | PRN
Start: 2012-04-01 — End: 2012-04-01

## 2012-04-01 MED ORDER — ONDANSETRON HCL 4 MG/2ML IJ SOLN
INTRAMUSCULAR | Status: DC | PRN
  Administered 2012-04-01 (×2): 2 mg via INTRAVENOUS

## 2012-04-01 SURGICAL SUPPLY — 42 items
APPLIER CLIP 5 13 M/L LIGAMAX5 (MISCELLANEOUS)
APPLIER CLIP ROT 10 11.4 M/L (STAPLE)
BRIEF STRETCH FOR OB PAD LRG (UNDERPADS AND DIAPERS) ×4 IMPLANT
CANISTER SUCTION 2500CC (MISCELLANEOUS) ×2 IMPLANT
CLIP APPLIE 5 13 M/L LIGAMAX5 (MISCELLANEOUS) IMPLANT
CLIP APPLIE ROT 10 11.4 M/L (STAPLE) IMPLANT
CLOTH BEACON ORANGE TIMEOUT ST (SAFETY) ×2 IMPLANT
CUTTER FLEX LINEAR 45M (STAPLE) ×2 IMPLANT
DECANTER SPIKE VIAL GLASS SM (MISCELLANEOUS) ×2 IMPLANT
DRAPE LAPAROSCOPIC ABDOMINAL (DRAPES) ×2 IMPLANT
DRAPE UTILITY XL STRL (DRAPES) ×2 IMPLANT
DRSG TEGADERM 2-3/8X2-3/4 SM (GAUZE/BANDAGES/DRESSINGS) ×4 IMPLANT
DRSG TEGADERM 4X4.75 (GAUZE/BANDAGES/DRESSINGS) ×2 IMPLANT
ELECT REM PT RETURN 9FT ADLT (ELECTROSURGICAL) ×2
ELECTRODE REM PT RTRN 9FT ADLT (ELECTROSURGICAL) ×1 IMPLANT
ENDOLOOP SUT PDS II  0 18 (SUTURE) ×1
ENDOLOOP SUT PDS II 0 18 (SUTURE) ×1 IMPLANT
GAUZE SPONGE 2X2 8PLY STRL LF (GAUZE/BANDAGES/DRESSINGS) ×1 IMPLANT
GLOVE BIOGEL PI IND STRL 7.0 (GLOVE) ×1 IMPLANT
GLOVE BIOGEL PI INDICATOR 7.0 (GLOVE) ×1
GLOVE ECLIPSE 8.0 STRL XLNG CF (GLOVE) ×2 IMPLANT
GLOVE INDICATOR 8.0 STRL GRN (GLOVE) ×4 IMPLANT
GOWN STRL NON-REIN LRG LVL3 (GOWN DISPOSABLE) IMPLANT
GOWN STRL REIN XL XLG (GOWN DISPOSABLE) ×8 IMPLANT
KIT BASIN OR (CUSTOM PROCEDURE TRAY) ×2 IMPLANT
NS IRRIG 1000ML POUR BTL (IV SOLUTION) ×2 IMPLANT
PENCIL BUTTON HOLSTER BLD 10FT (ELECTRODE) IMPLANT
POUCH SPECIMEN RETRIEVAL 10MM (ENDOMECHANICALS) ×2 IMPLANT
RELOAD 45 VASCULAR/THIN (ENDOMECHANICALS) IMPLANT
RELOAD STAPLE TA45 3.5 REG BLU (ENDOMECHANICALS) ×2 IMPLANT
SET IRRIG TUBING LAPAROSCOPIC (IRRIGATION / IRRIGATOR) ×2 IMPLANT
SOLUTION ANTI FOG 6CC (MISCELLANEOUS) ×2 IMPLANT
SPONGE GAUZE 2X2 STER 10/PKG (GAUZE/BANDAGES/DRESSINGS) ×1
SUT MNCRL AB 4-0 PS2 18 (SUTURE) ×2 IMPLANT
TOWEL OR 17X26 10 PK STRL BLUE (TOWEL DISPOSABLE) ×2 IMPLANT
TOWEL OR NON WOVEN STRL DISP B (DISPOSABLE) ×2 IMPLANT
TRAY FOLEY CATH 14FRSI W/METER (CATHETERS) ×2 IMPLANT
TRAY LAP CHOLE (CUSTOM PROCEDURE TRAY) ×2 IMPLANT
TROCAR BLADELESS OPT 5 100 (ENDOMECHANICALS) ×2 IMPLANT
TROCAR XCEL 12X100 BLDLESS (ENDOMECHANICALS) ×2 IMPLANT
TROCAR XCEL BLADELESS 5X75MML (TROCAR) ×2 IMPLANT
TUBING INSUFFLATION 10FT LAP (TUBING) ×2 IMPLANT

## 2012-04-01 NOTE — ED Notes (Signed)
MD at bedside giving test results and plan of care. 

## 2012-04-01 NOTE — ED Provider Notes (Signed)
I evaluated PT for R sided ABD pain, N/V/D, transferred from [facility] for appendicitis, but not accepted by DR Gross with [facility].  She has TTP RLQ with pain improving and nausea only minimal at this time. IV morphine and zofran provided. Ct scan reviewed and Dr Michaell Cowing consulted. 0530 - He agrees to see PT in the ED. Plan admit [facility].  Results for orders placed during the hospital encounter of 03/31/12  URINALYSIS, ROUTINE W REFLEX MICROSCOPIC      Result Value Range   Color, Urine YELLOW  YELLOW   APPearance CLOUDY (*) CLEAR   Specific Gravity, Urine 1.027  1.005 - 1.030   pH 5.0  5.0 - 8.0   Glucose, UA NEGATIVE  NEGATIVE mg/dL   Hgb urine dipstick LARGE (*) NEGATIVE   Bilirubin Urine SMALL (*) NEGATIVE   Ketones, ur 40 (*) NEGATIVE mg/dL   Protein, ur NEGATIVE  NEGATIVE mg/dL   Urobilinogen, UA 0.2  0.0 - 1.0 mg/dL   Nitrite NEGATIVE  NEGATIVE   Leukocytes, UA NEGATIVE  NEGATIVE  PREGNANCY, URINE      Result Value Range   Preg Test, Ur NEGATIVE  NEGATIVE  CBC WITH DIFFERENTIAL      Result Value Range   WBC 17.2 (*) 4.0 - 10.5 K/uL   RBC 4.79  3.87 - 5.11 MIL/uL   Hemoglobin 13.1  12.0 - 15.0 g/dL   HCT 45.4  09.8 - 11.9 %   MCV 82.5  78.0 - 100.0 fL   MCH 27.3  26.0 - 34.0 pg   MCHC 33.2  30.0 - 36.0 g/dL   RDW 14.7  82.9 - 56.2 %   Platelets 245  150 - 400 K/uL   Neutrophils Relative 89 (*) 43 - 77 %   Neutro Abs 15.4 (*) 1.7 - 7.7 K/uL   Lymphocytes Relative 7 (*) 12 - 46 %   Lymphs Abs 1.2  0.7 - 4.0 K/uL   Monocytes Relative 4  3 - 12 %   Monocytes Absolute 0.6  0.1 - 1.0 K/uL   Eosinophils Relative 0  0 - 5 %   Eosinophils Absolute 0.0  0.0 - 0.7 K/uL   Basophils Relative 0  0 - 1 %   Basophils Absolute 0.0  0.0 - 0.1 K/uL  COMPREHENSIVE METABOLIC PANEL      Result Value Range   Sodium 137  135 - 145 mEq/L   Potassium 3.6  3.5 - 5.1 mEq/L   Chloride 100  96 - 112 mEq/L   CO2 24  19 - 32 mEq/L   Glucose, Bld 138 (*) 70 - 99 mg/dL   BUN 10  6 - 23 mg/dL   Creatinine, Ser  1.30  0.50 - 1.10 mg/dL   Calcium 9.6  8.4 - 86.5 mg/dL   Total Protein 7.9  6.0 - 8.3 g/dL   Albumin 4.0  3.5 - 5.2 g/dL   AST 22  0 - 37 U/L   ALT 16  0 - 35 U/L   Alkaline Phosphatase 116  39 - 117 U/L   Total Bilirubin 0.3  0.3 - 1.2 mg/dL   GFR calc non Af Amer >90  >90 mL/min   GFR calc Af Amer >90  >90 mL/min  URINE MICROSCOPIC-ADD ON      Result Value Range   Squamous Epithelial / LPF FEW (*) RARE   WBC, UA 0-2  <3 WBC/hpf   RBC / HPF 7-10  <3 RBC/hpf   Bacteria, UA FEW (*)  RARE   Urine-Other LESS THAN 10 mL OF URINE SUBMITTED     Ct Abdomen Pelvis W Contrast  04/01/2012  *RADIOLOGY REPORT*  Clinical Data: Right lower quadrant abdominal pain, nausea and vomiting.  CT ABDOMEN AND PELVIS WITH CONTRAST  Technique:  Multidetector CT imaging of the abdomen and pelvis was performed following the standard protocol during bolus administration of intravenous contrast.  Contrast: 100 mL of Omnipaque 300 IV contrast  Comparison: Right lower quadrant abdominal and renal ultrasound performed 07/07/2011  Findings: The visualized lung bases are clear.  The liver and spleen are unremarkable in appearance.  The gallbladder is within normal limits.  The pancreas and adrenal glands are unremarkable.  The kidneys are unremarkable in appearance.  There is no evidence of hydronephrosis.  No renal or ureteral stones are seen.  No perinephric stranding is appreciated.  The small bowel is unremarkable in appearance.  The stomach is within normal limits.  No acute vascular abnormalities are seen.  The appendix is distended up to 1.3 cm in maximal diameter, with associated soft tissue inflammation and trace free fluid.  Several appendicoliths are noted within the appendix.  This is compatible with acute appendicitis.  There is no evidence of perforation or abscess formation.  The appendix extends inferior to the cecum.  The colon is unremarkable in appearance.  Trace free fluid within the pelvis may be physiologic  in nature.  The bladder is mildly distended and grossly unremarkable.  The uterus is within normal limits.  The intrauterine device is somewhat unusually angulated, though at the fundus; it may be extending into the myometrium both anteriorly and posteriorly, nearly to the serosa posteriorly. The ovaries are grossly symmetric; no suspicious adnexal masses are seen.  No inguinal lymphadenopathy is seen.  No acute osseous abnormalities are identified.  IMPRESSION:  1.  Acute appendicitis noted, with distension of the appendix to 1.3 cm in maximal diameter, and associated soft tissue inflammation and trace free fluid.  Several small appendicoliths noted in the appendix.  No evidence of perforation or abscess formation. 2.  Somewhat unusual angulation of the intrauterine device, though it is at the fundus.  It may be extending into the myometrium both anteriorly and posteriorly, nearly extending to the serosa posteriorly.  Would evaluate further on a non-emergent basis, when and as deemed clinically appropriate.  These results were called by telephone on 04/01/2012 at 01:45 a.m. to Dr. Deanna Artis, who verbally acknowledged these results.   Original Report Authenticated By: Tonia Ghent, M.D.       Sunnie Nielsen, MD 04/01/12 [phone]

## 2012-04-01 NOTE — Care Management Note (Signed)
    Page 1 of 1   04/01/2012     11:44:58 AM   CARE MANAGEMENT NOTE 04/01/2012  Patient:  [name] [name], [name] [name]   Account Number:  [id number]  Date Initiated:  04/01/2012  Documentation initiated by:  Lorenda Ishihara  Subjective/Objective Assessment:   32 yo female admitted s/p perf appendix. PTA lived at home.     Action/Plan:   Home when stable   Anticipated DC Date:  04/03/2012   Anticipated DC Plan:  HOME/SELF CARE      DC Planning Services  CM consult      Choice offered to / List presented to:             Status of service:  Completed, signed off Medicare Important Message given?  NO (If response is "NO", the following Medicare IM given date fields will be blank) Date Medicare IM given:   Date Additional Medicare IM given:    Discharge Disposition:  HOME/SELF CARE  Per UR Regulation:  Reviewed for med. necessity/level of care/duration of stay  If discussed at Long Length of Stay Meetings, dates discussed:    Comments:

## 2012-04-01 NOTE — Anesthesia Preprocedure Evaluation (Signed)
Anesthesia Evaluation  Patient identified by MRN, date of birth, ID band Patient awake    Reviewed: Allergy & Precautions, H&P , NPO status , Patient's Chart, lab work & pertinent test results  Airway Mallampati: II TM Distance: >3 FB Neck ROM: Full    Dental no notable dental hx.    Pulmonary neg pulmonary ROS,  breath sounds clear to auscultation  Pulmonary exam normal       Cardiovascular negative cardio ROS  Rhythm:Regular Rate:Normal     Neuro/Psych negative neurological ROS  negative psych ROS   GI/Hepatic negative GI ROS, Neg liver ROS,   Endo/Other  negative endocrine ROS  Renal/GU negative Renal ROS  negative genitourinary   Musculoskeletal negative musculoskeletal ROS (+)   Abdominal   Peds negative pediatric ROS (+)  Hematology negative hematology ROS (+)   Anesthesia Other Findings   Reproductive/Obstetrics negative OB ROS                           Anesthesia Physical Anesthesia Plan  ASA: I and emergent  Anesthesia Plan: General   Post-op Pain Management:    Induction: Intravenous, Rapid sequence and Cricoid pressure planned  Airway Management Planned: Oral ETT  Additional Equipment:   Intra-op Plan:   Post-operative Plan: Extubation in OR  Informed Consent: I have reviewed the patients History and Physical, chart, labs and discussed the procedure including the risks, benefits and alternatives for the proposed anesthesia with the patient or authorized representative who has indicated his/her understanding and acceptance.   Dental advisory given  Plan Discussed with: CRNA  Anesthesia Plan Comments:         Anesthesia Quick Evaluation  

## 2012-04-01 NOTE — ED Notes (Signed)
ZOX:[id number]<AV> Expected date:04/01/12<BR> Expected time: 2:32 AM<BR> Means of arrival:Ambulance<BR> Comments:<BR> Transfer [facility]

## 2012-04-01 NOTE — Telephone Encounter (Signed)
Spoke with pt's fiance explained we are aware pt just had appendectomy surgery this am. Please inform pt to call the office ASAP.

## 2012-04-01 NOTE — ED Notes (Signed)
Medications have not arrived.

## 2012-04-01 NOTE — Telephone Encounter (Signed)
Message copied by Janeece Agee on Tue Apr 01, 2012  8:46 AM ------      Message from: Malissa Hippo.      Created: Tue Apr 01, 2012  2:11 AM      Regarding: Please call Dr Cloretta Ned patient regarding IUD removal        Pt was seen Tuesday morning at [facility] and had an US done and IUD was seen through the myometrium, she is going to [facility] to have appendectomy      They were going to try to remove the IUD, but if they can't she needs to come to the office to have an [place] and to see [place], ASAP            I'm guessing she will be released from hospital later on Tuesday or maybe Wednesday             Thanks      SL  ------

## 2012-04-01 NOTE — Progress Notes (Signed)
Day of Surgery  Subjective: Wants to go home to 49 month old daughter  Objective: Vital signs in last 24 hours: Temp:  [98.2 F (36.8 C)-99.5 F (37.5 C)] 98.9 F (37.2 C) (02/11 1400) Pulse Rate:  [59-107] 70 (02/11 1400) Resp:  [12-20] 18 (02/11 1400) BP: (83-128)/(46-82) 97/66 mmHg (02/11 1400) SpO2:  [93 %-100 %] 95 % (02/11 1400) Weight:  [168 lb (76.204 kg)] 168 lb (76.204 kg) (02/10 2349) Last BM Date: 04/01/12  Intake/Output from previous day:   Intake/Output this shift: Total I/O In: 1420 [P.O.:120; I.V.:1300] Out: 155 [Urine:150; Blood:5]  General appearance: alert, cooperative and no distress GI: soft, tender, dressing are dry. tolerated clears, going for regular tray now.  Lab Results:   Recent Labs  04/01/12 0007  WBC 17.2*  HGB 13.1  HCT 39.5  PLT 245    BMET  Recent Labs  04/01/12 0007  NA 137  K 3.6  CL 100  CO2 24  GLUCOSE 138*  BUN 10  CREATININE 0.80  CALCIUM 9.6   PT/INR No results found for this basename: LABPROT, INR,  in the last 72 hours   Recent Labs Lab 04/01/12 0007  AST 22  ALT 16  ALKPHOS 116  BILITOT 0.3  PROT 7.9  ALBUMIN 4.0     Lipase  No results found for this basename: lipase     Studies/Results: Ct Abdomen Pelvis W Contrast  04/01/2012  *RADIOLOGY REPORT*  Clinical Data: Right lower quadrant abdominal pain, nausea and vomiting.  CT ABDOMEN AND PELVIS WITH CONTRAST  Technique:  Multidetector CT imaging of the abdomen and pelvis was performed following the standard protocol during bolus administration of intravenous contrast.  Contrast: 100 mL of Omnipaque 300 IV contrast  Comparison: Right lower quadrant abdominal and renal ultrasound performed 07/07/2011  Findings: The visualized lung bases are clear.  The liver and spleen are unremarkable in appearance.  The gallbladder is within normal limits.  The pancreas and adrenal glands are unremarkable.  The kidneys are unremarkable in appearance.  There is no  evidence of hydronephrosis.  No renal or ureteral stones are seen.  No perinephric stranding is appreciated.  The small bowel is unremarkable in appearance.  The stomach is within normal limits.  No acute vascular abnormalities are seen.  The appendix is distended up to 1.3 cm in maximal diameter, with associated soft tissue inflammation and trace free fluid.  Several appendicoliths are noted within the appendix.  This is compatible with acute appendicitis.  There is no evidence of perforation or abscess formation.  The appendix extends inferior to the cecum.  The colon is unremarkable in appearance.  Trace free fluid within the pelvis may be physiologic in nature.  The bladder is mildly distended and grossly unremarkable.  The uterus is within normal limits.  The intrauterine device is somewhat unusually angulated, though at the fundus; it may be extending into the myometrium both anteriorly and posteriorly, nearly to the serosa posteriorly. The ovaries are grossly symmetric; no suspicious adnexal masses are seen.  No inguinal lymphadenopathy is seen.  No acute osseous abnormalities are identified.  IMPRESSION:  1.  Acute appendicitis noted, with distension of the appendix to 1.3 cm in maximal diameter, and associated soft tissue inflammation and trace free fluid.  Several small appendicoliths noted in the appendix.  No evidence of perforation or abscess formation. 2.  Somewhat unusual angulation of the intrauterine device, though it is at the fundus.  It may be extending into the myometrium  both anteriorly and posteriorly, nearly extending to the serosa posteriorly.  Would evaluate further on a non-emergent basis, when and as deemed clinically appropriate.  These results were called by telephone on 04/01/2012 at 01:45 a.m. to Dr. Deanna Artis, who verbally acknowledged these results.   Original Report Authenticated By: Tonia Ghent, M.D.     Medications: . cefOXitin  2 g Intravenous Q8H  . famotidine   20 mg Oral Daily  . fluconazole  200 mg Oral Daily  . lip balm  1 application Topical BID  . multivitamin with minerals  1 tablet Oral Daily  . promethazine      . sodium chloride  3 mL Intravenous Q12H    Assessment/Plan Acute appendicitis with phlegmon and possible necrosis/perforation focally Hx of migraines Hx of Depression C Section 01/28/12    Plan:  If she tolerated regular tray she wants to go home tonight.  We will send her home on 1 week of Augmentin and follow up in 2 weeks.   LOS: 1 day    [name] [name] 04/01/2012

## 2012-04-01 NOTE — ED Notes (Signed)
Arline Asp, RN informed me that pt can get Diflucan despite being NPO.
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
## 2012-04-01 NOTE — Transfer of Care (Signed)
Immediate Anesthesia Transfer of Care Note  Patient: [name] [name]  Procedure(s) Performed: Procedure(s): APPENDECTOMY LAPAROSCOPIC (N/A)  Patient Location: PACU  Anesthesia Type:General  Level of Consciousness: awake, alert , oriented and patient cooperative  Airway & Oxygen Therapy: Patient Spontanous Breathing, Patient connected to nasal cannula oxygen and Patient connected to face mask oxygen  Post-op Assessment: Report given to PACU RN and Post -op Vital signs reviewed and stable  Post vital signs: Reviewed and stable  Complications: No apparent anesthesia complications

## 2012-04-01 NOTE — Discharge Summary (Signed)
Physician Discharge Summary  Patient ID: [name] [name] MRN: [id number] DOB/AGE: [birth year] 32 y.o.  Admit date: 03/31/2012 Discharge date: 04/01/2012  Admission Diagnoses: Acute appendicitis Hx of migraines  Hx of Depression  C Section 01/28/12  Discharge Diagnoses: Acute appendicitis with phlegmon and possible necrosis/perforation focally Hx of migraines  Hx of Depression  C Section 01/28/12  Active Problems:   Acute appendicitis   Malpositioned IUD   PROCEDURES: APPENDECTOMY LAPAROSCOPIC, 04/01/2012, Ardeth Sportsman, MD     Hospital Course: Pleasant obese female who began to have abdominal pain loose stools and nausea yesterday. Pain became more intense. Began to have vomiting. Pain migrated more right lower side. She was concerned. She her husband went to the emergency room [facility]. Symptoms treated. Workup by CAT scan suspicious for appendicitis. Surgical consultation requested. Patient transferred to ED of [facility] where surgeon was available.  No personal nor family history of GI/colon cancer, inflammatory bowel disease, irritable bowel syndrome, allergy such as Celiac Sprue, dietary/dairy problems, colitis, ulcers nor gastritis. No recent sick contacts/gastroenteritis. No travel outside the country. No changes in diet. History of some heartburn in the past. Underwent upper endoscopy by Dr. Marcelle Overlie two years ago. Otherwise underwhelming.  Underwent cesarean section in December. Underwent placement of an IUD last month. Has had some mild vaginal bleeding/spotting. Not intense. No discharge. No dysuria or hematuria. No urinary tract infections. Question of yeast infection in the past but not now. Her husband is in the room. She reports some question of appendicitis, back in May with her pregnancy, but WBC improved and she was sent home. She was seen by Dr. Michaell Cowing and taken to the OR .  She was found to have:Acute appendicitis with phlegmon and possible  necrosis/perforation focally.  Post op she has done well and wants to go home.  We plan to send her home with 1 week of antibiotics.  She is aware of the possibility of an abscess, but is willing to follow up as needed.  She was also found to have an IUD with a question of malposition.   We discussed this with her and she is going to follow up with her OB-Gyn provider, Dr. Estanislado Pandy.  Condition on d/c:  Improved   Disposition: 01-Home or Self Care     Medication List    TAKE these medications       acetaminophen 325 MG tablet  Commonly known as:  TYLENOL  Take 1-2 tablets (325-650 mg total) by mouth every 6 (six) hours as needed.     amoxicillin-clavulanate 875-125 MG per tablet  Commonly known as:  AUGMENTIN  Take 1 tablet by mouth every 12 (twelve) hours.     ibuprofen 200 MG tablet  Commonly known as:  ADVIL,MOTRIN  Take 600 mg by mouth every 6 (six) hours as needed for pain.     levonorgestrel 20 MCG/24HR IUD  Commonly known as:  MIRENA  1 each by Intrauterine route once.     multivitamin with minerals tablet  Take 1 tablet by mouth daily.     oxyCODONE-acetaminophen 5-325 MG per tablet  Commonly known as:  PERCOCET/ROXICET  Do not use this if your using your previously prescribed dilaudid.     ranitidine 150 MG tablet  Commonly known as:  ZANTAC  Take 150 mg by mouth daily as needed. heartburn           Follow-up Information   Follow up with [facility]  In 2 weeks.   Contact information:   [address]   [place] [address] [phone]       Follow up with Ardeth Sportsman., MD In 2 weeks. (If they cannot fit you in the DOW see DR. GRoss)    Contact information:   [address] [place] [address] [phone]       Please follow up. (No lifting over 20 pounds for 2 weeks.)       Follow up with DURAN,MICHAEL R, PA. (As needed)    Contact information:   [facility]  [address] [place]  [id number] [phone]       Signed: Sherrie George 04/01/2012, 4:56 PM

## 2012-04-01 NOTE — Op Note (Signed)
6:59 AM  PATIENT:  [name] [name]  32 y.o. female  Patient Care Team: Coralee Rud, PA as PCP - General (Cardiology) Esmeralda Arthur, MD as Consulting Physician (Obstetrics and Gynecology)  PRE-OPERATIVE DIAGNOSIS:  appendicitis  POST-OPERATIVE DIAGNOSIS:  Acute appendicitis with phlegmon and possible necrosis/perforation focally  PROCEDURE:  Procedure(s): APPENDECTOMY LAPAROSCOPIC  SURGEON:  Surgeon(s): Ardeth Sportsman, MD  ASSISTANT: RN   ANESTHESIA:   local and general  EBL:     Delay start of Pharmacological VTE agent (>24hrs) due to surgical blood loss or risk of bleeding:  no  DRAINS: none   SPECIMEN:  Source of Specimen:  Appendix  DISPOSITION OF SPECIMEN:  PATHOLOGY  COUNTS:  YES  PLAN OF CARE: Admit for overnight observation  PATIENT DISPOSITION:  PACU - hemodynamically stable.   INDICATIONS: Patient with concerning symptoms & work up suspicious for appendicitis.  Surgery was recommended:  The anatomy & physiology of the digestive tract was discussed.  The pathophysiology of appendicitis was discussed.  Natural history risks without surgery was discussed.   I feel the risks of no intervention will lead to serious problems that outweigh the operative risks; therefore, I recommended diagnostic laparoscopy with removal of appendix to remove the pathology.  Laparoscopic & open techniques were discussed.   I noted a good likelihood this will help address the problem.    Risks such as bleeding, infection, abscess, leak, reoperation, possible ostomy, hernia, heart attack, death, and other risks were discussed.  Goals of post-operative recovery were discussed as well.  We will work to minimize complications.  Questions were answered.  The patient expresses understanding & wishes to proceed with surgery.  OR FINDINGS: Assured but inflamed appendix with phlegmon.  Patch of necrosis suspicious for early perforation.  No major abscess.  Midline lower omental adhesions.   No evidence of hernia.  No evidence of IUD perforation thru uterus.  DESCRIPTION:   The patient was identified & brought into the operating room. The patient was positioned supine with arms tucked. SCDs were active during the entire case. The patient underwent general anesthesia without any difficulty.  The abdomen was prepped and draped in a sterile fashion. A Surgical Timeout confirmed our plan.  I made a transverse incision through the inferior umbilical fold.  I made a small transverse nick through the infraumbilical fascia and confirmed peritoneal entry.  I placed a 5mm port.  We induced carbon dioxide insufflation.  Camera inspection revealed no injury.  She had a lower midline omental adhesions to the central part of her abdomen.  I therefore placed the ports a little more cephalad to stay away from the lesions.  I placed additional ports under direct laparoscopic visualization.  I mobilized the terminal ileum to proximal ascending colon in a lateral to medial fashion.  I took care to avoid injuring any retroperitoneal structures.  I freed the appendix off its attachments to the ascending colon and cecal mesentery.  I elevated the appendix. . I was able to free off the base of the appendix which was still viable.  I stapled the appendix off the cecum using a laparoscopic stapler. I took a healthy cuff viable cecum. I skeletonized & ligated the mesoappendix. I placed the appendix inside an EndoCatch bag and removed out the 12 mm port.  I did copious irrigation. Hemostasis was good in the mesoappendix, colon mesentery, and retroperitoneum. Staple line was intact on the cecum with no bleeding. I washed out the pelvis, retrohepatic space and right  paracolic gutter. I washed out the left side as well.  Hemostasis is good. There was no perforation or injury. Because the area cleaned up well after irrigation, I did not place a drain.  I aspirated the carbon dioxide. I removed the ports. I closed the 12  mm fascia site using a 0 Vicryl stitch. I closed skin using 4-0 monocryl stitch.  Patient was extubated and sent to the recovery room.  I am about to discuss the operative findings with the patient's husband. I suspect the patient is going to be in the hospital at least overnight and will need antibiotics for 5 days. Questions answered. They expressed understanding and appreciation.

## 2012-04-01 NOTE — ED Notes (Signed)
Surgeon at bedside.  

## 2012-04-01 NOTE — Anesthesia Postprocedure Evaluation (Signed)
Anesthesia Post Note  Patient: [name] [name]  Procedure(s) Performed: Procedure(s) (LRB): APPENDECTOMY LAPAROSCOPIC (N/A)  Anesthesia type: General  Patient location: PACU  Post pain: Pain level controlled  Post assessment: Post-op Vital signs reviewed  Last Vitals:  Filed Vitals:   04/01/12 0915  BP: 64/44  Pulse: 78  Temp: 36.9 C  Resp: 13    Post vital signs: Reviewed  Level of consciousness: sedated  Complications: No apparent anesthesia complications

## 2012-04-02 ENCOUNTER — Encounter (HOSPITAL_COMMUNITY): Payer: Self-pay | Admitting: Surgery

## 2012-04-02 NOTE — Discharge Summary (Signed)
GOING HOME DOING WELL

## 2012-04-02 NOTE — Progress Notes (Signed)
DOING WELL.

## 2012-04-06 ENCOUNTER — Encounter (HOSPITAL_COMMUNITY): Payer: Self-pay | Admitting: *Deleted

## 2012-04-06 ENCOUNTER — Emergency Department (HOSPITAL_COMMUNITY)
Admission: EM | Admit: 2012-04-06 | Discharge: 2012-04-06 | Disposition: A | Discharge status: Home / Self Care | Payer: 59 | Attending: Emergency Medicine | Admitting: Emergency Medicine

## 2012-04-06 ENCOUNTER — Ambulatory Visit (INDEPENDENT_AMBULATORY_CARE_PROVIDER_SITE_OTHER): Payer: Commercial Managed Care - PPO | Admitting: Family Medicine

## 2012-04-06 ENCOUNTER — Ambulatory Visit: Payer: Commercial Managed Care - PPO

## 2012-04-06 ENCOUNTER — Emergency Department (HOSPITAL_COMMUNITY): Payer: 59

## 2012-04-06 VITALS — BP 124/87 | HR 119 | Temp 100.4°F | Resp 18 | Ht 60.0 in | Wt 169.4 lb

## 2012-04-06 DIAGNOSIS — R1110 Vomiting, unspecified: Secondary | ICD-10-CM | POA: Insufficient documentation

## 2012-04-06 DIAGNOSIS — K5289 Other specified noninfective gastroenteritis and colitis: Secondary | ICD-10-CM | POA: Insufficient documentation

## 2012-04-06 DIAGNOSIS — Z8679 Personal history of other diseases of the circulatory system: Secondary | ICD-10-CM | POA: Insufficient documentation

## 2012-04-06 DIAGNOSIS — Z9089 Acquired absence of other organs: Secondary | ICD-10-CM | POA: Insufficient documentation

## 2012-04-06 DIAGNOSIS — R197 Diarrhea, unspecified: Secondary | ICD-10-CM | POA: Insufficient documentation

## 2012-04-06 DIAGNOSIS — R112 Nausea with vomiting, unspecified: Secondary | ICD-10-CM

## 2012-04-06 DIAGNOSIS — R51 Headache: Secondary | ICD-10-CM | POA: Insufficient documentation

## 2012-04-06 DIAGNOSIS — R1031 Right lower quadrant pain: Secondary | ICD-10-CM | POA: Insufficient documentation

## 2012-04-06 DIAGNOSIS — R5082 Postprocedural fever: Secondary | ICD-10-CM | POA: Insufficient documentation

## 2012-04-06 DIAGNOSIS — K529 Noninfective gastroenteritis and colitis, unspecified: Secondary | ICD-10-CM

## 2012-04-06 DIAGNOSIS — Z79899 Other long term (current) drug therapy: Secondary | ICD-10-CM | POA: Insufficient documentation

## 2012-04-06 DIAGNOSIS — Z8619 Personal history of other infectious and parasitic diseases: Secondary | ICD-10-CM | POA: Insufficient documentation

## 2012-04-06 DIAGNOSIS — R109 Unspecified abdominal pain: Secondary | ICD-10-CM

## 2012-04-06 DIAGNOSIS — Z8659 Personal history of other mental and behavioral disorders: Secondary | ICD-10-CM | POA: Insufficient documentation

## 2012-04-06 LAB — COMPREHENSIVE METABOLIC PANEL
BUN: 8 mg/dL (ref 6–23)
CO2: 26 mEq/L (ref 19–32)
Calcium: 8.9 mg/dL (ref 8.4–10.5)
Chloride: 102 mEq/L (ref 96–112)
Creat: 0.73 mg/dL (ref 0.50–1.10)
Glucose, Bld: 99 mg/dL (ref 70–99)

## 2012-04-06 LAB — POCT URINALYSIS DIPSTICK
Bilirubin, UA: NEGATIVE
Leukocytes, UA: NEGATIVE
Spec Grav, UA: 1.02

## 2012-04-06 LAB — POCT CBC
Granulocyte percent: 85.2 %G — AB (ref 37–80)
HCT, POC: 40.2 % (ref 37.7–47.9)
MCH, POC: 26.2 pg — AB (ref 27–31.2)
MCV: 84.3 fL (ref 80–97)
POC LYMPH PERCENT: 10.3 %L (ref 10–50)
RDW, POC: 16.4 %
WBC: 11.2 10*3/uL — AB (ref 4.6–10.2)

## 2012-04-06 LAB — POCT UA - MICROSCOPIC ONLY: Mucus, UA: NEGATIVE

## 2012-04-06 MED ORDER — FENTANYL CITRATE 0.05 MG/ML IJ SOLN
50.0000 ug | Freq: Once | INTRAMUSCULAR | Status: AC
  Administered 2012-04-06: 50 ug via INTRAVENOUS
  Filled 2012-04-06: qty 2

## 2012-04-06 MED ORDER — ONDANSETRON HCL 4 MG PO TABS: 4.0000 mg | ORAL_TABLET | Freq: Three times a day (TID) | ORAL | Status: DC | PRN

## 2012-04-06 MED ORDER — SODIUM CHLORIDE 0.9 % IV BOLUS (SEPSIS)
1000.0000 mL | Freq: Once | INTRAVENOUS | Status: AC
  Administered 2012-04-06: 1000 mL via INTRAVENOUS

## 2012-04-06 MED ORDER — IOHEXOL 300 MG/ML  SOLN
100.0000 mL | Freq: Once | INTRAMUSCULAR | Status: AC | PRN
  Administered 2012-04-06: 100 mL via INTRAVENOUS

## 2012-04-06 MED ORDER — ONDANSETRON HCL 4 MG/2ML IJ SOLN
8.0000 mg | Freq: Once | INTRAMUSCULAR | Status: AC
  Administered 2012-04-06: 8 mg via INTRAMUSCULAR

## 2012-04-06 MED ORDER — IOHEXOL 300 MG/ML  SOLN
50.0000 mL | Freq: Once | INTRAMUSCULAR | Status: AC | PRN
  Administered 2012-04-06: 50 mL via ORAL

## 2012-04-06 MED ORDER — IOHEXOL 350 MG/ML SOLN: 100.0000 mL | Freq: Once | INTRAVENOUS | Status: DC | PRN

## 2012-04-06 NOTE — ED Provider Notes (Signed)
History     CSN: [id number]  Arrival date & time 04/06/12  1519   First MD Initiated Contact with Patient 04/06/12 1623      Chief Complaint  Patient presents with  . Post-op Problem    (Consider location/radiation/quality/duration/timing/severity/associated sxs/prior treatment) HPI Pt is approximately 5 days s/p uncomplicated laparoscopic appendectomy. She reports she was doing well post-op until today when she began to have vomiting, increased lower abdominal pain and fever. She was seen at [facility] where she had negative CXR and UA. CBC was done as well which showed improved WBC. Advised by Dr. Lindie Spruce to return to the [facility] for repeat CT to rule out abscess formation. She has also had moderate diffuse headache.   Past Medical History  Diagnosis Date  . Migraines     MIGRAINES;HAS TAKEN MAXALT AND VICODIN IN THE PAST OR CAFFEINE  . Depression     HAS TAKEN LEXAPRO 40MG  DAILY BEFORE PREGNANCY;WEANED SELF OFF  . Infection     YEAST INF;TYPICALLY AFTER ANTIBXS;NOT FREQ  . Abnormal Pap smear 2005    PRE-CANCEROUS CELLS ON CX;COLPO DONE AND CELLS "BURNED OFF";LAST PAP 2010 OR 2011,WAS NORMAL    Past Surgical History  Procedure Laterality Date  . Wisdom tooth extraction  1998    ALL 4 EXTRACTED  . Colposcopy    . Cryotherapy    . Cesarean section  01/28/2012    Procedure: CESAREAN SECTION;  Surgeon: Esmeralda Arthur, MD;  Location: [facility];  Service: Obstetrics;  Laterality: N/A;  . Laparoscopic appendectomy N/A 04/01/2012    Procedure: APPENDECTOMY LAPAROSCOPIC;  Surgeon: Ardeth Sportsman, MD;  Location: [facility];  Service: General;  Laterality: N/A;    Family History  Problem Relation Age of Onset  . Migraines Father     IN THE PAST  . Migraines Paternal Grandfather   . Thyroid disease Mother   . Alzheimer's disease Father     DX'D @ 48    History  Substance Use Topics  . Smoking status: Never Smoker   . Smokeless tobacco: Never Used  . Alcohol Use: No     Comment: RARELY     OB History   Grav Para Term Preterm Abortions TAB SAB Ect Mult Living   1 1 1  0 0 0 0 0 0 1      Review of Systems All other systems reviewed and are negative except as noted in HPI.   Allergies  Review of patient's allergies indicates no known allergies.  Home Medications   Current Outpatient Rx  Name  Route  Sig  Dispense  Refill  . amoxicillin-clavulanate (AUGMENTIN) 875-125 MG per tablet   Oral   Take 1 tablet by mouth every 12 (twelve) hours.         Marland Kitchen esomeprazole (NEXIUM) 40 MG capsule   Oral   Take 40 mg by mouth daily before breakfast.         . ibuprofen (ADVIL,MOTRIN) 200 MG tablet   Oral   Take 600 mg by mouth every 6 (six) hours as needed for pain.         . Multiple Vitamins-Minerals (MULTIVITAMIN WITH MINERALS) tablet   Oral   Take 1 tablet by mouth daily.         Marland Kitchen oxyCODONE-acetaminophen (PERCOCET/ROXICET) 5-325 MG per tablet      Do not use this if your using your previously prescribed dilaudid.   40 tablet   0   . levonorgestrel (MIRENA) 20 MCG/24HR IUD  Intrauterine   1 each by Intrauterine route once.           BP 120/92  Pulse 124  Temp(Src) 99.2 F (37.3 C) (Oral)  Resp 20  SpO2 100%  LMP 03/06/2012  Physical Exam  Nursing note and vitals reviewed. Constitutional: She is oriented to person, place, and time. She appears well-developed and well-nourished.  HENT:  Head: Normocephalic and atraumatic.  Eyes: EOM are normal. Pupils are equal, round, and reactive to light.  Neck: Normal range of motion. Neck supple.  Cardiovascular: Normal rate, normal heart sounds and intact distal pulses.   Pulmonary/Chest: Effort normal and breath sounds normal.  Abdominal: Bowel sounds are normal. She exhibits no distension. There is tenderness (RLQ). There is guarding. There is no rebound.  Laparoscopy incisions healing without signs of infection  Musculoskeletal: Normal range of motion. She exhibits no edema and no tenderness.   Neurological: She is alert and oriented to person, place, and time. She has normal strength. No cranial nerve deficit or sensory deficit.  Skin: Skin is warm and dry. No rash noted.  Psychiatric: She has a normal mood and affect.    ED Course  Procedures (including critical care time)  Labs Reviewed - No data to display Dg Chest 2 View  04/06/2012  *RADIOLOGY REPORT*  Clinical Data: Postoperative fever, vomiting  CHEST - 2 VIEW  Comparison: None.  Findings: The lungs are well-aerated and free from pulmonary edema, focal airspace consolidation or pulmonary nodule.  Cardiac and mediastinal contours are within normal limits.  No pneumothorax, or pleural effusion. No acute osseous findings.  IMPRESSION:  No acute cardiopulmonary disease.   Original Report Authenticated By: Malachy Moan, M.D.    Ct Abdomen Pelvis W Contrast  04/06/2012  *RADIOLOGY REPORT*  Clinical Data: Postoperative fever.  Appendectomy 5 days ago.  CT ABDOMEN AND PELVIS WITH CONTRAST  Technique:  Multidetector CT imaging of the abdomen and pelvis was performed following the standard protocol during bolus administration of intravenous contrast.  Contrast: OMNIPAQUE IOHEXOL 300 MG/ML  SOLN  Comparison: 04/01/2012  Findings: Linear subsegmental atelectasis in the lung bases.  No effusions.  Heart is normal size.  Liver, gallbladder, spleen, stomach, pancreas, adrenals and kidneys are normal.  Changes of appendectomy in the right lower quadrant.  Inflammatory stranding and free fluid noted in the right lower quadrant without well-defined abscess.  Free fluid noted in the cul-de-sac.  Mild right lower quadrant mesenteric lymph nodes.  While the colon is decompressed, there appears to be mild to moderate wall thickening in the ascending colon and transverse colon suggesting the possibility of colitis.  Clinical correlation. Small bowel is decompressed.  No evidence of bowel obstruction.  IUD is present in the uterus.  Small  follicles in the ovaries bilaterally.  No adnexal masses.  No free air or adenopathy.  Small scattered mesenteric lymph nodes in the right lower quadrant.  IMPRESSION: Postoperative changes from recent appendectomy.  Stranding and fluid in the right lower quadrant without well-defined focal abscess.  Small to moderate free fluid in the cul-de-sac.  Colon is decompressed, but there is suggestion of wall thickening in the ascending colon and transverse colon suggesting colitis.   Original Report Authenticated By: Charlett Nose, M.D.      No diagnosis found.    MDM  CT as above does not show abscess or free air although there is some inflammatory changes to ascending colon. Pt did not initially have any diarrhea but reports during her ED  stay she has begun to have watery stool. Discussed with Dr. Johna Sheriff. Do not feel the patient needs admission but recommends close outpatient followup. Will hold Augmentin for now. C diff ordered but will need to collect sample at home. Advised to call [facility] office tomorrow for follow up.         Charles B. Bernette Mayers, MD 04/06/12 1610

## 2012-04-06 NOTE — ED Notes (Addendum)
Pt in from urgent care, sent for further evaluation of post op fever, pt had appendectomy on Tuesday, pt c/o fever and nausea since discharge, sent here per MD [facility] for follow up CT scan. Pt given tylenol at urgent care.
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
## 2012-04-06 NOTE — ED Notes (Signed)
Pt and family verbalized understanding of discharge instructions °

## 2012-04-06 NOTE — Patient Instructions (Addendum)
Postprocedural fever - Plan: POCT CBC, POCT glucose (manual entry), POCT urinalysis dipstick, POCT UA - Microscopic Only, Comprehensive metabolic panel, Urine culture, DG Chest 2 View  Nausea with vomiting - Plan: ondansetron (ZOFRAN) injection 8 mg  Abdominal pain, unspecified site     PLEASE PRESENT TO [facility] EMERGENCY ROOM FOR FURTHER EVALUATION.

## 2012-04-07 ENCOUNTER — Encounter (INDEPENDENT_AMBULATORY_CARE_PROVIDER_SITE_OTHER): Payer: Self-pay | Admitting: Surgery

## 2012-04-07 ENCOUNTER — Ambulatory Visit (INDEPENDENT_AMBULATORY_CARE_PROVIDER_SITE_OTHER): Payer: Commercial Managed Care - PPO | Admitting: Surgery

## 2012-04-07 VITALS — BP 116/66 | HR 65 | Temp 96.8°F | Resp 12 | Ht 60.0 in | Wt 168.2 lb

## 2012-04-07 DIAGNOSIS — K3580 Unspecified acute appendicitis: Secondary | ICD-10-CM

## 2012-04-07 DIAGNOSIS — R5082 Postprocedural fever: Secondary | ICD-10-CM | POA: Insufficient documentation

## 2012-04-07 LAB — URINE CULTURE
Colony Count: NO GROWTH
Organism ID, Bacteria: NO GROWTH
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
## 2012-04-07 NOTE — Patient Instructions (Signed)
GETTING TO GOOD BOWEL HEALTH. Irregular bowel habits such as constipation and diarrhea can lead to many problems over time.  Having one soft bowel movement a day is the most important way to prevent further problems.  The anorectal canal is designed to handle stretching and feces to safely manage our ability to get rid of solid waste (feces, poop, stool) out of our body.  BUT, hard constipated stools can act like ripping concrete bricks and diarrhea can be a burning fire to this very sensitive area of our body, causing inflamed hemorrhoids, anal fissures, increasing risk is perirectal abscesses, abdominal pain/bloating, an making irritable bowel worse.     The goal: ONE SOFT BOWEL MOVEMENT A DAY!  To have soft, regular bowel movements:    Drink at least 8 tall glasses of water a day.     Take plenty of fiber.  Fiber is the undigested part of plant food that passes into the colon, acting s "natures broom" to encourage bowel motility and movement.  Fiber can absorb and hold large amounts of water. This results in a larger, bulkier stool, which is soft and easier to pass. Work gradually over several weeks up to 6 servings a day of fiber (25g a day even more if needed) in the form of: o Vegetables -- Root (potatoes, carrots, turnips), leafy green (lettuce, salad greens, celery, spinach), or cooked high residue (cabbage, broccoli, etc) o Fruit -- Fresh (unpeeled skin & pulp), Dried (prunes, apricots, cherries, etc ),  or stewed ( applesauce)  o Whole grain breads, pasta, etc (whole wheat)  o Bran cereals    Bulking Agents -- This type of water-retaining fiber generally is easily obtained each day by one of the following:  o Psyllium bran -- The psyllium plant is remarkable because its ground seeds can retain so much water. This product is available as Metamucil, Konsyl, Effersyllium, Per Diem Fiber, or the less expensive generic preparation in drug and health food stores. Although labeled a laxative, it really  is not a laxative.  o Methylcellulose -- This is another fiber derived from wood which also retains water. It is available as Citrucel. o Polyethylene Glycol - and "artificial" fiber commonly called Miralax or Glycolax.  It is helpful for people with gassy or bloated feelings with regular fiber o Flax Seed - a less gassy fiber than psyllium   No reading or other relaxing activity while on the toilet. If bowel movements take longer than 5 minutes, you are too constipated   AVOID CONSTIPATION.  High fiber and water intake usually takes care of this.  Sometimes a laxative is needed to stimulate more frequent bowel movements, but    Laxatives are not a good long-term solution as it can wear the colon out. o Osmotics (Milk of Magnesia, Fleets phosphosoda, Magnesium citrate, MiraLax, GoLytely) are safer than  o Stimulants (Senokot, Castor Oil, Dulcolax, Ex Lax)    o Do not take laxatives for more than 7days in a row.    IF SEVERELY CONSTIPATED, try a Bowel Retraining Program: o Do not use laxatives.  o Eat a diet high in roughage, such as bran cereals and leafy vegetables.  o Drink six (6) ounces of prune or apricot juice each morning.  o Eat two (2) large servings of stewed fruit each day.  o Take one (1) heaping tablespoon of a psyllium-based bulking agent twice a day. Use sugar-free sweetener when possible to avoid excessive calories.  o Eat a normal breakfast.  o   Set aside 15 minutes after breakfast to sit on the toilet, but do not strain to have a bowel movement.  o If you do not have a bowel movement by the third day, use an enema and repeat the above steps.    Controlling diarrhea o Switch to liquids and simpler foods for a few days to avoid stressing your intestines further. o Avoid dairy products (especially milk & ice cream) for a short time.  The intestines often can lose the ability to digest lactose when stressed. o Avoid foods that cause gassiness or bloating.  Typical foods include  beans and other legumes, cabbage, broccoli, and dairy foods.  Every person has some sensitivity to other foods, so listen to our body and avoid those foods that trigger problems for you. o Adding fiber (Citrucel, Metamucil, psyllium, Miralax) gradually can help thicken stools by absorbing excess fluid and retrain the intestines to act more normally.  Slowly increase the dose over a few weeks.  Too much fiber too soon can backfire and cause cramping & bloating. o Probiotics (such as active yogurt, Align, etc) may help repopulate the intestines and colon with normal bacteria and calm down a sensitive digestive tract.  Most studies show it to be of mild help, though, and such products can be costly. o Medicines:   Bismuth subsalicylate (ex. Kayopectate, Pepto Bismol) every 30 minutes for up to 6 doses can help control diarrhea.  Avoid if pregnant.   Loperamide (Immodium) can slow down diarrhea.  Start with two tablets (4mg  total) first and then try one tablet every 6 hours.  Avoid if you are having fevers or severe pain.     If you are not better or start feeling worse, stop all medicines and call your doctor for advice o Call your doctor if you are getting worse or not better.  Sometimes further testing (cultures, endoscopy, X-ray studies, bloodwork, etc) may be needed to help diagnose and treat the cause of the diarrhea.  Diarrhea Infections caused by germs (bacterial) or a virus commonly cause diarrhea. Your caregiver has determined that with time, rest and fluids, the diarrhea should improve. In general, eat normally while drinking more water than usual. Although water may prevent dehydration, it does not contain salt and minerals (electrolytes). Broths, weak tea without caffeine and oral rehydration solutions (ORS) replace fluids and electrolytes. Small amounts of fluids should be taken frequently. Large amounts at one time may not be tolerated. Plain water may be harmful in infants and the elderly.  Oral rehydrating solutions (ORS) are available at pharmacies and grocery stores. ORS replace water and important electrolytes in proper proportions. Sports drinks are not as effective as ORS and may be harmful due to sugars worsening diarrhea.  ORS is especially recommended for use in children with diarrhea. As a general guideline for children, replace any new fluid losses from diarrhea and/or vomiting with ORS as follows:  If your child weighs 22 pounds or under (10 kg or less), give 60-120 mL ( -  cup or 2 - 4 ounces) of ORS for each episode of diarrheal stool or vomiting episode.  If your child weighs more than 22 pounds (more than 10 kgs), give 120-240 mL ( - 1 cup or 4 - 8 ounces) of ORS for each diarrheal stool or episode of vomiting.  While correcting for dehydration, children should eat normally. However, foods high in sugar should be avoided because this may worsen diarrhea. Large amounts of carbonated soft drinks, juice, gelatin  desserts and other highly sugared drinks should be avoided.  After correction of dehydration, other liquids that are appealing to the child may be added. Children should drink small amounts of fluids frequently and fluids should be increased as tolerated. Children should drink enough fluids to keep urine clear or pale yellow.  Adults should eat normally while drinking more fluids than usual. Drink small amounts of fluids frequently and increase as tolerated. Drink enough fluids to keep urine clear or pale yellow. Broths, weak decaffeinated tea, lemon lime soft drinks (allowed to go flat) and ORS replace fluids and electrolytes.  Avoid:  Carbonated drinks.  Juice.  Extremely hot or cold fluids.  Caffeine drinks.  Fatty, greasy foods.  Alcohol.  Tobacco.  Too much intake of anything at one time.  Gelatin desserts.  Probiotics are active cultures of beneficial bacteria. They may lessen the amount and number of diarrheal stools in adults. Probiotics  can be found in yogurt with active cultures and in supplements.  Wash hands well to avoid spreading bacteria and virus.  Anti-diarrheal medications are not recommended for infants and children.  Only take over-the-counter or prescription medicines for pain, discomfort or fever as directed by your caregiver. Do not give aspirin to children because it may cause Reye's Syndrome.  For adults, ask your caregiver if you should continue all prescribed and over-the-counter medicines.  If your caregiver has given you a follow-up appointment, it is very important to keep that appointment. Not keeping the appointment could result in a chronic or permanent injury, and disability. If there is any problem keeping the appointment, you must call back to this facility for assistance. SEEK IMMEDIATE MEDICAL CARE IF:   You or your child is unable to keep fluids down or other symptoms or problems become worse in spite of treatment.  Vomiting or diarrhea develops and becomes persistent.  There is vomiting of blood or bile (green material).  There is blood in the stool or the stools are black and tarry.  There is no urine output in 6-8 hours or there is only a small amount of very dark urine.  Abdominal pain develops, increases or localizes.  You have a fever.  Your baby is older than 3 months with a rectal temperature of 102 F (38.9 C) or higher.  Your baby is 33 months old or younger with a rectal temperature of 100.4 F (38 C) or higher.  You or your child develops excessive weakness, dizziness, fainting or extreme thirst.  You or your child develops a rash, stiff neck, severe headache or become irritable or sleepy and difficult to awaken. MAKE SURE YOU:   Understand these instructions.  Will watch your condition.  Will get help right away if you are not doing well or get worse. Document Released: 01/26/2002 Document Revised: 04/30/2011 Document Reviewed: 12/13/2008 [facility] Patient  Information 2013 [place], [place].

## 2012-04-08 ENCOUNTER — Other Ambulatory Visit: Payer: Self-pay

## 2012-04-08 ENCOUNTER — Telehealth: Payer: Self-pay

## 2012-04-08 DIAGNOSIS — T8332XA Displacement of intrauterine contraceptive device, initial encounter: Secondary | ICD-10-CM

## 2012-04-08 NOTE — Telephone Encounter (Signed)
Message copied by Darien Ramus on Tue Apr 08, 2012 10:34 AM ------      Message from: Silverio Lay      Created: Sat Apr 05, 2012  1:29 PM      Regarding: need RGYN appt ASAP       Please schedule this pt with me ASAP with transvaginal ultrasound for malpositionned IUD ------

## 2012-04-08 NOTE — Telephone Encounter (Signed)
Apt scheduled for 04/15/12, 11:30 u/s with 11:50 apt with SR  Darien Ramus, CMA

## 2012-04-10 ENCOUNTER — Telehealth: Payer: Self-pay

## 2012-04-10 NOTE — Telephone Encounter (Signed)
Spoke with pt, advised that SR really wanted her to be seen. Had concerns with perforation. Asked pt if she could possibly come in tomorrow. Pt to call back and let me know if she will be able to do that apt.  Darien Ramus, CMA

## 2012-04-11 ENCOUNTER — Other Ambulatory Visit: Payer: Commercial Managed Care - PPO

## 2012-04-11 ENCOUNTER — Ambulatory Visit: Payer: Commercial Managed Care - PPO | Admitting: Obstetrics and Gynecology

## 2012-04-11 ENCOUNTER — Encounter: Payer: Self-pay | Admitting: Obstetrics and Gynecology

## 2012-04-11 ENCOUNTER — Other Ambulatory Visit: Payer: Self-pay

## 2012-04-11 VITALS — BP 94/70 | Temp 98.3°F | Wt 169.0 lb

## 2012-04-11 DIAGNOSIS — T8332XA Displacement of intrauterine contraceptive device, initial encounter: Secondary | ICD-10-CM

## 2012-04-11 NOTE — Telephone Encounter (Signed)
Pt being seen in office today by EP   Darien Ramus, CMA

## 2012-04-11 NOTE — Progress Notes (Signed)
32 YO with a Mirena IUD that during imaging in evaluation for appendicitis (April 06, 2012)  was found to have a malpositioned IUD.  Presents today for further evaluation of IUD position using pelvic ultrasound. Patient has no symptoms related to her IUD.  Underwent appendectomy  O: U/S: uterus-5.55 x 4.77 x 3.57 cm,  IUD is malpositioned, T bars are not in the uterine fundus, the IUD is lower in position and angled with the right T bar lower than the left-confirmed by 3D imaging. Normal appearing ovaries  Pelvic: Abdomen: soft, non-tender without masses             EGBUS-wnl, vagina-normal, scant blood, cervix- strings visible, no lesions and without tenderness, uterus-normal size and adnexae-no tenderness or masses   A: IUD Malposition-asymptomatic  P: Patient given the option of having the IUD removed and/or replaced-desires to keep it       RTO-as scheduled or prn  Cherry Wittwer, PA-C

## 2012-04-14 ENCOUNTER — Encounter (INDEPENDENT_AMBULATORY_CARE_PROVIDER_SITE_OTHER): Payer: Commercial Managed Care - PPO | Admitting: Surgery

## 2012-04-15 ENCOUNTER — Encounter: Payer: Commercial Managed Care - PPO | Admitting: Obstetrics and Gynecology

## 2012-04-15 ENCOUNTER — Other Ambulatory Visit: Payer: Commercial Managed Care - PPO

## 2012-04-16 NOTE — Telephone Encounter (Signed)
Pt seen 2/21 by EP regarding IUD & U/S.

## 2012-04-21 ENCOUNTER — Encounter (INDEPENDENT_AMBULATORY_CARE_PROVIDER_SITE_OTHER): Payer: Self-pay

## 2012-04-21 ENCOUNTER — Encounter (INDEPENDENT_AMBULATORY_CARE_PROVIDER_SITE_OTHER): Payer: Self-pay | Admitting: Surgery

## 2012-04-21 ENCOUNTER — Ambulatory Visit (INDEPENDENT_AMBULATORY_CARE_PROVIDER_SITE_OTHER): Payer: Commercial Managed Care - PPO | Admitting: Surgery

## 2012-04-21 VITALS — BP 110/66 | HR 72 | Resp 18 | Ht 59.5 in | Wt 166.0 lb

## 2012-04-21 DIAGNOSIS — K3580 Unspecified acute appendicitis: Secondary | ICD-10-CM
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
## 2012-04-21 NOTE — Patient Instructions (Signed)
Managing Pain  Pain after surgery or related to activity is often due to strain/injury to muscle, tendon, nerves and/or incisions.  This pain is usually short-term and will improve in a few months.   Many people find it helpful to do the following things TOGETHER to help speed the process of healing and to get back to regular activity more quickly:  1. Avoid heavy physical activity a.  no lifting greater than 20 pounds b. Do not "push through" the pain.  Listen to your body and avoid positions and maneuvers than reproduce the pain c. Walking is okay as tolerated, but go slowly and stop when getting sore.  d. Remember: If it hurts to do it, then don't do it! 2. Take Anti-inflammatory medication  a. Take with food/snack around the clock for 1-2 weeks i. This helps the muscle and nerve tissues become less irritable and calm down faster b. Choose ONE of the following over-the-counter medications: i. Naproxen 220mg tabs (ex. Aleve) 1-2 pills twice a day  ii. Ibuprofen 200mg tabs (ex. Advil, Motrin) 3-4 pills with every meal and just before bedtime iii. Acetaminophen 500mg tabs (Tylenol) 1-2 pills with every meal and just before bedtime 3. Use a Heating pad or Ice/Cold Pack a. 4-6 times a day b. May use warm bath/hottub  or showers 4. Try Gentle Massage and/or Stretching  a. at the area of pain many times a day b. stop if you feel pain - do not overdo it  Try these steps together to help you body heal faster and avoid making things get worse.  Doing just one of these things may not be enough.    If you are not getting better after two weeks or are noticing you are getting worse, contact our office for further advice; we may need to re-evaluate you & see what other things we can do to help.  GETTING TO GOOD BOWEL HEALTH. Irregular bowel habits such as constipation and diarrhea can lead to many problems over time.  Having one soft bowel movement a day is the most important way to prevent  further problems.  The anorectal canal is designed to handle stretching and feces to safely manage our ability to get rid of solid waste (feces, poop, stool) out of our body.  BUT, hard constipated stools can act like ripping concrete bricks and diarrhea can be a burning fire to this very sensitive area of our body, causing inflamed hemorrhoids, anal fissures, increasing risk is perirectal abscesses, abdominal pain/bloating, an making irritable bowel worse.     The goal: ONE SOFT BOWEL MOVEMENT A DAY!  To have soft, regular bowel movements:    Drink at least 8 tall glasses of water a day.     Take plenty of fiber.  Fiber is the undigested part of plant food that passes into the colon, acting s "natures broom" to encourage bowel motility and movement.  Fiber can absorb and hold large amounts of water. This results in a larger, bulkier stool, which is soft and easier to pass. Work gradually over several weeks up to 6 servings a day of fiber (25g a day even more if needed) in the form of: o Vegetables -- Root (potatoes, carrots, turnips), leafy green (lettuce, salad greens, celery, spinach), or cooked high residue (cabbage, broccoli, etc) o Fruit -- Fresh (unpeeled skin & pulp), Dried (prunes, apricots, cherries, etc ),  or stewed ( applesauce)  o Whole grain breads, pasta, etc (whole wheat)  o Bran cereals      Bulking Agents -- This type of water-retaining fiber generally is easily obtained each day by one of the following:  o Psyllium bran -- The psyllium plant is remarkable because its ground seeds can retain so much water. This product is available as Metamucil, Konsyl, Effersyllium, Per Diem Fiber, or the less expensive generic preparation in drug and health food stores. Although labeled a laxative, it really is not a laxative.  o Methylcellulose -- This is another fiber derived from wood which also retains water. It is available as Citrucel. o Polyethylene Glycol - and "artificial" fiber commonly called  Miralax or Glycolax.  It is helpful for people with gassy or bloated feelings with regular fiber o Flax Seed - a less gassy fiber than psyllium   No reading or other relaxing activity while on the toilet. If bowel movements take longer than 5 minutes, you are too constipated   AVOID CONSTIPATION.  High fiber and water intake usually takes care of this.  Sometimes a laxative is needed to stimulate more frequent bowel movements, but    Laxatives are not a good long-term solution as it can wear the colon out. o Osmotics (Milk of Magnesia, Fleets phosphosoda, Magnesium citrate, MiraLax, GoLytely) are safer than  o Stimulants (Senokot, Castor Oil, Dulcolax, Ex Lax)    o Do not take laxatives for more than 7days in a row.    IF SEVERELY CONSTIPATED, try a Bowel Retraining Program: o Do not use laxatives.  o Eat a diet high in roughage, such as bran cereals and leafy vegetables.  o Drink six (6) ounces of prune or apricot juice each morning.  o Eat two (2) large servings of stewed fruit each day.  o Take one (1) heaping tablespoon of a psyllium-based bulking agent twice a day. Use sugar-free sweetener when possible to avoid excessive calories.  o Eat a normal breakfast.  o Set aside 15 minutes after breakfast to sit on the toilet, but do not strain to have a bowel movement.  o If you do not have a bowel movement by the third day, use an enema and repeat the above steps.    Controlling diarrhea o Switch to liquids and simpler foods for a few days to avoid stressing your intestines further. o Avoid dairy products (especially milk & ice cream) for a short time.  The intestines often can lose the ability to digest lactose when stressed. o Avoid foods that cause gassiness or bloating.  Typical foods include beans and other legumes, cabbage, broccoli, and dairy foods.  Every person has some sensitivity to other foods, so listen to our body and avoid those foods that trigger problems for you. o Adding fiber  (Citrucel, Metamucil, psyllium, Miralax) gradually can help thicken stools by absorbing excess fluid and retrain the intestines to act more normally.  Slowly increase the dose over a few weeks.  Too much fiber too soon can backfire and cause cramping & bloating. o Probiotics (such as active yogurt, Align, etc) may help repopulate the intestines and colon with normal bacteria and calm down a sensitive digestive tract.  Most studies show it to be of mild help, though, and such products can be costly. o Medicines:   Bismuth subsalicylate (ex. Kayopectate, Pepto Bismol) every 30 minutes for up to 6 doses can help control diarrhea.  Avoid if pregnant.   Loperamide (Immodium) can slow down diarrhea.  Start with two tablets (4mg total) first and then try one tablet every 6 hours.  Avoid if you   are having fevers or severe pain.  If you are not better or start feeling worse, stop all medicines and call your doctor for advice o Call your doctor if you are getting worse or not better.  Sometimes further testing (cultures, endoscopy, X-ray studies, bloodwork, etc) may be needed to help diagnose and treat the cause of the diarrhea. o  

## 2012-05-07 ENCOUNTER — Ambulatory Visit (INDEPENDENT_AMBULATORY_CARE_PROVIDER_SITE_OTHER): Payer: Commercial Managed Care - PPO | Admitting: Physician Assistant

## 2012-05-07 VITALS — BP 108/73 | HR 101 | Temp 98.3°F | Resp 16 | Ht 60.0 in | Wt 163.0 lb

## 2012-05-07 DIAGNOSIS — O99345 Other mental disorders complicating the puerperium: Secondary | ICD-10-CM

## 2012-05-07 DIAGNOSIS — F53 Postpartum depression: Secondary | ICD-10-CM

## 2012-05-07 MED ORDER — SERTRALINE HCL 50 MG PO TABS: 50.0000 mg | ORAL_TABLET | Freq: Every day | ORAL | Status: DC

## 2012-05-07 NOTE — Progress Notes (Signed)
   [address] [place] [address]   Phone [phone]  Subjective:    Patient ID: [name] [name], female    DOB: 11/01/1980, 32 y.o.   MRN: [id number]  HPI  Pt presents to clinic with concerns about post-partum depression.  She is about 3 months post-partum and has been back to work for about 2 weeks.  She has noticed in the last 2 weeks she is tearful sometimes at nothing and she is more irritable then she has been.  Her daughter is not sleeping well at night since she has returned to work.  Her daughter is having spells where she cry for hours - She is feeling overwhelmed.  She has a good support system with her fiance but he just thinks she needs to get over this, mind over matter.  She has h/o depression in the past but had to go off her medications when she found out she was pregnant.  She was hoping that she would be ok but she is not.  She has no thoughts of hurting her baby but is overwhelmed and not really getting a break.  She is wanting to lose weight but is stress eating.  She is upset that she had to stop breastfeeding due to exhaustion and baby not latching correctly.  Review of Systems  Psychiatric/Behavioral: Positive for sleep disturbance (2nd to child) and dysphoric mood. Negative for suicidal ideas and self-injury. The patient is nervous/anxious.        Objective:   Physical Exam  Vitals reviewed. Constitutional: She is oriented to person, place, and time. She appears well-developed and well-nourished.  HENT:  Head: Normocephalic and atraumatic.  Right Ear: External ear normal.  Left Ear: External ear normal.  Pulmonary/Chest: Effort normal.  Neurological: She is alert and oriented to person, place, and time.  Skin: Skin is warm and dry.  Psychiatric: She has a normal mood and affect. Her behavior is normal. Judgment and thought content normal.  Tearful in room.       Assessment & Plan:  Depression, postpartum - Pt is at high risk for post-partum depression due  to being on medications when she found out she was pregnant.  She was on a high dose of Lexapro (40mg  qd) when she stopped.  We talked about coping mechanisms for sleep and stress.  D/w pt that it is ok to put baby down when she is crying and she feels overwhelmed.  She should ask for help from family.  Think about a support group of new moms to talk about her feelings.  Plan: Starts sertraline (ZOLOFT) 50 MG tablet, Pt to continue her Ativan prn and let me know is she needs more.  Recheck in 2 wks.  D/w pt that some of the feelings are normal and if she should ever feel like she is going to put the baby in harms way she will call for help.  She understands and agrees with the above plan.    I spent 45 mins with the patient in evaluation and counseling.

## 2012-05-12 ENCOUNTER — Encounter: Payer: Self-pay | Admitting: Emergency Medicine

## 2012-05-12 ENCOUNTER — Emergency Department (INDEPENDENT_AMBULATORY_CARE_PROVIDER_SITE_OTHER)
Admission: EM | Admit: 2012-05-12 | Discharge: 2012-05-12 | Disposition: A | Discharge status: Home / Self Care | Payer: 59 | Source: Home / Self Care | Attending: Family Medicine | Admitting: Family Medicine

## 2012-05-12 DIAGNOSIS — A088 Other specified intestinal infections: Secondary | ICD-10-CM

## 2012-05-12 DIAGNOSIS — A084 Viral intestinal infection, unspecified: Secondary | ICD-10-CM

## 2012-05-12 LAB — POCT RAPID STREP A (OFFICE): Rapid Strep A Screen: NEGATIVE

## 2012-05-12 MED ORDER — PROMETHAZINE HCL 25 MG PO TABS: 25.0000 mg | ORAL_TABLET | Freq: Four times a day (QID) | ORAL | Status: DC | PRN

## 2012-05-12 MED ORDER — ONDANSETRON 4 MG PO TBDP: 4.0000 mg | ORAL_TABLET | Freq: Three times a day (TID) | ORAL | Status: DC | PRN

## 2012-05-12 NOTE — ED Notes (Signed)
Sore throat started x 5 days, last night started vomiting and diarrhea

## 2012-05-12 NOTE — ED Provider Notes (Signed)
History     CSN: [id number]  Arrival date & time 05/12/12  1352   First MD Initiated Contact with Patient 05/12/12 1358      Chief Complaint  Patient presents with  . Sore Throat   HPI  Diarrhea: Onset: 1-2 days  Description: initially with mild sore throat. Multiple sick contacts with viral gastroenteritis. Vomiting and watery diarrhea since yesterday.  Modifying factors: Recently had appendectomy 5-6 weeks ago with secondary flare of colitis postoperatively.    Symptoms: Incontinence: yes Vomiting: yes Abdominal Pain: mild Urgency: yes Relief with defecation: yes Weight loss: yes Decreased urine output: yes Lightheadedness: no Recent travel history: no Sick contacts: yes Suspicious food or water: no Change in diet: no    Red Flags: Fever: no Bloody stools: no Recent antibiotics: 6 weeks ago.  Immuncompromised: no   Past Medical History  Diagnosis Date  . Migraines     MIGRAINES;HAS TAKEN MAXALT AND VICODIN IN THE PAST OR CAFFEINE  . Depression     HAS TAKEN LEXAPRO 40MG  DAILY BEFORE PREGNANCY;WEANED SELF OFF  . Infection     YEAST INF;TYPICALLY AFTER ANTIBXS;NOT FREQ  . Abnormal Pap smear 2005    PRE-CANCEROUS CELLS ON CX;COLPO DONE AND CELLS "BURNED OFF";LAST PAP 2010 OR 2011,WAS NORMAL    Past Surgical History  Procedure Laterality Date  . Wisdom tooth extraction  1998    ALL 4 EXTRACTED  . Colposcopy    . Cryotherapy    . Cesarean section  01/28/2012    Procedure: CESAREAN SECTION;  Surgeon: Esmeralda Arthur, MD;  Location: [facility];  Service: Obstetrics;  Laterality: N/A;  . Laparoscopic appendectomy N/A 04/01/2012    Procedure: APPENDECTOMY LAPAROSCOPIC;  Surgeon: Ardeth Sportsman, MD;  Location: [facility];  Service: General;  Laterality: N/A;  . Appendectomy      Family History  Problem Relation Age of Onset  . Migraines Father     IN THE PAST  . Migraines Paternal Grandfather   . Thyroid disease Mother   . Alzheimer's disease Father     DX'D  @ 25    History  Substance Use Topics  . Smoking status: Never Smoker   . Smokeless tobacco: Never Used  . Alcohol Use: No     Comment: RARELY    OB History   Grav Para Term Preterm Abortions TAB SAB Ect Mult Living   1 1 1  0 0 0 0 0 0 1      Review of Systems  All other systems reviewed and are negative.    Allergies  Review of patient's allergies indicates not on file.  Home Medications   Current Outpatient Rx  Name  Route  Sig  Dispense  Refill  . esomeprazole (NEXIUM) 40 MG capsule   Oral   Take 40 mg by mouth daily before breakfast.         . ibuprofen (ADVIL,MOTRIN) 200 MG tablet   Oral   Take 600 mg by mouth every 6 (six) hours as needed for pain.         Marland Kitchen levonorgestrel (MIRENA) 20 MCG/24HR IUD   Intrauterine   1 each by Intrauterine route once.         Marland Kitchen LORazepam (ATIVAN) 1 MG tablet   Oral   Take 1 mg by mouth every 8 (eight) hours.         . Multiple Vitamins-Minerals (MULTIVITAMIN WITH MINERALS) tablet   Oral   Take 1 tablet by mouth daily.         Marland Kitchen  ondansetron (ZOFRAN ODT) 4 MG disintegrating tablet   Oral   Take 1 tablet (4 mg total) by mouth every 8 (eight) hours as needed for nausea.   20 tablet   0   . sertraline (ZOLOFT) 50 MG tablet   Oral   Take 1 tablet (50 mg total) by mouth daily. For the 1st 6 days please only take 1/2 pill for titrate purposes.   30 tablet   0     BP 110/75  Pulse 106  Temp(Src) 98.8 F (37.1 C) (Oral)  Ht 5' (1.524 m)  Wt 162 lb (73.483 kg)  BMI 31.64 kg/m2  SpO2 97%  Physical Exam  Constitutional: She appears well-developed and well-nourished.  HENT:  Head: Normocephalic and atraumatic.  Right Ear: External ear normal.  Left Ear: External ear normal.  Eyes: Conjunctivae are normal. Pupils are equal, round, and reactive to light.  Neck: Normal range of motion. Neck supple.  Cardiovascular: Normal rate, regular rhythm and normal heart sounds.   Pulmonary/Chest: Effort normal and  breath sounds normal.  Abdominal: Soft.  Musculoskeletal: Normal range of motion.  Neurological: She is alert.  Skin: Skin is warm.    ED Course  Procedures (including critical care time)  Labs Reviewed - No data to display No results found.   1. Viral gastroenteritis       MDM  Symptoms seem most consistent with viral gastroenteritis. Patient is noted to be status post appendectomy approximately 3 or 4 weeks ago with postoperative fever and secondary colitis. Currently there are no clinical signs of bacterial source of symptoms. However, this was discussed at length with patient. Will begin symptomatically with Phenergan and Zofran. If symptoms persist or diarrhea worsens we'll proceed with prophylactic treatment for C. difficile with plan for stool studies including fecal lactoferrin, stool culture, C. difficile as well as CBC with differential. Oral rehydration.  Follow up as needed.     The patient and/or caregiver has been counseled thoroughly with regard to treatment plan and/or medications prescribed including dosage, schedule, interactions, rationale for use, and possible side effects and they verbalize understanding. Diagnoses and expected course of recovery discussed and will return if not improved as expected or if the condition worsens. Patient and/or caregiver verbalized understanding.               Doree Albee, MD 05/12/12 [phone]

## 2012-06-02 ENCOUNTER — Ambulatory Visit (INDEPENDENT_AMBULATORY_CARE_PROVIDER_SITE_OTHER): Payer: Commercial Managed Care - PPO | Admitting: Family Medicine

## 2012-06-02 VITALS — BP 113/78 | HR 80 | Temp 98.1°F | Resp 16 | Ht 60.0 in | Wt 157.0 lb

## 2012-06-02 DIAGNOSIS — J029 Acute pharyngitis, unspecified: Secondary | ICD-10-CM

## 2012-06-02 LAB — POCT RAPID STREP A (OFFICE): Rapid Strep A Screen: NEGATIVE

## 2012-06-02 MED ORDER — MAGIC MOUTHWASH: 5.0000 mL | Freq: Three times a day (TID) | ORAL | Status: DC
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
## 2012-06-10 ENCOUNTER — Ambulatory Visit (INDEPENDENT_AMBULATORY_CARE_PROVIDER_SITE_OTHER): Payer: Commercial Managed Care - PPO | Admitting: Physician Assistant

## 2012-06-10 VITALS — BP 125/80 | HR 73 | Temp 99.5°F | Resp 16 | Ht 60.5 in | Wt 164.0 lb

## 2012-06-10 DIAGNOSIS — F329 Major depressive disorder, single episode, unspecified: Secondary | ICD-10-CM

## 2012-06-10 DIAGNOSIS — L708 Other acne: Secondary | ICD-10-CM

## 2012-06-10 DIAGNOSIS — L709 Acne, unspecified: Secondary | ICD-10-CM

## 2012-06-10 MED ORDER — CLINDAMYCIN PHOS-BENZOYL PEROX 1-5 % EX GEL: Freq: Two times a day (BID) | CUTANEOUS | Status: DC

## 2012-06-10 MED ORDER — CITALOPRAM HYDROBROMIDE 20 MG PO TABS: 20.0000 mg | ORAL_TABLET | Freq: Every day | ORAL | Status: DC

## 2012-06-10 NOTE — Progress Notes (Signed)
   [address] [place] [address]   Phone [phone]  Subjective:    Patient ID: [name] [name], female    DOB: [birth year], 32 y.o.   MRN: [id number]  HPI  Pt presents to clinic for a 1 month recheck.  She is doing much better.  Less tearful episodes, sleeping better (min of 7h a night with minimal disruptions), less moody.  Overall she feels much better but she is concerned that she feels tired all the time.  This is a not a do not want to get out of bed but a fatigue tired.  She is concerned that this started about the time of the start of the Zoloft.  She does wonder if the fatigue is related to decrease in exercise and less time for relaxing but it also concerns her of the timing with the start of the Zoloft.  She would also like to try something for her acne.  Review of Systems  Psychiatric/Behavioral: Positive for dysphoric mood (much improved). Negative for suicidal ideas, sleep disturbance and decreased concentration. The patient is nervous/anxious (significantly improved).        Objective:   Physical Exam  Vitals reviewed. Constitutional: She appears well-developed and well-nourished.  HENT:  Head: Normocephalic and atraumatic.  Right Ear: External ear normal.  Left Ear: External ear normal.  Pulmonary/Chest: Effort normal.  Skin: Skin is warm and dry.  Psychiatric: She has a normal mood and affect. Her behavior is normal. Judgment and thought content normal.  Pt is not tearful in the room, more upbeat than her last OV.      Assessment & Plan:  Depression - Plan: citalopram (CELEXA) 20 MG tablet and d/c Zoloft.  The change will allow [place] to determine whether the Zoloft is helping and causing her fatigue SE.  We will recheck in 1 month to assess her situation and make the next plan.  Acne - Plan: clindamycin-benzoyl peroxide (BENZACLIN) gel  Benny Lennert [facility] 06/10/2012 7:15 PM

## 2012-06-16 ENCOUNTER — Other Ambulatory Visit: Payer: Self-pay | Admitting: Family Medicine

## 2012-06-16 ENCOUNTER — Telehealth: Payer: Self-pay | Admitting: *Deleted

## 2012-06-16 DIAGNOSIS — J329 Chronic sinusitis, unspecified: Secondary | ICD-10-CM

## 2012-06-16 MED ORDER — AMOXICILLIN 875 MG PO TABS: 875.0000 mg | ORAL_TABLET | Freq: Two times a day (BID) | ORAL | Status: DC

## 2012-06-16 NOTE — Telephone Encounter (Signed)
Pt wants Dr. Elbert Ewings to review.

## 2012-06-16 NOTE — Telephone Encounter (Signed)
Pt saw Dr. Elbert Ewings last week for sore throat and now is having cough, nasal congestion, and blowing out green mucous. Is there anything that she can maybe get or take for this?

## 2012-06-16 NOTE — Telephone Encounter (Signed)
Note from OV 4/14 reviewed. She should RTC for re-evaluation.

## 2012-06-16 NOTE — Telephone Encounter (Signed)
Pt made aware

## 2012-07-17 ENCOUNTER — Telehealth: Payer: Self-pay | Admitting: Radiology

## 2012-07-17 DIAGNOSIS — F329 Major depressive disorder, single episode, unspecified: Secondary | ICD-10-CM

## 2012-07-17 MED ORDER — CITALOPRAM HYDROBROMIDE 20 MG PO TABS: 20.0000 mg | ORAL_TABLET | Freq: Every day | ORAL | Status: DC

## 2012-07-17 NOTE — Telephone Encounter (Signed)
Her celexa is working well, no side effects. Can she please get a refill with 90 day supply? Thank you

## 2012-07-17 NOTE — Telephone Encounter (Signed)
Meds sent in and pt to recheck in 3 months.

## 2012-10-07 NOTE — Progress Notes (Signed)
..   Subjective:    [name] [name] is being seen today for her first obstetrical visit.  She is transferring care from [facility].  Pt works at [employer].  Has struggled w/ hyperemesis thus far, but symptoms  Are improving.   She is [redacted]w[redacted]d determined by: Patient's last menstrual period was 04/19/2011..   Relationship w FOB: single "Scott Riddle"  She denies any vag bleeding, cramping, or discharge.  She denies nausea/vomiting presently.  C/o dyspareunia and pelvic pain w/ coughing/sneezing.  Her obstetrical history is significant for: Has been evaluated multiple times since onset of pregnancy for n/v.  Has antiemetics Rx'd. Admitted to [facility] at [redacted]w[redacted]d for RLQ pain and n/v; had normal renal u/s and likely not appendicitis; pain improved w/ IVF and analgesia.  Problem List: 1. H/o depression--Lexapro 40mg  po qd prior to pregnancy--self-weaned 2. H/o migraines--previously on maxalt; vicodin & caffeine prn 3. H/o abnl pap 2005--had cryo  Review of Systems Pertinent ROS is described in HPI   Objective:   BP 90/58  Wt 157 lb (71.215 kg)  BMI 30.9 kg/m2  LMP 04/19/2011 Wt Readings from Last 1 Encounters:  06/10/12 164 lb (74.39 kg)   BMI: Body mass index is 30.9 kg/(m^2).  General: alert, cooperative and no distress HEENT: grossly normal  Thyroid: normal  Respiratory: clear to auscultation bilaterally Cardiovascular: regular rate and rhythm  Breasts:  No dominant masses, nipples erect Gastrointestinal: soft, non-tender; no masses,  no organomegaly Extremities: extremities normal, no pain or edema   EXTERNAL GENITALIA: normal appearing vulva with no masses, tenderness or lesions VAGINA: no abnormal discharge, bleeding or lesions CERVIX: no lesions or cervical motion tenderness; cervix closed, long, firm UTERUS: gravid and consistent with 15-16 weeks ADNEXA: no masses palpable and nontender OB EXAM PELVIMETRY: appears adequate      Assessment:    Primagravida at [redacted]w[redacted]d Improving hyperemesis H/o depression H/o migraines Dyspareunia/pelvic pain Plan:    Records from [facility] not available at time of this visit to review Pap smear collected:  yes GC/Chlamydia collected:  yes Wet prep:  Not done  Discussion of Genetic testing options: unsure if done at [facility] prior to trx of care Problem list reviewed and updated. rv'd how and when to call for emergencies rv'd practice routines Discussed nutrition and exercise and common pregnancy discomforts  Disc'd lubrication, trying different positions for IC, Kegels; MD referral prn  F/u 4 weeks for anatomy scan (Quad if desires and didn't have 1st trimester screen at [facility] if did have 1st trimester screen), Or prn  C. Denny Levy, CNM

## 2012-11-30 ENCOUNTER — Ambulatory Visit (INDEPENDENT_AMBULATORY_CARE_PROVIDER_SITE_OTHER): Payer: 59 | Admitting: Physician Assistant

## 2012-11-30 VITALS — BP 102/60 | HR 81 | Temp 98.0°F | Resp 16 | Ht 65.0 in | Wt 174.6 lb

## 2012-11-30 DIAGNOSIS — F329 Major depressive disorder, single episode, unspecified: Secondary | ICD-10-CM

## 2012-11-30 DIAGNOSIS — Z8659 Personal history of other mental and behavioral disorders: Secondary | ICD-10-CM

## 2012-11-30 DIAGNOSIS — L709 Acne, unspecified: Secondary | ICD-10-CM

## 2012-11-30 DIAGNOSIS — K219 Gastro-esophageal reflux disease without esophagitis: Secondary | ICD-10-CM

## 2012-11-30 DIAGNOSIS — F32A Depression, unspecified: Secondary | ICD-10-CM

## 2012-11-30 DIAGNOSIS — L708 Other acne: Secondary | ICD-10-CM

## 2012-11-30 MED ORDER — CLINDAMYCIN PHOS-BENZOYL PEROX 1-5 % EX GEL: Freq: Two times a day (BID) | CUTANEOUS | Status: DC

## 2012-11-30 MED ORDER — ESCITALOPRAM OXALATE 20 MG PO TABS: 20.0000 mg | ORAL_TABLET | Freq: Every day | ORAL | Status: DC

## 2012-11-30 MED ORDER — LORAZEPAM 0.5 MG PO TABS: 0.2500 mg | ORAL_TABLET | Freq: Two times a day (BID) | ORAL | Status: DC | PRN

## 2012-11-30 MED ORDER — OMEPRAZOLE 40 MG PO CPDR: 40.0000 mg | DELAYED_RELEASE_CAPSULE | Freq: Two times a day (BID) | ORAL | Status: DC

## 2012-11-30 NOTE — Progress Notes (Signed)
   [address], [place] [address]   Phone [phone]  Subjective:    Patient ID: [name] [name], female    DOB: 01/18/1981, 32 y.o.   MRN: [id number]  HPI Pt presents to clinic for recheck of her depression.  She did change her Celexa to her old Lexapro about 2 months ago which she feels like works better than the Celexa was working. She is having episode of lashing out when she gets frustrated and overwhelmed.  This seems to happen more at work when multiple people are asking her to do multiple things at once.  She does not seem to have this at home.  She has been under a recent increase in stress.  Her brother in law recently passed away and he was the care-giver for her father who has Alzheimers.  The plan as of now is for her father to be with her for a month and then with her sister in [place] for a month and for them to rotate. Her father was with her last month for a short time and it was stressful for her and she worries about him at home during the day while she is working.    Review of Systems  Psychiatric/Behavioral: Positive for dysphoric mood. Negative for sleep disturbance. The patient is nervous/anxious (more overwhelmed at times and "losing it").        Objective:   Physical Exam  Vitals reviewed. Constitutional: She is oriented to person, place, and time. She appears well-developed and well-nourished.  HENT:  Head: Normocephalic and atraumatic.  Right Ear: External ear normal.  Left Ear: External ear normal.  Eyes: Conjunctivae are normal.  Neck: Normal range of motion.  Pulmonary/Chest: Effort normal.  Neurological: She is alert and oriented to person, place, and time.  Skin: Skin is warm and dry.  Psychiatric: She has a normal mood and affect. Her behavior is normal. Judgment and thought content normal.       Assessment & Plan:  Acne - continue medications - Plan: clindamycin-benzoyl peroxide (BENZACLIN) gel  Depression - - continue current medications - gave her  Ativan for times when she is feeling overwhelmed  - if these feeling gets worse we may think about additive agents to helps with her increase in stress -- Plan: escitalopram (LEXAPRO) 20 MG tablet, LORazepam (ATIVAN) 0.5 MG tablet  GERD (gastroesophageal reflux disease) - Plan: omeprazole (PRILOSEC) 40 MG capsule  D/w pt sources in [place] to help her with her dad while she is at her house.  Adult day care with community transportation.  We will recheck in 3 months due to upcoming increase stressors.  Benny Lennert PA-C 11/30/2012 3:23 PM

## 2013-01-12 ENCOUNTER — Encounter: Payer: Self-pay | Admitting: Family Medicine

## 2013-01-12 ENCOUNTER — Ambulatory Visit (INDEPENDENT_AMBULATORY_CARE_PROVIDER_SITE_OTHER): Payer: 59 | Admitting: Family Medicine

## 2013-01-12 VITALS — Ht 60.0 in | Wt 165.8 lb

## 2013-01-12 DIAGNOSIS — E669 Obesity, unspecified: Secondary | ICD-10-CM

## 2013-01-12 NOTE — Patient Instructions (Addendum)
-   Obtain twice as many veg's as protein or carbohydrate foods for both lunch and dinner.  - TASTE PREFERENCES ARE LEARNED.  This means that it will get easier to choose foods you know are good for you if you are exposed to them enough.    - Try stir-frying or roasting some veg's.  Prepare foods with nutrition in mind (not just who likes what).     - At least aim for 1 veg serving at both lunch and dinner.  If using canned soup, first microwave frozen veg's, then add soup to reheat.     - Try tri-colored peppers, carrots, green beans, broccoli, sugar snap peas.   - Red flags for poor food decisions:   Hungry  (over-hungry)    Angry   Anxious   Lonely   Tired (and Bored or Depressed) - You may want to experiment with breakfast to include more protein and/or fiber, which will slow digestion, keeping you feeling satisfied longer.    - Steel-cut oats ([employer] or Deep Roots, [employer], [employer]); Adriana Simas a big batch, microwave your portion.  A way to add protein is with nuts/seeds    and/or milk.  (One other option for instant is to add 1/3 c powdered milk per serving.)  - Other breakfast ideas:  Eggs when you have the time; [place] (or other) sandwich.   - Make a list of 7-10 meals that taste good, are relatively quick and easy to prepare, and that meet your nutritional needs.  Bring to your follow-up appointment for review.   - Consider Weigh to Wellness class that starts Jan 6.

## 2013-01-12 NOTE — Progress Notes (Signed)
Medical Nutrition Therapy:  Appt start time: 1100 end time:  1200.  Assessment:  Primary concerns today: Weight management.  [name] [name] would like to lose weight.  She feels that she sometimes eats out of emotion, and is frustrated that she has not been able to manage this effectively.   Usual eating pattern includes 3 meals and 0-5 snacks per day. Usual physical activity includes none recently, but she went to the gym Sat, and is planning to start meeting a friend at the gym 3 X wk, plus 1-2 other times on her own.  Frequent foods include 1 diet soda/day; 1-3 c coffee w/ creamer; light soup or salad; instant low-sugar oatmeal.  Avoided foods include cabbage, Brussels sprouts, most greens, fish/seafood.    24-hr recall: (Up at 7:30 AM) B (8 AM)-   1 c coffee w/ creamer B (8 AM)-   2 eggs in spray oil w/ 2 white toast w/ 2 tsp butter  Snk ( AM)-   none L ( PM)-  Cookout chx quesadilla, water Snk (2 PM)-  1 c coffee w/ creamer, Special K 70-kcal bar D (5 PM)-  3 oz ham, 1 c potatoes, 1 c green beans, water  Snk (8 PM)-  1 c White Cheddar Chex Mix  [place] usually brings her lunch to work, and inst oatmeal for breakfast.    Progress Towards Goal(s):  In progress.   Nutritional Diagnosis:  NB-2.1 Physical inactivity As related to poor motivation.  As evidenced by no regular exercise currently.    Intervention:  Nutrition education.  Monitoring/Evaluation:  Dietary intake, exercise, and body weight in 6 week(s).

## 2013-03-03 ENCOUNTER — Ambulatory Visit: Payer: Self-pay | Admitting: Family Medicine

## 2013-03-09 ENCOUNTER — Other Ambulatory Visit: Payer: Self-pay | Admitting: *Deleted

## 2013-03-09 DIAGNOSIS — J029 Acute pharyngitis, unspecified: Secondary | ICD-10-CM

## 2013-03-10 ENCOUNTER — Other Ambulatory Visit: Payer: Self-pay | Admitting: Family Medicine

## 2013-03-11 LAB — CULTURE, GROUP A STREP: Organism ID, Bacteria: NORMAL

## 2013-03-16 ENCOUNTER — Ambulatory Visit (INDEPENDENT_AMBULATORY_CARE_PROVIDER_SITE_OTHER): Payer: 59 | Admitting: Nurse Practitioner

## 2013-03-16 ENCOUNTER — Encounter: Payer: Self-pay | Admitting: Nurse Practitioner

## 2013-03-16 ENCOUNTER — Encounter: Payer: Self-pay | Admitting: *Deleted

## 2013-03-16 VITALS — BP 106/72 | HR 71 | Temp 97.7°F | Ht 60.0 in | Wt 164.0 lb

## 2013-03-16 DIAGNOSIS — J069 Acute upper respiratory infection, unspecified: Secondary | ICD-10-CM

## 2013-03-16 NOTE — Patient Instructions (Addendum)
You likely have a virus causing your symptoms. The average duration of cold symptoms is 14 days. Start daily sinus rinses (neilmed Sinus Rinse). Use 30 mg to 60 mg pseudoephedrine twice daily. Use ibuprophen for body aches & headache. Sip fluids every hour. Rest. If you are not feeling better in 1 week or develop fever or chest pain, call us for re-evaluation. Feel better!  Upper Respiratory Infection, Adult An upper respiratory infection (URI) is also sometimes known as the common cold. The upper respiratory tract includes the nose, sinuses, throat, trachea, and bronchi. Bronchi are the airways leading to the lungs. Most people improve within 1 week, but symptoms can last up to 2 weeks. A residual cough may last even longer.  CAUSES Many different viruses can infect the tissues lining the upper respiratory tract. The tissues become irritated and inflamed and often become very moist. Mucus production is also common. A cold is contagious. You can easily spread the virus to others by oral contact. This includes kissing, sharing a glass, coughing, or sneezing. Touching your mouth or nose and then touching a surface, which is then touched by another person, can also spread the virus. SYMPTOMS  Symptoms typically develop 1 to 3 days after you come in contact with a cold virus. Symptoms vary from person to person. They may include:  Runny nose.  Sneezing.  Nasal congestion.  Sinus irritation.  Sore throat.  Loss of voice (laryngitis).  Cough.  Fatigue.  Muscle aches.  Loss of appetite.  Headache.  Low-grade fever. DIAGNOSIS  You might diagnose your own cold based on familiar symptoms, since most people get a cold 2 to 3 times a year. Your caregiver can confirm this based on your exam. Most importantly, your caregiver can check that your symptoms are not due to another disease such as strep throat, sinusitis, pneumonia, asthma, or epiglottitis. Blood tests, throat tests, and X-rays are not  necessary to diagnose a common cold, but they may sometimes be helpful in excluding other more serious diseases. Your caregiver will decide if any further tests are required. RISKS AND COMPLICATIONS  You may be at risk for a more severe case of the common cold if you smoke cigarettes, have chronic heart disease (such as heart failure) or lung disease (such as asthma), or if you have a weakened immune system. The very young and very old are also at risk for more serious infections. Bacterial sinusitis, middle ear infections, and bacterial pneumonia can complicate the common cold. The common cold can worsen asthma and chronic obstructive pulmonary disease (COPD). Sometimes, these complications can require emergency medical care and may be life-threatening. PREVENTION  The best way to protect against getting a cold is to practice good hygiene. Avoid oral or hand contact with people with cold symptoms. Wash your hands often if contact occurs. There is no clear evidence that vitamin C, vitamin E, echinacea, or exercise reduces the chance of developing a cold. However, it is always recommended to get plenty of rest and practice good nutrition. TREATMENT  Treatment is directed at relieving symptoms. There is no cure. Antibiotics are not effective, because the infection is caused by a virus, not by bacteria. Treatment may include:  Increased fluid intake. Sports drinks offer valuable electrolytes, sugars, and fluids.  Breathing heated mist or steam (vaporizer or shower).  Eating chicken soup or other clear broths, and maintaining good nutrition.  Getting plenty of rest.  Using gargles or lozenges for comfort.  Controlling fevers with ibuprofen or  acetaminophen as directed by your caregiver.  Increasing usage of your inhaler if you have asthma. Zinc gel and zinc lozenges, taken in the first 24 hours of the common cold, can shorten the duration and lessen the severity of symptoms. Pain medicines may help  with fever, muscle aches, and throat pain. A variety of non-prescription medicines are available to treat congestion and runny nose. Your caregiver can make recommendations and may suggest nasal or lung inhalers for other symptoms.  HOME CARE INSTRUCTIONS   Only take over-the-counter or prescription medicines for pain, discomfort, or fever as directed by your caregiver.  Use a warm mist humidifier or inhale steam from a shower to increase air moisture. This may keep secretions moist and make it easier to breathe.  Drink enough water and fluids to keep your urine clear or pale yellow.  Rest as needed.  Return to work when your temperature has returned to normal or as your caregiver advises. You may need to stay home longer to avoid infecting others. You can also use a face mask and careful hand washing to prevent spread of the virus. SEEK MEDICAL CARE IF:   After the first few days, you feel you are getting worse rather than better.  You need your caregiver's advice about medicines to control symptoms.  You develop chills, worsening shortness of breath, or brown or red sputum. These may be signs of pneumonia.  You develop yellow or brown nasal discharge or pain in the face, especially when you bend forward. These may be signs of sinusitis.  You develop a fever, swollen neck glands, pain with swallowing, or white areas in the back of your throat. These may be signs of strep throat. SEEK IMMEDIATE MEDICAL CARE IF:   You have a fever.  You develop severe or persistent headache, ear pain, sinus pain, or chest pain.  You develop wheezing, a prolonged cough, cough up blood, or have a change in your usual mucus (if you have chronic lung disease).  You develop sore muscles or a stiff neck. Document Released: 08/01/2000 Document Revised: 04/30/2011 Document Reviewed: 06/09/2010 [facility] Patient Information 2014 [place], [place].

## 2013-03-16 NOTE — Progress Notes (Signed)
Pre-visit discussion using our clinic review tool. No additional management support is needed unless otherwise documented below in the visit note.  

## 2013-03-16 NOTE — Progress Notes (Signed)
   Subjective:    Patient ID: [name] [name], female    DOB: 05-09-1980, 33 y.o.   MRN: [id number]  URI  This is a new problem. The current episode started today. The problem has been gradually worsening. There has been no fever. Associated symptoms include congestion (nasal), headaches and neck pain. Pertinent negatives include no abdominal pain, chest pain, coughing, diarrhea, ear pain, nausea, rash, sore throat, vomiting or wheezing. Treatments tried: thera flu. The treatment provided no relief.      Review of Systems  Constitutional: Negative for fever, chills and fatigue.  HENT: Positive for congestion (nasal). Negative for ear pain, sinus pressure and sore throat.   Respiratory: Negative for cough and wheezing.   Cardiovascular: Negative for chest pain.  Gastrointestinal: Negative for nausea, vomiting, abdominal pain and diarrhea.  Musculoskeletal: Positive for neck pain.  Skin: Negative for rash.  Neurological: Positive for headaches.       Objective:   Physical Exam  Vitals reviewed. Constitutional: She is oriented to person, place, and time. She appears well-developed and well-nourished. No distress.  HENT:  Head: Normocephalic and atraumatic.  Right Ear: External ear normal.  Left Ear: External ear normal.  Mouth/Throat: No oropharyngeal exudate.  Bilateral effusion, clear fluid. Posterior pharynx slightly red.  Eyes: Conjunctivae are normal. Right eye exhibits no discharge. Left eye exhibits no discharge.  Neck: Normal range of motion. Neck supple. No tracheal deviation present. No thyromegaly present.  Cardiovascular: Normal rate and normal heart sounds.   No murmur heard. Pulmonary/Chest: Effort normal and breath sounds normal. No respiratory distress. She has no wheezes.  Musculoskeletal: Normal range of motion.  Neurological: She is alert and oriented to person, place, and time.  Skin: Skin is warm and dry.  Psychiatric: She has a normal mood and affect. Thought  content normal.          Assessment & Plan:   1. Upper respiratory infection Onset today. No fever.  See pt instructions.

## 2013-03-27 ENCOUNTER — Telehealth: Payer: Self-pay

## 2013-03-27 NOTE — Telephone Encounter (Signed)
Pt called and states she thinks her depression meds are no longer working. All she wants to do is sleep and she doesn't feel like herself lately. She is on Lexapro 40 mg. She wants to see if she can add another medication to help with this. Please advise.

## 2013-03-30 NOTE — Telephone Encounter (Signed)
Needs OV.  

## 2013-03-30 NOTE — Telephone Encounter (Signed)
Pt notified, LMOM  

## 2013-04-22 ENCOUNTER — Encounter: Payer: Self-pay | Admitting: Physician Assistant

## 2013-04-22 ENCOUNTER — Other Ambulatory Visit: Payer: Self-pay | Admitting: Family Medicine

## 2013-04-22 ENCOUNTER — Ambulatory Visit (INDEPENDENT_AMBULATORY_CARE_PROVIDER_SITE_OTHER): Payer: 59 | Admitting: Physician Assistant

## 2013-04-22 VITALS — BP 112/70 | HR 74 | Temp 98.3°F | Resp 16 | Ht 59.5 in | Wt 170.0 lb

## 2013-04-22 DIAGNOSIS — N63 Unspecified lump in unspecified breast: Secondary | ICD-10-CM

## 2013-04-22 NOTE — Progress Notes (Signed)
Pt came to me reporting that she palpated a small nodule in upper outer quadrant of left breast this morning. Pt's maternal GM had breast cancer. I will order mammogram and ultrasound of left [facility].

## 2013-04-22 NOTE — Progress Notes (Signed)
   Subjective:    Patient ID: [name] [name], female    DOB: 1980-09-28, 33 y.o.   MRN: [id number]  HPI Pt presents to clinic with concerns regarding a left breast lump that is not tender that she found this am.  She has never felt a bump before and she is concerned because her maternal grandmother had breast cancer though she has no idea when she was diagnosed but she does know that she had a mastectomy for her treatment.  She does not have a menses cycle because she has a Mirena and she has 2 cups of coffee a day but no other caffeine use.  She has never had had a breast lump before.  She does have a mammogram and US scheduled for 3/13 but she is here for an exam and counseling.  She is having no nipple discharge and the breast does not look different than normal.  Review of Systems     Objective:   Physical Exam  Constitutional: She is oriented to person, place, and time. She appears well-developed and well-nourished.  HENT:  Head: Normocephalic and atraumatic.  Right Ear: External ear normal.  Left Ear: External ear normal.  Pulmonary/Chest: Effort normal.  Genitourinary: There is breast swelling (1 cm non-tender mobile circular palpable mass out upper quadrant felt best in the uprigth sitting position). No breast tenderness, discharge or bleeding.  Neurological: She is alert and oriented to person, place, and time.  Psychiatric: She has a normal mood and affect. Her behavior is normal. Judgment and thought content normal.      Assessment & Plan:  Breast lump in upper outer quadrant - pt to follow through with the US and mammogram - the most likely is that the lump is cystic change.  Benny LennertSarah Weber PA-C 04/22/2013 6:42 PM

## 2013-04-30 ENCOUNTER — Ambulatory Visit
Admission: RE | Admit: 2013-04-30 | Discharge: 2013-04-30 | Disposition: A | Discharge status: Home / Self Care | Payer: Self-pay | Source: Ambulatory Visit | Attending: Family Medicine | Admitting: Family Medicine

## 2013-04-30 DIAGNOSIS — N63 Unspecified lump in unspecified breast: Secondary | ICD-10-CM

## 2013-05-01 ENCOUNTER — Other Ambulatory Visit: Payer: Self-pay

## 2013-08-06 ENCOUNTER — Telehealth: Payer: Self-pay

## 2013-08-06 DIAGNOSIS — F32A Depression, unspecified: Secondary | ICD-10-CM

## 2013-08-06 DIAGNOSIS — F329 Major depressive disorder, single episode, unspecified: Secondary | ICD-10-CM

## 2013-08-06 MED ORDER — LORAZEPAM 0.5 MG PO TABS: 0.2500 mg | ORAL_TABLET | Freq: Two times a day (BID) | ORAL | Status: DC | PRN

## 2013-08-06 NOTE — Telephone Encounter (Signed)
Pt requesting a RF of ativan please. Thanks

## 2013-08-06 NOTE — Telephone Encounter (Signed)
Faxed, pt notified

## 2013-08-06 NOTE — Telephone Encounter (Signed)
Ready

## 2013-09-15 ENCOUNTER — Other Ambulatory Visit: Payer: Self-pay | Admitting: *Deleted

## 2013-09-15 NOTE — Telephone Encounter (Signed)
Needs refill on omeprazole for a 90 day supply.

## 2013-09-16 MED ORDER — OMEPRAZOLE 40 MG PO CPDR: 40.0000 mg | DELAYED_RELEASE_CAPSULE | Freq: Two times a day (BID) | ORAL | Status: DC

## 2013-09-16 NOTE — Telephone Encounter (Signed)
Notified Rodnesha on VM that RF was sent for 90 days.

## 2013-09-16 NOTE — Telephone Encounter (Signed)
Is it OK to give Misty StanleyLisa 90 day RF or does she need to be seen for GERD again first?

## 2013-09-17 ENCOUNTER — Encounter: Payer: Self-pay | Admitting: Family Medicine

## 2013-11-29 ENCOUNTER — Ambulatory Visit (INDEPENDENT_AMBULATORY_CARE_PROVIDER_SITE_OTHER): Payer: 59 | Admitting: Physician Assistant

## 2013-11-29 VITALS — BP 112/66 | HR 87 | Temp 98.9°F | Resp 18 | Ht 60.0 in | Wt 167.0 lb

## 2013-11-29 DIAGNOSIS — F32A Depression, unspecified: Secondary | ICD-10-CM

## 2013-11-29 DIAGNOSIS — K219 Gastro-esophageal reflux disease without esophagitis: Secondary | ICD-10-CM

## 2013-11-29 DIAGNOSIS — F329 Major depressive disorder, single episode, unspecified: Secondary | ICD-10-CM

## 2013-11-29 DIAGNOSIS — Z0000 Encounter for general adult medical examination without abnormal findings: Secondary | ICD-10-CM

## 2013-11-29 DIAGNOSIS — Z6379 Other stressful life events affecting family and household: Secondary | ICD-10-CM

## 2013-11-29 DIAGNOSIS — G4700 Insomnia, unspecified: Secondary | ICD-10-CM

## 2013-11-29 MED ORDER — LORAZEPAM 0.5 MG PO TABS: 0.2500 mg | ORAL_TABLET | Freq: Two times a day (BID) | ORAL | Status: DC | PRN

## 2013-11-29 MED ORDER — OMEPRAZOLE 40 MG PO CPDR: 40.0000 mg | DELAYED_RELEASE_CAPSULE | Freq: Two times a day (BID) | ORAL | Status: DC

## 2013-11-29 MED ORDER — ESCITALOPRAM OXALATE 20 MG PO TABS: 20.0000 mg | ORAL_TABLET | Freq: Every day | ORAL | Status: DC

## 2013-11-29 NOTE — Progress Notes (Signed)
Subjective:    Patient ID: [name] [name], female    DOB: 12-16-1980, 33 y.o.   MRN: [id number]  HPI Pt presents to clinic for her annual PE.  She has a GYN that does her pap smears and she is UTD.  She is having no pyhsical complaints.  She is having more anger issues and getting mad easier. Her depression was well controlled on Lexapro 20mg  but she went to a weight loss clinic and they switched her to Prozac 20mg  and see feels like her depression is not as well controlled.  She also has not lost weight.  She realized that she was losing weight when she was walking an hour a day but since it has been getting darker earlier she has stopped exercising she has noticed that her weight loss has stopped.  She is having increased stress with her dad who has been with them since June - his Alzheimer is getting worse and she is struggling more but she still feels like it is the right thing to have him live with them.  They are interested in having another child but she does not know how she can do it with her dad.  She feels like her increased depression might also be related to when he came to live with them.  She takes her Ativan about once a week to sleep when she is really stressed out.  Review of Systems  Constitutional: Negative.   HENT: Negative.   Eyes: Negative.   Respiratory: Negative.   Endocrine: Negative.   Genitourinary: Negative.   Musculoskeletal: Negative.   Allergic/Immunologic: Negative.   Neurological: Negative.   Hematological: Negative.   Psychiatric/Behavioral: Positive for dysphoric mood. The patient is nervous/anxious.        Objective:   Physical Exam  Vitals reviewed. Constitutional: She is oriented to person, place, and time. She appears well-developed and well-nourished.  HENT:  Head: Normocephalic and atraumatic.  Right Ear: Hearing, tympanic membrane, external ear and ear canal normal.  Left Ear: Hearing, tympanic membrane, external ear and ear canal normal.    Nose: Nose normal.  Mouth/Throat: Uvula is midline, oropharynx is clear and moist and mucous membranes are normal.  Eyes: Conjunctivae and EOM are normal. Pupils are equal, round, and reactive to light.  Neck: Normal range of motion. Neck supple. No thyromegaly present.  Cardiovascular: Normal rate, regular rhythm and normal heart sounds.   No murmur heard. Pulmonary/Chest: Effort normal and breath sounds normal.  Abdominal: Soft. Bowel sounds are normal. There is no tenderness.  Lymphadenopathy:    She has no cervical adenopathy.  Neurological: She is alert and oriented to person, place, and time.  Skin: Skin is warm and dry.  Psychiatric: She has a normal mood and affect. Her behavior is normal. Judgment and thought content normal.        Assessment & Plan:  Annual physical exam  Depression - Plan: escitalopram (LEXAPRO) 20 MG tablet  Insomnia - Plan: LORazepam (ATIVAN) 0.5 MG tablet  Gastroesophageal reflux disease without esophagitis - Plan: omeprazole (PRILOSEC) 40 MG capsule  Stress due to illness of family member  Refill daily medications.  We are going to change back to lexapro because she did not have the anticipated weight loss with Prozac and her depression is not as well controlled.  She has increased significant family stressors that are not going to change any time soon.  She will have TSH, LIPID, and CMET with GRF drawn at work because it is  less cost to her.  She will get me those results.  Benny LennertSarah Aziyah Provencal PA-C  [facility] 11/29/2013 12:50 PM

## 2013-11-29 NOTE — Patient Instructions (Signed)
Please have TSH, fasting lipid, CMET with GFR and CBC drawn at work and send the results to me for evaluation.

## 2013-11-30 ENCOUNTER — Other Ambulatory Visit: Payer: 59

## 2013-11-30 DIAGNOSIS — Z0000 Encounter for general adult medical examination without abnormal findings: Secondary | ICD-10-CM

## 2013-11-30 LAB — CBC WITH DIFFERENTIAL/PLATELET
BASOS ABS: 0 10*3/uL (ref 0.0–0.1)
Basophils Relative: 0.5 % (ref 0.0–3.0)
EOS ABS: 0.1 10*3/uL (ref 0.0–0.7)
Eosinophils Relative: 1.6 % (ref 0.0–5.0)
HEMATOCRIT: 40.8 % (ref 36.0–46.0)
Hemoglobin: 13.4 g/dL (ref 12.0–15.0)
LYMPHS ABS: 1.8 10*3/uL (ref 0.7–4.0)
LYMPHS PCT: 28.3 % (ref 12.0–46.0)
MCHC: 33 g/dL (ref 30.0–36.0)
MCV: 90.4 fl (ref 78.0–100.0)
Monocytes Absolute: 0.5 10*3/uL (ref 0.1–1.0)
Monocytes Relative: 7.9 % (ref 3.0–12.0)
Neutro Abs: 3.8 10*3/uL (ref 1.4–7.7)
Neutrophils Relative %: 61.7 % (ref 43.0–77.0)
PLATELETS: 212 10*3/uL (ref 150.0–400.0)
RBC: 4.51 Mil/uL (ref 3.87–5.11)
RDW: 13.2 % (ref 11.5–15.5)
WBC: 6.2 10*3/uL (ref 4.0–10.5)

## 2013-11-30 LAB — COMPLETE METABOLIC PANEL WITH GFR
ALBUMIN: 4 g/dL (ref 3.5–5.2)
ALT: 12 U/L (ref 0–35)
AST: 15 U/L (ref 0–37)
Alkaline Phosphatase: 86 U/L (ref 39–117)
BUN: 16 mg/dL (ref 6–23)
CO2: 26 mEq/L (ref 19–32)
Calcium: 9.3 mg/dL (ref 8.4–10.5)
Chloride: 105 mEq/L (ref 96–112)
Creat: 0.66 mg/dL (ref 0.50–1.10)
GFR, Est Non African American: >89 mL/min
GLUCOSE: 58 mg/dL — AB (ref 70–99)
POTASSIUM: 4.3 meq/L (ref 3.5–5.3)
SODIUM: 140 meq/L (ref 135–145)
TOTAL PROTEIN: 6.5 g/dL (ref 6.0–8.3)
Total Bilirubin: 0.4 mg/dL (ref 0.2–1.2)

## 2013-11-30 LAB — LIPID PANEL
CHOLESTEROL: 243 mg/dL — AB (ref 0–200)
HDL: 50.2 mg/dL (ref >39.00)
LDL Cholesterol: 166 mg/dL — ABNORMAL HIGH (ref 0–99)
NonHDL: 192.8
Total CHOL/HDL Ratio: 5
Triglycerides: 136 mg/dL (ref 0.0–149.0)
VLDL: 27.2 mg/dL (ref 0.0–40.0)

## 2013-11-30 LAB — TSH: TSH: 1.82 u[IU]/mL (ref 0.35–4.50)

## 2013-12-01 ENCOUNTER — Encounter: Payer: Self-pay | Admitting: Physician Assistant

## 2013-12-29 IMAGING — CT CT ABD-PELV W/ CM
2 of 4 series · 16 of 46 positions shown, 18 images · IV contrast (omnipaque)
Comparison: Right lower quadrant abdominal and renal ultrasound
performed 07/07/2011

CLINICAL DATA: Right lower quadrant abdominal pain, nausea and
vomiting.

CT ABDOMEN AND PELVIS WITH CONTRAST
TECHNIQUE: Multidetector CT imaging of the abdomen and pelvis was
performed following the standard protocol during bolus
administration of intravenous contrast.
Contrast: 100 mL of Omnipaque 300 IV contrast

[Series 2: abd/pelvis 5.0 b31f · axial · 0.78mm/px · z∈[+873,+1288]mm · 13 of 93 slices shown, 15 images]
[im 5/93  soft-tissue]
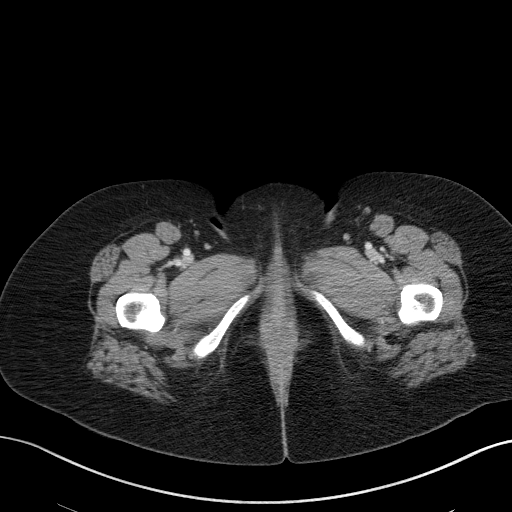
[im 5/93  bone]
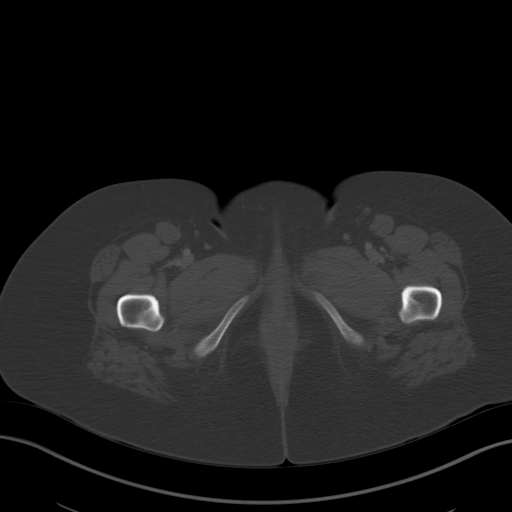
[im 13/93  soft-tissue]
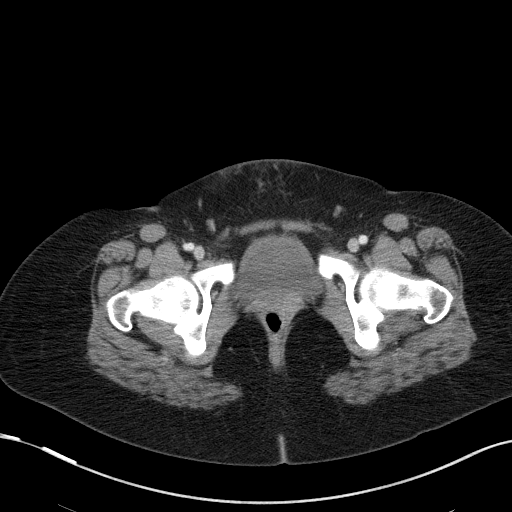
[im 21/93  soft-tissue]
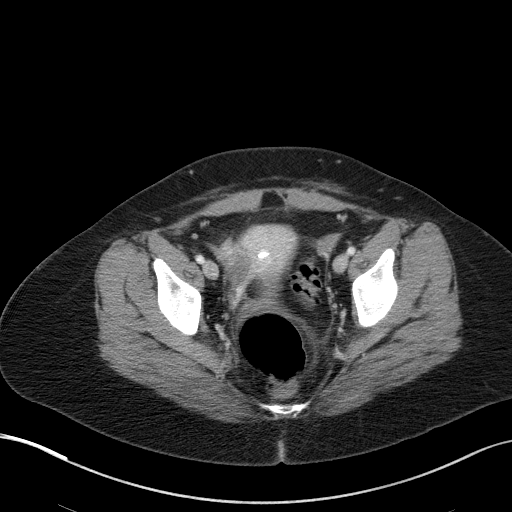
[im 26/93  soft-tissue]
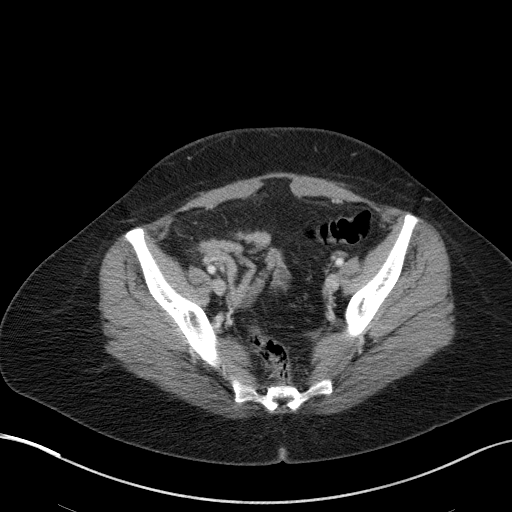
[im 34/93  soft-tissue]
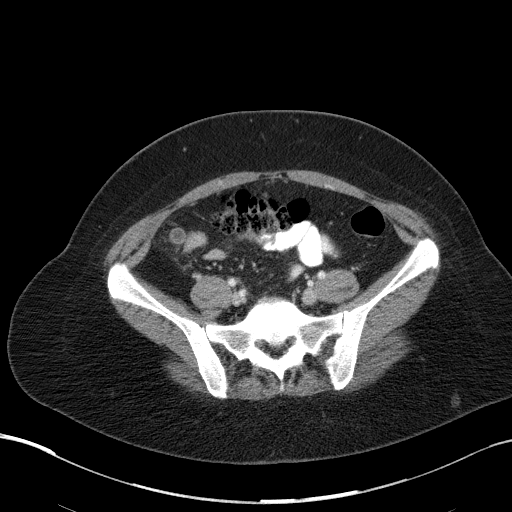
[im 38/93  soft-tissue]
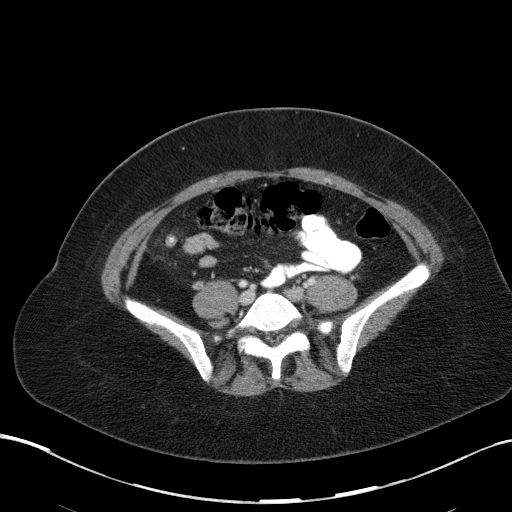
[im 47/93  soft-tissue]
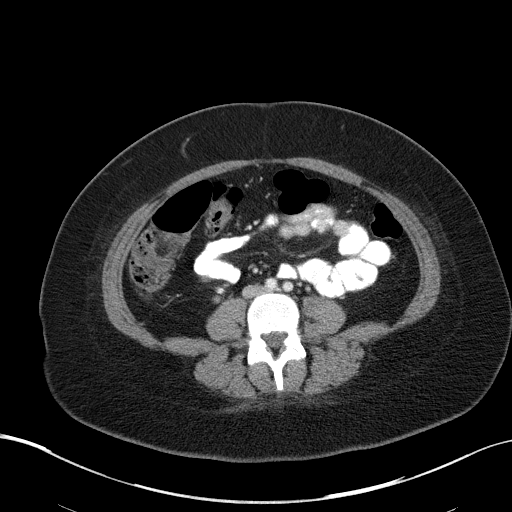
[im 55/93  soft-tissue]
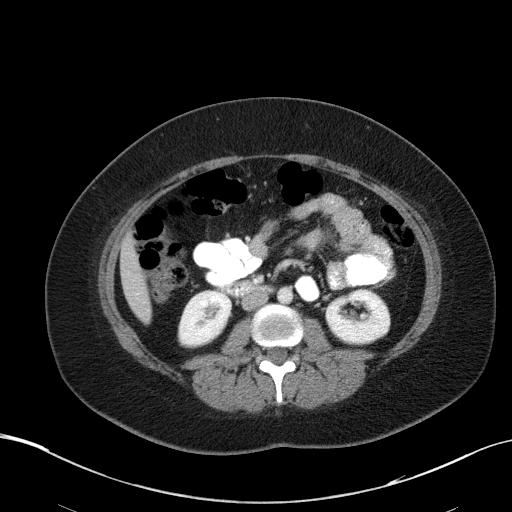
[im 59/93  soft-tissue]
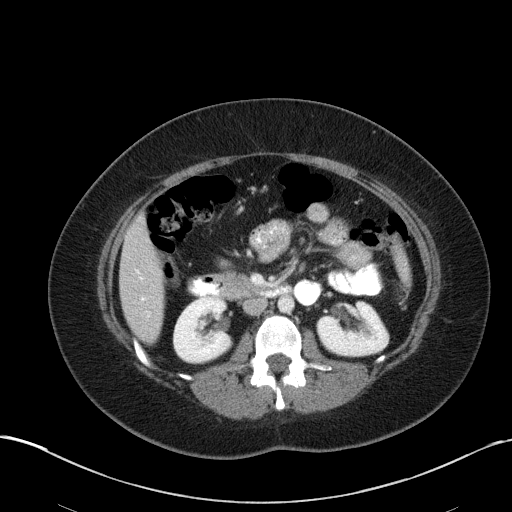
[im 59/93  bone]
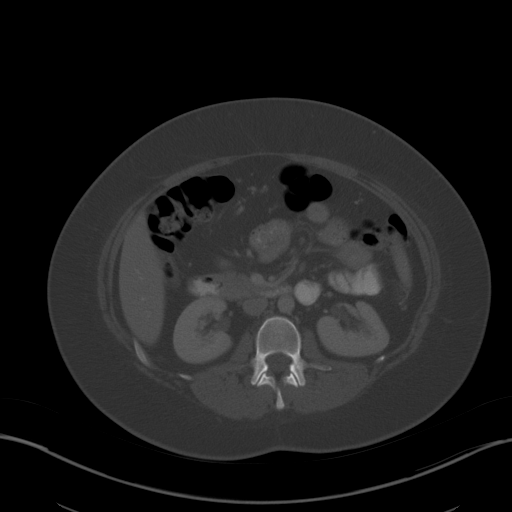
[im 67/93  soft-tissue]
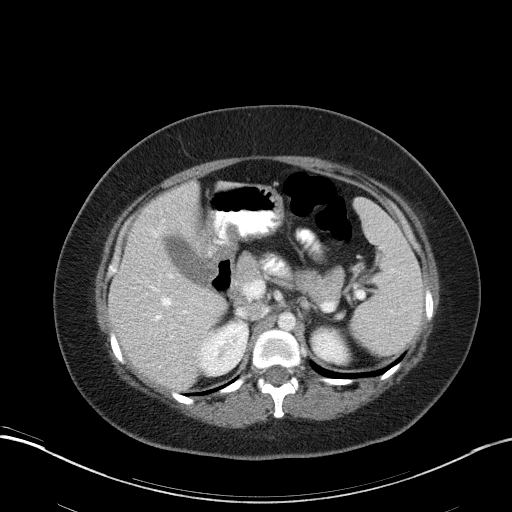
[im 72/93  soft-tissue]
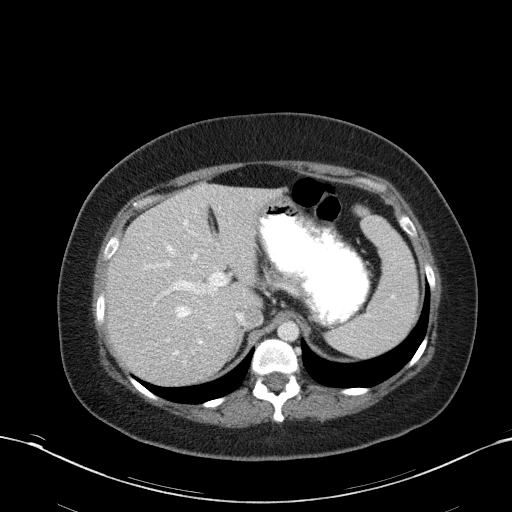
[im 80/93  soft-tissue]
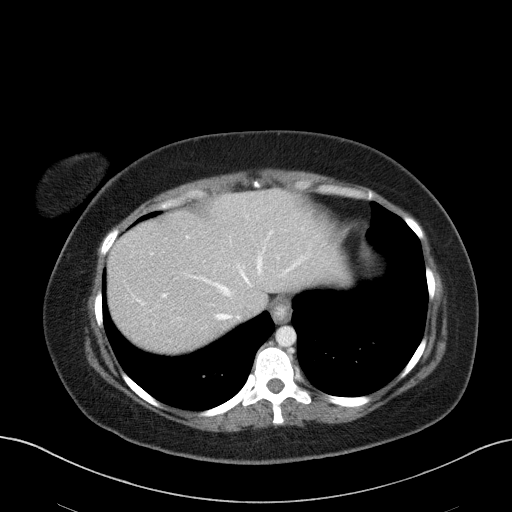
[im 88/93  soft-tissue]
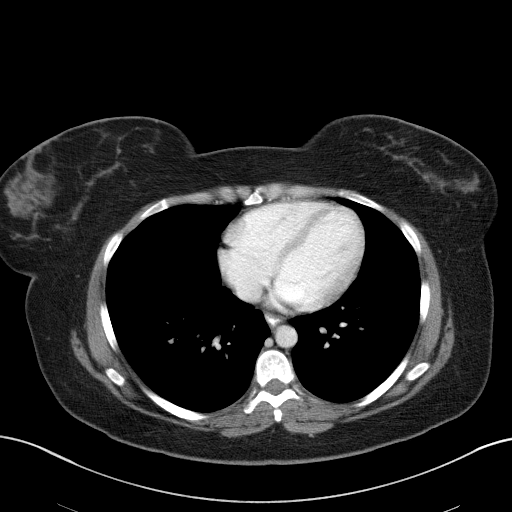

[Series 5: abd/pelvis 3.0 coronal · coronal · 0.84mm/px · 3 of 94 slices shown]
[im 32/94  soft-tissue]
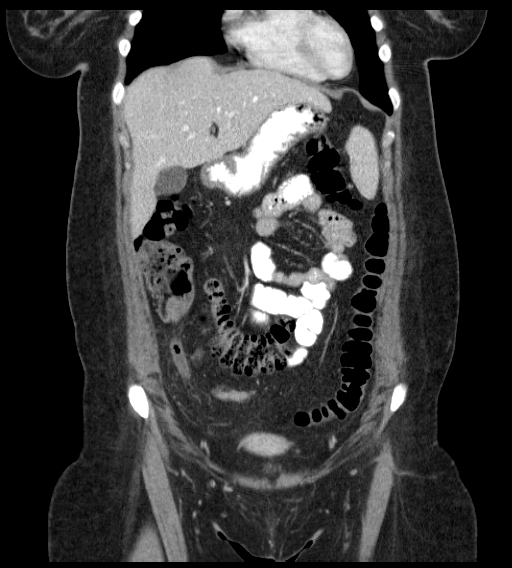
[im 42/94  soft-tissue]
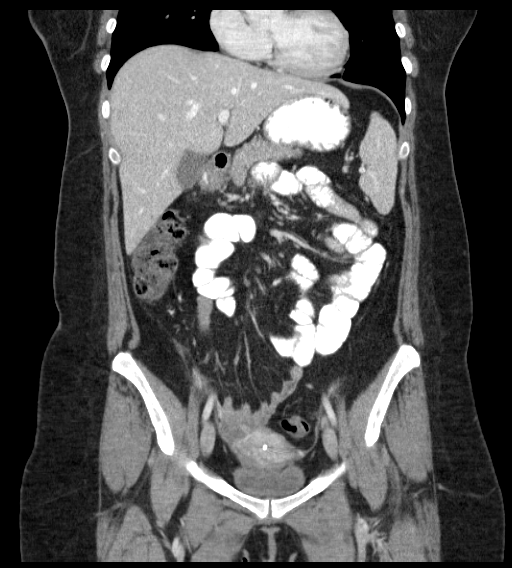
[im 52/94  soft-tissue]
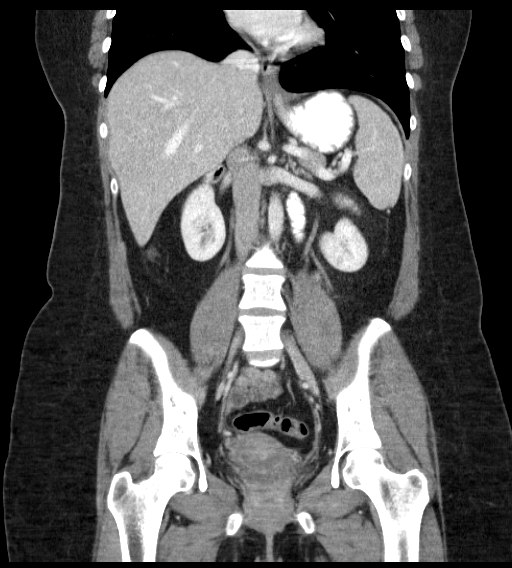

[16 of 46 positions shown; findings below may reference images not displayed]

FINDINGS: The visualized lung bases are clear.

The liver and spleen are unremarkable in appearance.  The
gallbladder is within normal limits.  The pancreas and adrenal
glands are unremarkable.

The kidneys are unremarkable in appearance.  There is no evidence
of hydronephrosis.  No renal or ureteral stones are seen.  No
perinephric stranding is appreciated.

The small bowel is unremarkable in appearance.  The stomach is
within normal limits.  No acute vascular abnormalities are seen.

The appendix is distended up to 1.3 cm in maximal diameter, with
associated soft tissue inflammation and trace free fluid.  Several
appendicoliths are noted within the appendix.  This is compatible
with acute appendicitis.  There is no evidence of perforation or
abscess formation.  The appendix extends inferior to the cecum.

The colon is unremarkable in appearance.  Trace free fluid within
the pelvis may be physiologic in nature.

The bladder is mildly distended and grossly unremarkable.  The
uterus is within normal limits.  The intrauterine device is
somewhat unusually angulated, though at the fundus; it may be
extending into the myometrium both anteriorly and posteriorly,
nearly to the serosa posteriorly. The ovaries are grossly
symmetric; no suspicious adnexal masses are seen.  No inguinal
lymphadenopathy is seen.

No acute osseous abnormalities are identified.
IMPRESSION: 1.  Acute appendicitis noted, with distension of the appendix to
1.3 cm in maximal diameter, and associated soft tissue inflammation
and trace free fluid.  Several small appendicoliths noted in the
appendix.  No evidence of perforation or abscess formation.
2.  Somewhat unusual angulation of the intrauterine device, though
it is at the fundus.  It may be extending into the myometrium both
anteriorly and posteriorly, nearly extending to the serosa
posteriorly.  Would evaluate further on a non-emergent basis, when
and as deemed clinically appropriate.

to Dr. Nanti Mathias, who verbally acknowledged these
results.

## 2014-01-05 ENCOUNTER — Other Ambulatory Visit: Payer: Self-pay | Admitting: Physician Assistant

## 2014-01-05 DIAGNOSIS — F329 Major depressive disorder, single episode, unspecified: Secondary | ICD-10-CM

## 2014-01-05 DIAGNOSIS — F32A Depression, unspecified: Secondary | ICD-10-CM

## 2014-01-05 MED ORDER — ESCITALOPRAM OXALATE 20 MG PO TABS: 20.0000 mg | ORAL_TABLET | Freq: Every day | ORAL | Status: DC

## 2014-01-11 ENCOUNTER — Ambulatory Visit (INDEPENDENT_AMBULATORY_CARE_PROVIDER_SITE_OTHER): Payer: 59 | Admitting: Nurse Practitioner

## 2014-01-11 ENCOUNTER — Encounter: Payer: Self-pay | Admitting: Nurse Practitioner

## 2014-01-11 VITALS — BP 102/71 | HR 110 | Temp 97.6°F | Resp 18 | Wt 166.0 lb

## 2014-01-11 DIAGNOSIS — L700 Acne vulgaris: Secondary | ICD-10-CM

## 2014-01-11 MED ORDER — MINOCYCLINE HCL 50 MG PO TABS: 50.0000 mg | ORAL_TABLET | Freq: Two times a day (BID) | ORAL | Status: DC

## 2014-01-11 NOTE — Progress Notes (Signed)
Subjective:     [name] [name] is a 33 y.o. female who presents for evaluation of acne. She reports acne since early 20's. She has been using clindamycin topical for about 3 years. She uses various face washes-current one has salicylic acid. Recently, she tried a coconut oil wash & noticed increased in acne along jawline. Acne is mild to moderate and contributes to altered self-image.  The following portions of the patient's history were reviewed and updated as appropriate: allergies, current medications, past medical history, past social history, past surgical history and problem list.  Review of Systems Constitutional: negative for fevers Integument/breast: positive for acne pustules, no cystic lesions Allergic/Immunologic: negative for urticaria    Objective:    BP 102/71 mmHg  Pulse 110  Temp(Src) 97.6 F (36.4 C) (Temporal)  Resp 18  Wt 166 lb (75.297 kg)  SpO2 98% Lesion location:  back and face & chest  Appearance:  closed comedones     Assessment: plan   1. Acne vulgaris - minocycline (DYNACIN) 50 MG tablet; Take 1 tablet (50 mg total) by mouth 2 (two) times daily.  Dispense: 60 tablet; Refill: 2 Continue salicylic acid wash Add benzoyl peroxide Add sulphur mask Daily probiotics Ret in 8 weeks.

## 2014-01-11 NOTE — Progress Notes (Signed)
Pre visit review using our clinic review tool, if applicable. No additional management support is needed unless otherwise documented below in the visit note. 

## 2014-01-11 NOTE — Patient Instructions (Signed)
Start antibiotic. Use every 12 hours. You can use this for 12 weeks. Take [employer] probiotics -1 capsule daily at lunch while on antibiotic.  Add 1 to 2% benzoyl peroxide daily to twice daily.  Continue salicylic acid products daily to twice daily.  Get sulphur mask if you can find one. Proactiv has one called "skin purifying mask". Use 1 to 2 times weekly.  Return in 8 weeks.

## 2014-03-14 ENCOUNTER — Emergency Department
Admission: EM | Admit: 2014-03-14 | Discharge: 2014-03-14 | Disposition: A | Discharge status: Home / Self Care | Payer: 59 | Source: Home / Self Care | Attending: Emergency Medicine | Admitting: Emergency Medicine

## 2014-03-14 DIAGNOSIS — J0120 Acute ethmoidal sinusitis, unspecified: Secondary | ICD-10-CM

## 2014-03-14 DIAGNOSIS — J0121 Acute recurrent ethmoidal sinusitis: Secondary | ICD-10-CM | POA: Diagnosis not present

## 2014-03-14 MED ORDER — AMOXICILLIN 500 MG PO CAPS: 500.0000 mg | ORAL_CAPSULE | Freq: Three times a day (TID) | ORAL | Status: DC

## 2014-03-14 NOTE — ED Provider Notes (Signed)
CSN: [id number]     Arrival date & time 03/14/14  1110 History   First MD Initiated Contact with Patient 03/14/14 1132     Chief Complaint  Patient presents with  . Cough   (Consider location/radiation/quality/duration/timing/severity/associated sxs/prior Treatment) Patient is a 34 y.o. female presenting with cough. The history is provided by the patient. No language interpreter was used.  Cough Cough characteristics:  Non-productive Severity:  Moderate Onset quality:  Gradual Duration:  1 week Timing:  Constant Progression:  Worsening Chronicity:  New Smoker: no   Relieved by:  Nothing Worsened by:  Nothing tried Ineffective treatments:  None tried Associated symptoms: sinus congestion and sore throat   Associated symptoms: no shortness of breath     Past Medical History  Diagnosis Date  . Migraines     MIGRAINES;HAS TAKEN MAXALT AND VICODIN IN THE PAST OR CAFFEINE  . Depression     HAS TAKEN LEXAPRO  DAILY BEFORE PREGNANCY;WEANED SELF OFF  . Infection     YEAST INF;TYPICALLY AFTER ANTIBXS;NOT FREQ  . Abnormal Pap smear 2005    PRE-CANCEROUS CELLS ON CX;COLPO DONE AND CELLS "BURNED OFF";LAST PAP 2010 OR 2011,WAS NORMAL  . Anxiety    Past Surgical History  Procedure Laterality Date  . Wisdom tooth extraction  1998    ALL 4 EXTRACTED  . Colposcopy    . Cryotherapy    . Cesarean section  01/28/2012    Procedure: CESAREAN SECTION;  Surgeon: Esmeralda Arthur, MD;  Location: [facility];  Service: Obstetrics;  Laterality: N/A;  . Laparoscopic appendectomy N/A 04/01/2012    Procedure: APPENDECTOMY LAPAROSCOPIC;  Surgeon: Ardeth Sportsman, MD;  Location: [facility];  Service: General;  Laterality: N/A;  . Appendectomy     Family History  Problem Relation Age of Onset  . Migraines Father     IN THE PAST  . Alzheimer's disease Father     DX'D @ 64  . Migraines Paternal Grandfather   . Thyroid disease Mother   . Breast cancer Paternal Aunt   . Cancer Paternal Aunt   . Cancer  Maternal Grandmother   . Breast cancer Maternal Grandmother    History  Substance Use Topics  . Smoking status: Never Smoker   . Smokeless tobacco: Never Used  . Alcohol Use: No     Comment: RARELY   OB History    Gravida Para Term Preterm AB TAB SAB Ectopic Multiple Living   0 0 0 0 0 0 1     Review of Systems  HENT: Positive for sore throat.   Respiratory: Positive for cough. Negative for shortness of breath.   All other systems reviewed and are negative.   Allergies  Review of patient's allergies indicates no known allergies.  Home Medications   Prior to Admission medications   Medication Sig Start Date End Date Taking? Authorizing Provider  escitalopram (LEXAPRO) 20 MG tablet Take 1 tablet (20 mg total) by mouth daily. 01/05/14  Yes Morrell Riddle, PA-C  HYDROcodone-acetaminophen (VICODIN) 5-500 MG per tablet Take 1 tablet by mouth every 6 (six) hours as needed for pain (migraines).   Yes Historical Provider, MD  levonorgestrel (MIRENA) 20 MCG/24HR IUD 1 each by Intrauterine route once. 03/18/12 03/18/17 Yes Historical Provider, MD  LORazepam (ATIVAN) 0.5 MG tablet Take 0.5-1 tablets (0.25-0.5 mg total) by mouth 2 (two) times daily as needed for anxiety. 11/29/13  Yes Sarah Harvie Bridge, PA-C  minocycline (DYNACIN) 50 MG tablet Take 1 tablet (  50 mg total) by mouth 2 (two) times daily. 01/11/14  Yes Kelle DartingLayne C Weaver, NP  Multiple Vitamins-Minerals (MULTIVITAMIN WITH MINERALS) tablet Take 1 tablet by mouth daily.   Yes Historical Provider, MD  omeprazole (PRILOSEC) 40 MG capsule Take 1 capsule (40 mg total) by mouth 2 (two) times daily. 11/29/13  Yes Morrell RiddleSarah L Weber, PA-C  amoxicillin (AMOXIL) 500 MG capsule Take 1 capsule (500 mg total) by mouth 3 (three) times daily. 03/14/14   Elson AreasLeslie K Korah Hufstedler, PA-C   BP 118/80 mmHg  Pulse 76  Temp(Src) 98.3 F (36.8 C) (Oral)  Wt 170 lb (77.111 kg)  SpO2 99% Physical Exam  Constitutional: She is oriented to person, place, and time. She  appears well-developed and well-nourished.  HENT:  Head: Normocephalic and atraumatic.  Mouth/Throat: Posterior oropharyngeal erythema present.  Tender maxillary sinuses,  Eyes: Conjunctivae and EOM are normal. Pupils are equal, round, and reactive to light.  Neck: Normal range of motion. Neck supple.  Cardiovascular: Normal rate.   Pulmonary/Chest: Effort normal and breath sounds normal.  Abdominal: Soft.  Musculoskeletal: Normal range of motion.  Neurological: She is alert and oriented to person, place, and time.  Skin: Skin is warm and dry.  Psychiatric: She has a normal mood and affect.  Vitals reviewed.   ED Course  Procedures (including critical care time) Labs Review Labs Reviewed - No data to display  Imaging Review No results found.   MDM   1. Acute ethmoidal sinusitis, recurrence not specified     Amoxicillin AVS See your Physician for recheck in 1 week if symptoms persist  Elson AreasLeslie K Koralee Wedeking, PA-C 03/14/14 1505

## 2014-03-14 NOTE — ED Notes (Signed)
Patient c/o cough, congestion, sinus and ear pain x 1 week

## 2014-03-14 NOTE — Discharge Instructions (Signed)

## 2014-04-07 ENCOUNTER — Other Ambulatory Visit: Payer: Self-pay | Admitting: Physician Assistant

## 2014-04-13 ENCOUNTER — Ambulatory Visit: Payer: Self-pay | Admitting: Physician Assistant

## 2014-04-20 ENCOUNTER — Encounter: Payer: Self-pay | Admitting: Physician Assistant

## 2014-04-20 ENCOUNTER — Ambulatory Visit (INDEPENDENT_AMBULATORY_CARE_PROVIDER_SITE_OTHER): Payer: 59 | Admitting: Physician Assistant

## 2014-04-20 VITALS — BP 107/66 | HR 82 | Ht 59.5 in | Wt 181.0 lb

## 2014-04-20 DIAGNOSIS — K219 Gastro-esophageal reflux disease without esophagitis: Secondary | ICD-10-CM

## 2014-04-20 DIAGNOSIS — F508 Other eating disorders: Secondary | ICD-10-CM

## 2014-04-20 DIAGNOSIS — F5081 Binge eating disorder: Secondary | ICD-10-CM

## 2014-04-20 DIAGNOSIS — Z8659 Personal history of other mental and behavioral disorders: Secondary | ICD-10-CM

## 2014-04-20 DIAGNOSIS — Z6379 Other stressful life events affecting family and household: Secondary | ICD-10-CM

## 2014-04-20 MED ORDER — VILAZODONE HCL 10 MG PO TABS: ORAL_TABLET | ORAL | Status: DC

## 2014-04-20 MED ORDER — ESOMEPRAZOLE MAGNESIUM 40 MG PO CPDR: 40.0000 mg | DELAYED_RELEASE_CAPSULE | Freq: Every day | ORAL | Status: DC
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
## 2014-04-21 ENCOUNTER — Other Ambulatory Visit: Payer: Self-pay | Admitting: *Deleted

## 2014-04-21 ENCOUNTER — Telehealth: Payer: Self-pay | Admitting: *Deleted

## 2014-04-21 DIAGNOSIS — K219 Gastro-esophageal reflux disease without esophagitis: Secondary | ICD-10-CM | POA: Insufficient documentation

## 2014-04-21 DIAGNOSIS — F50819 Binge eating disorder, unspecified: Secondary | ICD-10-CM | POA: Insufficient documentation

## 2014-04-21 DIAGNOSIS — F5081 Binge eating disorder: Secondary | ICD-10-CM | POA: Insufficient documentation

## 2014-04-21 MED ORDER — PANTOPRAZOLE SODIUM 40 MG PO TBEC: 40.0000 mg | DELAYED_RELEASE_TABLET | Freq: Every day | ORAL | Status: DC

## 2014-04-21 NOTE — Telephone Encounter (Signed)
Pt left vm asking if we could change the nexium to 40mg  protonix.  [facility]

## 2014-04-23 ENCOUNTER — Encounter: Payer: Self-pay | Admitting: Nurse Practitioner

## 2014-04-23 ENCOUNTER — Ambulatory Visit (INDEPENDENT_AMBULATORY_CARE_PROVIDER_SITE_OTHER): Payer: 59 | Admitting: Nurse Practitioner

## 2014-04-23 ENCOUNTER — Ambulatory Visit: Payer: Self-pay | Admitting: Nurse Practitioner

## 2014-04-23 VITALS — BP 103/68 | HR 64 | Temp 98.3°F | Ht 59.5 in

## 2014-04-23 DIAGNOSIS — J069 Acute upper respiratory infection, unspecified: Secondary | ICD-10-CM

## 2014-04-23 DIAGNOSIS — J029 Acute pharyngitis, unspecified: Secondary | ICD-10-CM

## 2014-04-23 LAB — POCT RAPID STREP A (OFFICE): Rapid Strep A Screen: NEGATIVE

## 2014-04-23 MED ORDER — BENZONATATE 200 MG PO CAPS: 200.0000 mg | ORAL_CAPSULE | Freq: Three times a day (TID) | ORAL | Status: DC | PRN

## 2014-04-23 NOTE — Progress Notes (Signed)
Pre visit review using our clinic review tool, if applicable. No additional management support is needed unless otherwise documented below in the visit note. 

## 2014-04-23 NOTE — Progress Notes (Signed)
   Subjective:    Patient ID: [name] [name], female    DOB: 25-Feb-1980, 34 y.o.   MRN: [id number]  Cough This is a new problem. The current episode started in the past 7 days. The problem has been gradually worsening. The cough is non-productive. Associated symptoms include ear congestion, headaches, nasal congestion, postnasal drip and a sore throat. Pertinent negatives include no chest pain, chills, ear pain, fever, myalgias, shortness of breath or wheezing. The symptoms are aggravated by lying down (wakes w/cough). Treatments tried: nsaid, sudafed. The treatment provided mild relief.      Review of Systems  Constitutional: Negative for fever and chills.  HENT: Positive for postnasal drip and sore throat. Negative for ear pain.   Respiratory: Positive for cough. Negative for shortness of breath and wheezing.   Cardiovascular: Negative for chest pain.  Musculoskeletal: Negative for myalgias.  Neurological: Positive for headaches.       Objective:   Physical Exam  Constitutional: She is oriented to person, place, and time. She appears well-developed and well-nourished.  HENT:  Head: Normocephalic and atraumatic.  Right Ear: External ear normal.  Left Ear: External ear normal.  Mouth/Throat: No oropharyngeal exudate.  Posterior pharynx mildly red Cloudy fluid bilat TM, bones visible.  Eyes: Conjunctivae are normal. Right eye exhibits no discharge. Left eye exhibits no discharge.  Neck: Normal range of motion. Neck supple. No thyromegaly present.  Cardiovascular: Normal rate, regular rhythm and normal heart sounds.   No murmur heard. Pulmonary/Chest: Effort normal and breath sounds normal. No respiratory distress. She has no wheezes. She has no rales.  Lymphadenopathy:    She has no cervical adenopathy.  Neurological: She is alert and oriented to person, place, and time.  Skin: Skin is warm and dry.  Psychiatric: She has a normal mood and affect. Her behavior is normal. Thought  content normal.  Vitals reviewed.         Assessment & Plan:  1. Sore throat Likely viral - POCT rapid strep A-Neg - Upper Respiratory Culture 2. Upper respiratory infection with cough and congestion Symptom managemet - benzonatate (TESSALON) 200 MG capsule; Take 1 capsule (200 mg total) by mouth 3 (three) times daily as needed for cough.  Dispense: 60 capsule; Refill: 0 Daily sinus rinse Pseudoephedrine See pt instructions F/u PRN

## 2014-04-23 NOTE — Patient Instructions (Signed)
This is likely a viral sore throat/upper respiratory infection. However, if your culture comes back growing bacteria, I will call in an antibiotic.  In the meantime, you may use benzocaine throat lozenges or throat spray for comfort.  For runny nose, start Neilmed sinus rinses daily. Use twice daily for about 1 week.  Continue 30 mg pseudoephedrine 2 to 3 times daily.  Ibuprophen OK for HA. Vicks under nose at night. Try tessalon pearles.   Rest, sip fluids every hour.  Feel better!  Sore Throat A sore throat is pain, burning, irritation, or scratchiness of the throat. There is often pain or tenderness when swallowing or talking. A sore throat may be accompanied by other symptoms, such as coughing, sneezing, fever, and swollen neck glands. A sore throat is often the first sign of another sickness, such as a cold, flu, strep throat, or mononucleosis (commonly known as mono). Most sore throats go away without medical treatment. CAUSES  The most common causes of a sore throat include:  A viral infection, such as a cold, flu, or mono.  A bacterial infection, such as strep throat, tonsillitis, or whooping cough.  Seasonal allergies.  Dryness in the air.  Irritants, such as smoke or pollution.  Gastroesophageal reflux disease (GERD). HOME CARE INSTRUCTIONS   Only take over-the-counter medicines as directed by your caregiver.  Drink enough fluids to keep your urine clear or pale yellow.  Rest as needed.  Try using throat sprays, lozenges, or sucking on hard candy to ease any pain (if older than 4 years or as directed).  Sip warm liquids, such as broth, herbal tea, or warm water with honey to relieve pain temporarily. You may also eat or drink cold or frozen liquids such as frozen ice pops.  Gargle with salt water (mix 1 tsp salt with 8 oz of water).  Do not smoke and avoid secondhand smoke.  Put a cool-mist humidifier in your bedroom at night to moisten the air. You can also turn on  a hot shower and sit in the bathroom with the door closed for 5 10 minutes. SEEK IMMEDIATE MEDICAL CARE IF:  You have difficulty breathing.  You are unable to swallow fluids, soft foods, or your saliva.  You have increased swelling in the throat.  Your sore throat does not get better in 7 days.  You have nausea and vomiting.  You have a fever or persistent symptoms for more than 2 3 days.  You have a fever and your symptoms suddenly get worse. MAKE SURE YOU:   Understand these instructions.  Will watch your condition.  Will get help right away if you are not doing well or get worse. Document Released: 03/15/2004 Document Revised: 01/23/2012 Document Reviewed: 10/14/2011 [facility] Patient Information 2014 [place], [place].

## 2014-04-26 ENCOUNTER — Ambulatory Visit (INDEPENDENT_AMBULATORY_CARE_PROVIDER_SITE_OTHER): Payer: 59 | Admitting: Physician Assistant

## 2014-04-26 ENCOUNTER — Encounter: Payer: Self-pay | Admitting: Physician Assistant

## 2014-04-26 VITALS — BP 132/79 | HR 70 | Ht 59.5 in | Wt 182.0 lb

## 2014-04-26 DIAGNOSIS — J069 Acute upper respiratory infection, unspecified: Secondary | ICD-10-CM

## 2014-04-26 DIAGNOSIS — H6691 Otitis media, unspecified, right ear: Secondary | ICD-10-CM

## 2014-04-26 LAB — CULTURE, UPPER RESPIRATORY: ORGANISM ID, BACTERIA: NORMAL

## 2014-04-26 MED ORDER — FLUCONAZOLE 150 MG PO TABS: 150.0000 mg | ORAL_TABLET | Freq: Once | ORAL | Status: DC

## 2014-04-26 MED ORDER — AMOXICILLIN 875 MG PO TABS: 875.0000 mg | ORAL_TABLET | Freq: Two times a day (BID) | ORAL | Status: DC

## 2014-04-26 NOTE — Progress Notes (Signed)
   Subjective:    Patient ID: [name] [name], female    DOB: 29-Jun-1980, 34 y.o.   MRN: [id number]  HPI Pt presents to the clinic with right ear pain, ST, nasal congestion, non-productive cough. She went to her workplace NP on 04/23/14 and was dx with viral syndrome. Rapid strep and culture negative. She has used sudafed, nettie pot, salt water gargles with little to no relief. She feels like right ear is about to explode.    Review of Systems  All other systems reviewed and are negative.      Objective:   Physical Exam  Constitutional: She is oriented to person, place, and time. She appears well-developed and well-nourished.  HENT:  Head: Normocephalic and atraumatic.  Left Ear: External ear normal.  Nose: Nose normal.  Mouth/Throat: Oropharynx is clear and moist. No oropharyngeal exudate.  Right external ear erythematous. TM dull with no light reflex. Not able to view ossicles.   Negative for maxillary sinus tenderness to palpation.    Eyes: Conjunctivae are normal. Right eye exhibits no discharge. Left eye exhibits no discharge.  Neck: Normal range of motion. Neck supple.  Cardiovascular: Normal rate, regular rhythm and normal heart sounds.   Pulmonary/Chest: Effort normal and breath sounds normal. She has no wheezes.  Lymphadenopathy:    She has no cervical adenopathy.  Neurological: She is alert and oriented to person, place, and time.  Skin: Skin is dry.  Psychiatric: She has a normal mood and affect. Her behavior is normal.          Assessment & Plan:  Right otitis media/acute upper respiratory infection- treated with amoxillicin for 10 days. Continue sudafed and other symptomatic care. Follow up if not improving.

## 2014-05-03 ENCOUNTER — Encounter: Payer: Self-pay | Admitting: Physician Assistant

## 2014-05-04 ENCOUNTER — Encounter: Payer: Self-pay | Admitting: Physician Assistant

## 2014-05-05 ENCOUNTER — Other Ambulatory Visit: Payer: Self-pay | Admitting: Physician Assistant

## 2014-05-05 DIAGNOSIS — F5081 Binge eating disorder: Secondary | ICD-10-CM

## 2014-05-12 ENCOUNTER — Telehealth: Payer: Self-pay | Admitting: Physician Assistant

## 2014-05-12 NOTE — Telephone Encounter (Signed)
Pt called to check the status of her Behavioral Health referral, but i do not see where one was entered as a referral and I called downstairs to Behavioral Health to make sure they did not see it. Thanks

## 2014-05-12 NOTE — Telephone Encounter (Signed)
[name] HashimotoPatricia,  I personally spoke with you about this referral can you follow up with patient.

## 2014-05-12 NOTE — Telephone Encounter (Signed)
Jade, you did speak with me about referral and I've been expecting it ever since but I never got it. In fact I checked with Amber the very next day but she did not know anything about it. I did see a referral come up for Bariatric so I thought that's where you decided to sent patient and that's why I didn't say anything more to you. I am sorry about the confusion.

## 2014-05-12 NOTE — Telephone Encounter (Signed)
It is in the EMR. I will print out for you slip today. And discuss with patient the hold up. Referral placed 05/05/14.

## 2014-05-21 ENCOUNTER — Other Ambulatory Visit: Payer: Self-pay | Admitting: *Deleted

## 2014-05-25 ENCOUNTER — Ambulatory Visit: Payer: Self-pay | Admitting: Physician Assistant

## 2014-05-28 ENCOUNTER — Encounter: Payer: Self-pay | Admitting: Family Medicine

## 2014-05-28 ENCOUNTER — Ambulatory Visit (INDEPENDENT_AMBULATORY_CARE_PROVIDER_SITE_OTHER): Payer: 59 | Admitting: Family Medicine

## 2014-05-28 VITALS — BP 110/60 | HR 100 | Temp 98.5°F | Ht 59.5 in

## 2014-05-28 DIAGNOSIS — J209 Acute bronchitis, unspecified: Secondary | ICD-10-CM | POA: Diagnosis not present

## 2014-05-28 MED ORDER — ALBUTEROL SULFATE HFA 108 (90 BASE) MCG/ACT IN AERS: 1.0000 | INHALATION_SPRAY | RESPIRATORY_TRACT | Status: DC | PRN

## 2014-05-28 MED ORDER — METHYLPREDNISOLONE ACETATE 40 MG/ML IJ SUSP
40.0000 mg | Freq: Once | INTRAMUSCULAR | Status: AC
Start: 2014-05-28 — End: 2014-05-28
  Administered 2014-05-28: 40 mg via INTRA_ARTICULAR

## 2014-05-28 MED ORDER — IPRATROPIUM-ALBUTEROL 0.5-2.5 (3) MG/3ML IN SOLN
3.0000 mL | Freq: Once | RESPIRATORY_TRACT | Status: AC
  Administered 2014-05-28: 3 mL via RESPIRATORY_TRACT

## 2014-05-28 NOTE — Progress Notes (Addendum)
OFFICE NOTE  05/28/2014  CC:  Chief Complaint  Patient presents with  . Cough     HPI: Patient is a 34 y.o. Caucasian female who is here for 3d hx of nasal congestion/runny nose, some mild ST/facial pressure that has been responding well to neti pot use.  In last day or two she has had onset of cough, dry, worse hs, without wheezing or SOB or fever.  No chest tightness.  Pertinent PMH:  Past medical, surgical, social, and family history reviewed and no changes are noted since last office visit.  MEDS:  Cough drops and neti pot  PE: Blood pressure 110/60, pulse 100, temperature 98.5 F (36.9 C), temperature source Oral, height 4' 11.5" (1.511 m), SpO2 98 %. VS: noted--normal. Gen: alert, NAD, NONTOXIC APPEARING. HEENT: eyes without injection, drainage, or swelling.  Ears: EACs clear, TMs with normal light reflex and landmarks.  Nose: Clear rhinorrhea, with some dried, crusty exudate adherent to mildly injected mucosa.  No purulent d/c.  No paranasal sinus TTP.  No facial swelling.  Throat and mouth without focal lesion.  No pharyngial swelling, erythema, or exudate.   Neck: supple, no LAD.   LUNGS: CTA bilat, nonlabored resps.  Lots of post-exhalation coughing.  CV: RRR, no m/r/g. EXT: no c/c/e SKIN: no rash    IMPRESSION AND PLAN:  URI with acute bronchitis, suspect viral etiology. Pt improved with alb/atr neb in office today. Ventolin HFA 1-2 puffs q4h prn rx'd today. Depo-medrol 40mg  IM given today. Trial of mucinex DM or robitussin DM otc as directed on the box. May use OTC nasal saline spray or irrigation solution bid.  An After Visit Summary was printed and given to the patient.  FOLLOW UP: prn

## 2014-05-28 NOTE — Progress Notes (Signed)
Pre visit review using our clinic review tool, if applicable. No additional management support is needed unless otherwise documented below in the visit note. 

## 2014-05-28 NOTE — Addendum Note (Signed)
Addended by: Jeoffrey MassedMCGOWEN, PHILIP H on: 05/28/2014 10:13 AM   Modules accepted: Orders

## 2014-06-11 ENCOUNTER — Encounter: Payer: Self-pay | Admitting: Family Medicine

## 2014-06-11 ENCOUNTER — Ambulatory Visit (INDEPENDENT_AMBULATORY_CARE_PROVIDER_SITE_OTHER): Payer: 59 | Admitting: Family Medicine

## 2014-06-11 VITALS — BP 113/81 | HR 116 | Temp 97.9°F

## 2014-06-11 DIAGNOSIS — J208 Acute bronchitis due to other specified organisms: Secondary | ICD-10-CM

## 2014-06-11 MED ORDER — PREDNISONE 20 MG PO TABS: ORAL_TABLET | ORAL | Status: DC

## 2014-06-11 NOTE — Progress Notes (Signed)
OFFICE NOTE  06/11/2014  CC:  Chief Complaint  Patient presents with  . Cough     HPI: Patient is a 34 y.o. Caucasian female who is here for ongoing cough, dry, some chest tightness, some tickle in back of throat.  No signif nasal congestion or runny nose at this point.  No fevers, no SOB, no audible wheezing but she has responded favorably to albuterol neb in office and inhaler at home prn.   Pertinent PMH:  Past medical, surgical, social, and family history reviewed and no changes are noted since last office visit.  MEDS: Pt reports currently taking only albuterol prn, tessalon prn, mucinex prn, and pantoprazole qd Outpatient Prescriptions Prior to Visit  Medication Sig Dispense Refill  . albuterol (VENTOLIN HFA) 108 (90 BASE) MCG/ACT inhaler Inhale 1-2 puffs into the lungs every 4 (four) hours as needed for wheezing or shortness of breath. 1 Inhaler 1  . benzonatate (TESSALON) 200 MG capsule Take 1 capsule (200 mg total) by mouth 3 (three) times daily as needed for cough. 60 capsule 0  . HYDROcodone-acetaminophen (VICODIN) 5-500 MG per tablet Take 1 tablet by mouth every 6 (six) hours as needed for pain (migraines).    . LORazepam (ATIVAN) 0.5 MG tablet Take 0.5-1 tablets (0.25-0.5 mg total) by mouth 2 (two) times daily as needed for anxiety. 30 tablet 0  . pantoprazole (PROTONIX) 40 MG tablet Take 1 tablet (40 mg total) by mouth daily. 90 tablet 1  . fluconazole (DIFLUCAN) 150 MG tablet Take 1 tablet (150 mg total) by mouth once. If symptoms persist past 48-72 hours take another tablet. 2 tablet 0  . Multiple Vitamins-Minerals (MULTIVITAMIN WITH MINERALS) tablet Take 1 tablet by mouth daily.    Marland Kitchen. amoxicillin (AMOXIL) 875 MG tablet Take 1 tablet (875 mg total) by mouth 2 (two) times daily. For 10 days. 20 tablet 0  . Vilazodone HCl (VIIBRYD) 10 MG TABS Take 1 tablet for 7 days then increase to 2 tablets daily. 60 tablet 1   No facility-administered medications prior to visit.     PE: Blood pressure 113/81, pulse 116, temperature 97.9 F (36.6 C), temperature source Oral, SpO2 96 %. VS: noted--normal. Gen: alert, NAD, WELL- APPEARING. HEENT: eyes without injection, drainage, or swelling.  Ears: EACs clear, TMs with normal light reflex and landmarks.  Nose: Clear rhinorrhea, with some dried, crusty exudate adherent to mildly injected mucosa.  No purulent d/c.  No paranasal sinus TTP.  No facial swelling.  Throat and mouth without focal lesion.  No pharyngial swelling, erythema, or exudate.   Neck: supple, no LAD.   LUNGS: CTA bilat, nonlabored resps.  Some post-exhalation coughing noted. CV: RRR, no m/r/g. EXT: no c/c/e SKIN: no rash  IMPRESSION AND PLAN:  Acute bronchitis, now with sx's >2 wks. Will try prednisone 40mg  qd x 5d. Continue all other current meds.  An After Visit Summary was printed and given to the patient.  FOLLOW UP: prn

## 2014-06-11 NOTE — Progress Notes (Signed)
Pre visit review using our clinic review tool, if applicable. No additional management support is needed unless otherwise documented below in the visit note. 

## 2014-06-22 ENCOUNTER — Ambulatory Visit (INDEPENDENT_AMBULATORY_CARE_PROVIDER_SITE_OTHER): Payer: 59 | Admitting: Physician Assistant

## 2014-06-22 ENCOUNTER — Encounter (HOSPITAL_COMMUNITY): Payer: Self-pay | Admitting: Physician Assistant

## 2014-06-22 VITALS — BP 118/70 | HR 83 | Ht 59.5 in | Wt 170.0 lb

## 2014-06-22 DIAGNOSIS — F4310 Post-traumatic stress disorder, unspecified: Secondary | ICD-10-CM

## 2014-06-22 DIAGNOSIS — F411 Generalized anxiety disorder: Secondary | ICD-10-CM

## 2014-06-22 DIAGNOSIS — G43009 Migraine without aura, not intractable, without status migrainosus: Secondary | ICD-10-CM

## 2014-06-22 MED ORDER — BUSPIRONE HCL 5 MG PO TABS: 5.0000 mg | ORAL_TABLET | Freq: Two times a day (BID) | ORAL | Status: DC

## 2014-06-22 NOTE — Patient Instructions (Signed)
1. Take all of your medications as discussed with your provider. (Please check your AVS, for the list.) 2. Call this office for any questions or problems. 3. Be sure to get plenty of rest and try for 7-9 hours of quality sleep each night. 4. Try to get regular exercise, at least 15-30 minutes each day.  A good walk will help tremendously! 5. Remember to do your mindfulness each day, breath deeply in and out, while having quiet reflection, prayer, meditation, or positive visualization. Unplug and turn off all electronic devices each day for your own personal time without interruption. This works! There are studies to back this up! 6. Be sure to take your B complex and Vitamin D3 each day. This will improve your overall wellbeing and boost your immune system as well. 7. Try to eat a nutritious healthy diet and avoid excessive alcohol and ALL tobacco products. 8. Be sure to keep all of your appointments with your outpatient therapist. If you do not have one, our office will be happy to assist you with this. 9. Be sure to keep your next follow up appointment in 3 weeks. 
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
## 2014-06-25 ENCOUNTER — Emergency Department (HOSPITAL_BASED_OUTPATIENT_CLINIC_OR_DEPARTMENT_OTHER): Payer: PRIVATE HEALTH INSURANCE

## 2014-06-25 ENCOUNTER — Encounter (HOSPITAL_BASED_OUTPATIENT_CLINIC_OR_DEPARTMENT_OTHER): Payer: Self-pay

## 2014-06-25 ENCOUNTER — Emergency Department (HOSPITAL_BASED_OUTPATIENT_CLINIC_OR_DEPARTMENT_OTHER)
Admission: EM | Admit: 2014-06-25 | Discharge: 2014-06-25 | Disposition: A | Discharge status: Home / Self Care | Payer: PRIVATE HEALTH INSURANCE | Attending: Emergency Medicine | Admitting: Emergency Medicine

## 2014-06-25 DIAGNOSIS — Z8659 Personal history of other mental and behavioral disorders: Secondary | ICD-10-CM | POA: Diagnosis not present

## 2014-06-25 DIAGNOSIS — Y9289 Other specified places as the place of occurrence of the external cause: Secondary | ICD-10-CM | POA: Diagnosis not present

## 2014-06-25 DIAGNOSIS — Z8619 Personal history of other infectious and parasitic diseases: Secondary | ICD-10-CM | POA: Insufficient documentation

## 2014-06-25 DIAGNOSIS — Z8679 Personal history of other diseases of the circulatory system: Secondary | ICD-10-CM | POA: Diagnosis not present

## 2014-06-25 DIAGNOSIS — X58XXXA Exposure to other specified factors, initial encounter: Secondary | ICD-10-CM | POA: Diagnosis not present

## 2014-06-25 DIAGNOSIS — S99912A Unspecified injury of left ankle, initial encounter: Secondary | ICD-10-CM | POA: Insufficient documentation

## 2014-06-25 DIAGNOSIS — Z79899 Other long term (current) drug therapy: Secondary | ICD-10-CM | POA: Insufficient documentation

## 2014-06-25 DIAGNOSIS — Y9389 Activity, other specified: Secondary | ICD-10-CM | POA: Insufficient documentation

## 2014-06-25 DIAGNOSIS — S99922A Unspecified injury of left foot, initial encounter: Secondary | ICD-10-CM | POA: Insufficient documentation

## 2014-06-25 DIAGNOSIS — Y990 Civilian activity done for income or pay: Secondary | ICD-10-CM | POA: Diagnosis not present

## 2014-06-25 MED ORDER — IBUPROFEN 800 MG PO TABS: 800.0000 mg | ORAL_TABLET | Freq: Three times a day (TID) | ORAL | Status: DC | PRN

## 2014-06-25 MED ORDER — HYDROCODONE-ACETAMINOPHEN 5-325 MG PO TABS: 1.0000 | ORAL_TABLET | ORAL | Status: DC | PRN

## 2014-06-25 MED ORDER — IBUPROFEN 800 MG PO TABS
800.0000 mg | ORAL_TABLET | Freq: Once | ORAL | Status: AC
  Administered 2014-06-25: 800 mg via ORAL
  Filled 2014-06-25: qty 1

## 2014-06-25 NOTE — Discharge Instructions (Signed)
Foot/Ankle Sprain An ankle sprain is an injury to the strong, fibrous tissues (ligaments) that hold the bones of your ankle joint together.  CAUSES An ankle sprain is usually caused by a fall or by twisting your ankle. Ankle sprains most commonly occur when you step on the outer edge of your foot, and your ankle turns inward. People who participate in sports are more prone to these types of injuries.  SYMPTOMS   Pain in your ankle. The pain may be present at rest or only when you are trying to stand or walk.  Swelling.  Bruising. Bruising may develop immediately or within 1 to 2 days after your injury.  Difficulty standing or walking, particularly when turning corners or changing directions. DIAGNOSIS  Your caregiver will ask you details about your injury and perform a physical exam of your ankle to determine if you have an ankle sprain. During the physical exam, your caregiver will press on and apply pressure to specific areas of your foot and ankle. Your caregiver will try to move your ankle in certain ways. An X-ray exam may be done to be sure a bone was not broken or a ligament did not separate from one of the bones in your ankle (avulsion fracture).  TREATMENT  Certain types of braces can help stabilize your ankle. Your caregiver can make a recommendation for this. Your caregiver may recommend the use of medicine for pain. If your sprain is severe, your caregiver may refer you to a surgeon who helps to restore function to parts of your skeletal system (orthopedist) or a physical therapist. HOME CARE INSTRUCTIONS   Apply ice to your injury for 1-2 days or as directed by your caregiver. Applying ice helps to reduce inflammation and pain.  Put ice in a plastic bag.  Place a towel between your skin and the bag.  Leave the ice on for 15-20 minutes at a time, every 2 hours while you are awake.  Only take over-the-counter or prescription medicines for pain, discomfort, or fever as directed  by your caregiver.  Elevate your injured ankle above the level of your heart as much as possible for 2-3 days.  If your caregiver recommends crutches, use them as instructed. Gradually put weight on the affected ankle. Continue to use crutches or a cane until you can walk without feeling pain in your ankle.  If you have a plaster splint, wear the splint as directed by your caregiver. Do not rest it on anything harder than a pillow for the first 24 hours. Do not put weight on it. Do not get it wet. You may take it off to take a shower or bath.  You may have been given an elastic bandage to wear around your ankle to provide support. If the elastic bandage is too tight (you have numbness or tingling in your foot or your foot becomes cold and blue), adjust the bandage to make it comfortable.  If you have an air splint, you may blow more air into it or let air out to make it more comfortable. You may take your splint off at night and before taking a shower or bath. Wiggle your toes in the splint several times per day to decrease swelling. SEEK MEDICAL CARE IF:   You have rapidly increasing bruising or swelling.  Your toes feel extremely cold or you lose feeling in your foot.  Your pain is not relieved with medicine. SEEK IMMEDIATE MEDICAL CARE IF:  Your toes are numb or blue.  You have severe pain that is increasing. MAKE SURE YOU:   Understand these instructions.  Will watch your condition.  Will get help right away if you are not doing well or get worse. Document Released: 02/05/2005 Document Revised: 10/31/2011 Document Reviewed: 02/17/2011 [facility] Patient Information 2015 [place], [place]. This information is not intended to replace advice given to you by your health care provider. Make sure you discuss any questions you have with your health care provider.   RICE: Routine Care for Injuries The routine care of many injuries includes Rest, Ice, Compression, and Elevation  (RICE). HOME CARE INSTRUCTIONS  Rest is needed to allow your body to heal. Routine activities can usually be resumed when comfortable. Injured tendons and bones can take up to 6 weeks to heal. Tendons are the cord-like structures that attach muscle to bone.  Ice following an injury helps keep the swelling down and reduces pain.  Put ice in a plastic bag.  Place a towel between your skin and the bag.  Leave the ice on for 15-20 minutes, 3-4 times a day, or as directed by your health care provider. Do this while awake, for the first 24 to 48 hours. After that, continue as directed by your caregiver.  Compression helps keep swelling down. It also gives support and helps with discomfort. If an elastic bandage has been applied, it should be removed and reapplied every 3 to 4 hours. It should not be applied tightly, but firmly enough to keep swelling down. Watch fingers or toes for swelling, bluish discoloration, coldness, numbness, or excessive pain. If any of these problems occur, remove the bandage and reapply loosely. Contact your caregiver if these problems continue.  Elevation helps reduce swelling and decreases pain. With extremities, such as the arms, hands, legs, and feet, the injured area should be placed near or above the level of the heart, if possible. SEEK IMMEDIATE MEDICAL CARE IF:  You have persistent pain and swelling.  You develop redness, numbness, or unexpected weakness.  Your symptoms are getting worse rather than improving after several days. These symptoms may indicate that further evaluation or further X-rays are needed. Sometimes, X-rays may not show a small broken bone (fracture) until 1 week or 10 days later. Make a follow-up appointment with your caregiver. Ask when your X-ray results will be ready. Make sure you get your X-ray results. Document Released: 05/20/2000 Document Revised: 02/10/2013 Document Reviewed: 07/07/2010 [facility] Patient Information 2015 [place],  [place]. This information is not intended to replace advice given to you by your health care provider. Make sure you discuss any questions you have with your health care provider.

## 2014-06-25 NOTE — ED Notes (Signed)
MD at bedside. 

## 2014-06-25 NOTE — ED Notes (Signed)
Injury to left foot sustained after twisted ankle while at work yesterday.  States she heard a pop and pain increases with weight bearing.  No previous injury.

## 2014-06-25 NOTE — ED Provider Notes (Signed)
TIME SEEN: 9:20 AM  CHIEF COMPLAINT: Left ankle and foot pain  HPI: Pt is a 34 y.o. female with history of migraines, depression who presents to the emergency department with left foot and ankle pain after injury yesterday. States that she inverted her ankle yesterday while at work and heard a pop is having pain to the lateral malleolus and lateral dorsal foot. Pain is worse with weightbearing. Did not fall and hit her head. No numbness, tingling or focal weakness.  ROS: See HPI Constitutional: no fever  Eyes: no drainage  ENT: no runny nose   Cardiovascular:  no chest pain  Resp: no SOB  GI: no vomiting GU: no dysuria Integumentary: no rash  Allergy: no hives  Musculoskeletal: no leg swelling  Neurological: no slurred speech ROS otherwise negative  PAST MEDICAL HISTORY/PAST SURGICAL HISTORY:  Past Medical History  Diagnosis Date  . Migraines     MIGRAINES;HAS TAKEN MAXALT AND VICODIN IN THE PAST OR CAFFEINE  . Depression     HAS TAKEN LEXAPRO 40MG  DAILY BEFORE PREGNANCY;WEANED SELF OFF  . Infection     YEAST INF;TYPICALLY AFTER ANTIBXS;NOT FREQ  . Abnormal Pap smear 2005    PRE-CANCEROUS CELLS ON CX;COLPO DONE AND CELLS "BURNED OFF";LAST PAP 2010 OR 2011,WAS NORMAL  . Anxiety   . GAD (generalized anxiety disorder) 06/22/2014  . PTSD (post-traumatic stress disorder) 06/22/2014    MEDICATIONS:  Prior to Admission medications   Medication Sig Start Date End Date Taking? Authorizing Provider  pantoprazole (PROTONIX) 40 MG tablet Take 1 tablet (40 mg total) by mouth daily. 04/21/14 04/21/15  Jomarie LongsJade L Breeback, PA-C    ALLERGIES:  Allergies  Allergen Reactions  . Wellbutrin [Bupropion]     Made pt angry.     SOCIAL HISTORY:  History  Substance Use Topics  . Smoking status: Never Smoker   . Smokeless tobacco: Never Used  . Alcohol Use: 0.6 oz/week    1 Standard drinks or equivalent per week     Comment: RARELY    FAMILY HISTORY: Family History  Problem Relation Age of  Onset  . Migraines Father     IN THE PAST  . Alzheimer's disease Father     DX'D @ 3560  . Migraines Paternal Grandfather   . Thyroid disease Mother   . Breast cancer Paternal Aunt   . Cancer Paternal Aunt   . Cancer Maternal Grandmother   . Breast cancer Maternal Grandmother     EXAM: BP 113/81 mmHg  Pulse 81  Temp(Src) 98.2 F (36.8 C) (Oral)  Resp 18  Ht 4' 11.5" (1.511 m)  Wt 170 lb (77.111 kg)  BMI 33.77 kg/m2  SpO2 100%  LMP 06/20/2014 CONSTITUTIONAL: Alert and oriented and responds appropriately to questions. Well-appearing; well-nourished HEAD: Normocephalic EYES: Conjunctivae clear, PERRL ENT: normal nose; no rhinorrhea; moist mucous membranes; pharynx without lesions noted NECK: Supple, no meningismus, no LAD  CARD: RRR; S1 and S2 appreciated; no murmurs, no clicks, no rubs, no gallops RESP: Normal chest excursion without splinting or tachypnea; breath sounds clear and equal bilaterally; no wheezes, no rhonchi, no rales, no hypoxia or respiratory distress, speaking full sentences ABD/GI: Normal bowel sounds; non-distended; soft, non-tender, no rebound, no guarding, no peritoneal signs BACK:  The back appears normal and is non-tender to palpation, there is no CVA tenderness EXT: Tender to palpation over the dorsal lateral foot without bony deformity, tender over the lateral malleolus with some swelling but no ecchymosis, no obvious bony deformity, 2+ DP pulses  bilaterally, sensation to light touch intact diffusely, no tenderness over the proximal fibular head, no calf tenderness, normal range of motion in the hip and knee on the left side, no ligamentous laxity of the left ankle, Normal ROM in all joints; otherwise externally is are non-tender to palpation; no edema; normal capillary refill; no cyanosis, no calf tenderness or swelling    SKIN: Normal color for age and race; warm NEURO: Moves all extremities equally, sensation to light touch intact diffusely, cranial nerves  II through XII intact PSYCH: The patient's mood and manner are appropriate. Grooming and personal hygiene are appropriate.  MEDICAL DECISION MAKING: Patient here with left foot and ankle injury. X-rays show no acute injury. We'll place an Ace wrap and provided crutches. We'll give orthopedic follow-up if symptoms continue. Discussed return precautions. Discussed rest, elevation and ice. Patient verbalizes understanding is comfortable with plan. We'll discharge with prescription for ibuprofen and Vicodin to use as needed.       Layla MawKristen N Ward, DO 06/25/14 [phone]

## 2014-06-29 ENCOUNTER — Encounter: Payer: Self-pay | Admitting: Family Medicine

## 2014-06-29 ENCOUNTER — Ambulatory Visit (INDEPENDENT_AMBULATORY_CARE_PROVIDER_SITE_OTHER): Payer: Worker's Compensation | Admitting: Family Medicine

## 2014-06-29 ENCOUNTER — Ambulatory Visit: Payer: Self-pay | Admitting: Family Medicine

## 2014-06-29 VITALS — BP 113/73 | HR 73 | Ht 60.0 in | Wt 170.0 lb

## 2014-06-29 DIAGNOSIS — S93402A Sprain of unspecified ligament of left ankle, initial encounter: Secondary | ICD-10-CM

## 2014-06-29 NOTE — Patient Instructions (Signed)
You have an ankle sprain. Ice the area for 15 minutes at a time, 3-4 times a day as needed. Ibuprofen 600 or 800mg  three times a day as needed with food. Elevate above the level of your heart when possible for swelling. Use laceup ankle brace to help with stability while you recover from this injury - usually for total of 6 weeks. Start theraband strengthening exercises when directed - once a day 3 sets of 10 for next 6 weeks. Consider physical therapy for strengthening and balance exercises in future. If not improving as expected, we may repeat x-rays or consider further testing like an MRI. Follow up with me in 6 weeks or as needed.

## 2014-07-01 DIAGNOSIS — S93402A Sprain of unspecified ligament of left ankle, initial encounter: Secondary | ICD-10-CM | POA: Insufficient documentation
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
## 2014-07-01 NOTE — Assessment & Plan Note (Signed)
icing, nsaids.  ASO for stability.  Shown home exercises to do daily.  Consider formal physical therapy.  F/u in 6 weeks or prn.

## 2014-07-14 ENCOUNTER — Ambulatory Visit (HOSPITAL_COMMUNITY): Payer: 59 | Admitting: Physician Assistant

## 2014-07-14 VITALS — BP 118/72 | HR 63 | Ht 60.0 in | Wt 170.0 lb

## 2014-07-22 NOTE — Progress Notes (Signed)
Rescheduled

## 2014-07-28 ENCOUNTER — Ambulatory Visit (HOSPITAL_COMMUNITY): Payer: 59 | Admitting: Physician Assistant

## 2014-08-18 ENCOUNTER — Encounter: Payer: Self-pay | Admitting: Physician Assistant

## 2014-08-31 LAB — OB RESULTS CONSOLE HEPATITIS B SURFACE ANTIGEN: HEP B S AG: NEGATIVE

## 2014-08-31 LAB — OB RESULTS CONSOLE RUBELLA ANTIBODY, IGM: RUBELLA: IMMUNE

## 2014-09-16 ENCOUNTER — Encounter (HOSPITAL_COMMUNITY): Payer: Self-pay | Admitting: Anesthesiology

## 2014-09-16 ENCOUNTER — Inpatient Hospital Stay (HOSPITAL_COMMUNITY)
Admission: AD | Admit: 2014-09-16 | Discharge: 2014-09-16 | Disposition: A | Discharge status: Home / Self Care | Payer: 59 | Source: Ambulatory Visit | Attending: Obstetrics and Gynecology | Admitting: Obstetrics and Gynecology

## 2014-09-16 ENCOUNTER — Encounter (HOSPITAL_COMMUNITY): Payer: Self-pay | Admitting: *Deleted

## 2014-09-16 DIAGNOSIS — O210 Mild hyperemesis gravidarum: Secondary | ICD-10-CM | POA: Insufficient documentation

## 2014-09-16 DIAGNOSIS — Z3A10 10 weeks gestation of pregnancy: Secondary | ICD-10-CM | POA: Insufficient documentation

## 2014-09-16 DIAGNOSIS — O219 Vomiting of pregnancy, unspecified: Secondary | ICD-10-CM

## 2014-09-16 LAB — URINALYSIS, ROUTINE W REFLEX MICROSCOPIC
Glucose, UA: NEGATIVE mg/dL
Ketones, ur: >80 mg/dL — AB
Leukocytes, UA: NEGATIVE
Nitrite: NEGATIVE
Protein, ur: NEGATIVE mg/dL
Specific Gravity, Urine: >1.03 — ABNORMAL HIGH (ref 1.005–1.030)
UROBILINOGEN UA: 1 mg/dL (ref 0.0–1.0)
pH: 5.5 (ref 5.0–8.0)

## 2014-09-16 LAB — URINE MICROSCOPIC-ADD ON

## 2014-09-16 LAB — CBC WITH DIFFERENTIAL/PLATELET
BASOS PCT: 0 % (ref 0–1)
Basophils Absolute: 0 10*3/uL (ref 0.0–0.1)
Eosinophils Absolute: 0 10*3/uL (ref 0.0–0.7)
Eosinophils Relative: 0 % (ref 0–5)
HEMATOCRIT: 38.4 % (ref 36.0–46.0)
Hemoglobin: 13.4 g/dL (ref 12.0–15.0)
LYMPHS ABS: 1 10*3/uL (ref 0.7–4.0)
Lymphocytes Relative: 13 % (ref 12–46)
MCH: 30.9 pg (ref 26.0–34.0)
MCHC: 34.9 g/dL (ref 30.0–36.0)
MCV: 88.5 fL (ref 78.0–100.0)
MONOS PCT: 7 % (ref 3–12)
Monocytes Absolute: 0.5 10*3/uL (ref 0.1–1.0)
NEUTROS PCT: 80 % — AB (ref 43–77)
Neutro Abs: 6 10*3/uL (ref 1.7–7.7)
Platelets: 171 10*3/uL (ref 150–400)
RBC: 4.34 MIL/uL (ref 3.87–5.11)
RDW: 13 % (ref 11.5–15.5)
WBC: 7.5 10*3/uL (ref 4.0–10.5)

## 2014-09-16 LAB — COMPREHENSIVE METABOLIC PANEL
ALK PHOS: 79 U/L (ref 38–126)
ALT: 35 U/L (ref 14–54)
AST: 26 U/L (ref 15–41)
Albumin: 3.8 g/dL (ref 3.5–5.0)
Anion gap: 13 (ref 5–15)
BUN: 9 mg/dL (ref 6–20)
CHLORIDE: 98 mmol/L — AB (ref 101–111)
CO2: 23 mmol/L (ref 22–32)
CREATININE: 0.57 mg/dL (ref 0.44–1.00)
Calcium: 9.2 mg/dL (ref 8.9–10.3)
GFR calc Af Amer: >60 mL/min (ref >60)
Glucose, Bld: 77 mg/dL (ref 65–99)
POTASSIUM: 4.1 mmol/L (ref 3.5–5.1)
Sodium: 134 mmol/L — ABNORMAL LOW (ref 135–145)
Total Bilirubin: 1.5 mg/dL — ABNORMAL HIGH (ref 0.3–1.2)
Total Protein: 7.1 g/dL (ref 6.5–8.1)

## 2014-09-16 LAB — LIPASE, BLOOD: Lipase: 10 U/L — ABNORMAL LOW (ref 22–51)

## 2014-09-16 LAB — AMYLASE: AMYLASE: 42 U/L (ref 28–100)

## 2014-09-16 MED ORDER — SODIUM CHLORIDE 0.9 % IV SOLN
8.0000 mg | Freq: Once | INTRAVENOUS | Status: AC
  Administered 2014-09-16: 8 mg via INTRAVENOUS
  Filled 2014-09-16: qty 4

## 2014-09-16 MED ORDER — LACTATED RINGERS IV BOLUS (SEPSIS)
1000.0000 mL | Freq: Once | INTRAVENOUS | Status: AC
  Administered 2014-09-16: 1000 mL via INTRAVENOUS

## 2014-09-16 NOTE — MAU Note (Signed)
Not able to keep anything down. Gotten worse last few days.  Checked urine in office, was dry.. Has zofran, just picked up diglesis

## 2014-09-16 NOTE — MAU Provider Note (Signed)
History   Continuation and completion of visit started by V.Standard, CNM   [name] [name] is a  34y.o. G2P1 at 10.6wks who presents for hyperemesis.  Patient given IV hydration, IV antiemetics, and saltine crackers.  Patient reporting decrease in nausea, but feels it has not subsided because of hunger.  However, patient has had no vomiting and feels that she is well enough for discharge.  Patient reports having zofran, phenergan, and diclegis at home.     Chief Complaint  Patient presents with  . Emesis   HPI  OB History    Gravida Para Term Preterm AB TAB SAB Ectopic Multiple Living   2 1 1  0 0 0 0 0 0 1      ROS  See HPI Above Physical Exam   Blood pressure 116/88, pulse 103, temperature 98.8 F (37.1 C), temperature source Oral, resp. rate 18, height 4' 11.5" (1.511 m), weight 74.39 kg (164 lb), last menstrual period 06/20/2014.  Results for orders placed or performed during the hospital encounter of 09/16/14 (from the past 24 hour(s))  Urinalysis, Routine w reflex microscopic (not at [facility])     Status: Abnormal   Collection Time: 09/16/14  4:05 PM  Result Value Ref Range   Color, Urine AMBER (A) YELLOW   APPearance CLEAR CLEAR   Specific Gravity, Urine >1.030 (H) 1.005 - 1.030   pH 5.5 5.0 - 8.0   Glucose, UA NEGATIVE NEGATIVE mg/dL   Hgb urine dipstick MODERATE (A) NEGATIVE   Bilirubin Urine SMALL (A) NEGATIVE   Ketones, ur >80 (A) NEGATIVE mg/dL   Protein, ur NEGATIVE NEGATIVE mg/dL   Urobilinogen, UA 1.0 0.0 - 1.0 mg/dL   Nitrite NEGATIVE NEGATIVE   Leukocytes, UA NEGATIVE NEGATIVE  Urine microscopic-add on     Status: Abnormal   Collection Time: 09/16/14  4:05 PM  Result Value Ref Range   Squamous Epithelial / LPF FEW (A) RARE   WBC, UA 0-2 <3 WBC/hpf   RBC / HPF 0-2 <3 RBC/hpf   Bacteria, UA FEW (A) RARE   Urine-Other MUCOUS PRESENT   CBC with Differential/Platelet     Status: Abnormal   Collection Time: 09/16/14  6:08 PM  Result Value Ref Range   WBC 7.5  4.0 - 10.5 K/uL   RBC 4.34 3.87 - 5.11 MIL/uL   Hemoglobin 13.4 12.0 - 15.0 g/dL   HCT 16.1 09.6 - 04.5 %   MCV 88.5 78.0 - 100.0 fL   MCH 30.9 26.0 - 34.0 pg   MCHC 34.9 30.0 - 36.0 g/dL   RDW 40.9 81.1 - 91.4 %   Platelets 171 150 - 400 K/uL   Neutrophils Relative % 80 (H) 43 - 77 %   Neutro Abs 6.0 1.7 - 7.7 K/uL   Lymphocytes Relative 13 12 - 46 %   Lymphs Abs 1.0 0.7 - 4.0 K/uL   Monocytes Relative 7 3 - 12 %   Monocytes Absolute 0.5 0.1 - 1.0 K/uL   Eosinophils Relative 0 0 - 5 %   Eosinophils Absolute 0.0 0.0 - 0.7 K/uL   Basophils Relative 0 0 - 1 %   Basophils Absolute 0.0 0.0 - 0.1 K/uL  Comprehensive metabolic panel     Status: Abnormal   Collection Time: 09/16/14  6:08 PM  Result Value Ref Range   Sodium 134 (L) 135 - 145 mmol/L   Potassium 4.1 3.5 - 5.1 mmol/L   Chloride 98 (L) 101 - 111 mmol/L   CO2 23  22 - 32 mmol/L   Glucose, Bld 77 65 - 99 mg/dL   BUN 9 6 - 20 mg/dL   Creatinine, Ser 1.61 0.44 - 1.00 mg/dL   Calcium 9.2 8.9 - 09.6 mg/dL   Total Protein 7.1 6.5 - 8.1 g/dL   Albumin 3.8 3.5 - 5.0 g/dL   AST 26 15 - 41 U/L   ALT 35 14 - 54 U/L   Alkaline Phosphatase 79 38 - 126 U/L   Total Bilirubin 1.5 (H) 0.3 - 1.2 mg/dL   GFR calc non Af Amer >60 >60 mL/min   GFR calc Af Amer >60 >60 mL/min   Anion gap 13 5 - 15  Amylase     Status: None   Collection Time: 09/16/14  6:08 PM  Result Value Ref Range   Amylase 42 28 - 100 U/L  Lipase, blood     Status: Abnormal   Collection Time: 09/16/14  6:08 PM  Result Value Ref Range   Lipase 10 (L) 22 - 51 U/L    Physical Exam Completed by earlier provider  ED Course  Assessment: IUP at 10.6wks Hyperemesis  Plan: -Discussed medication regime to include: Take Diclegis QID--two at bedtime, one at breakfast and one midday.  After control of n/v has been established start to titrate off medications.  Patient further instructed to take zofran and/or phenergan for breakthrough N/V or if she is has d/c daytime  diclegis dosing due to fatigue.  -Work excuse given--OOW until August 1 while establishing diclegis dosing -Keep appt as scheduled: 09/28/2014 -Bleeding Precautions -Encouraged to call if any questions or concerns arise prior to next scheduled office visit.  -Discharged to home in improved condition  Ottilia Pippenger LYNN CNM, MSN 09/16/2014 9:23 PM

## 2014-09-16 NOTE — MAU Provider Note (Signed)
[name] [name] is a 34 y.o. G2P1 at 10.5 weeks presents from the office with hyperemesis.   History     Patient Active Problem List   Diagnosis Date Noted  . Left ankle sprain 07/01/2014  . GAD (generalized anxiety disorder) 06/22/2014  . PTSD (post-traumatic stress disorder) 06/22/2014  . Acute upper respiratory infection 04/26/2014  . Binge eating disorder 04/21/2014  . GERD (gastroesophageal reflux disease) 04/21/2014  . Acne vulgaris 01/11/2014  . Stress due to illness of family member 11/29/2013  . Obesity (BMI 30.0-34.9) 01/12/2013  . Malpositioned IUD 04/01/2012  . Anemia 01/31/2012  . History of abnormal Pap smear 08/13/2011  . History of depression 08/13/2011  . Migraines 03/24/2011    Chief Complaint  Patient presents with  . Emesis   HPI  OB History    Gravida Para Term Preterm AB TAB SAB Ectopic Multiple Living   0 0 0 0 0 0 1      Past Medical History  Diagnosis Date  . Migraines     MIGRAINES;HAS TAKEN MAXALT AND VICODIN IN THE PAST OR CAFFEINE  . Depression     HAS TAKEN LEXAPRO  DAILY BEFORE PREGNANCY;WEANED SELF OFF  . Infection     YEAST INF;TYPICALLY AFTER ANTIBXS;NOT FREQ  . Abnormal Pap smear 2005    PRE-CANCEROUS CELLS ON CX;COLPO DONE AND CELLS "BURNED OFF";LAST PAP 2010 OR 2011,WAS NORMAL  . Anxiety   . GAD (generalized anxiety disorder) 06/22/2014  . PTSD (post-traumatic stress disorder) 06/22/2014    Past Surgical History  Procedure Laterality Date  . Wisdom tooth extraction  1998    ALL 4 EXTRACTED  . Colposcopy    . Cryotherapy    . Cesarean section  01/28/2012    Procedure: CESAREAN SECTION;  Surgeon: Esmeralda Arthur, MD;  Location: [facility];  Service: Obstetrics;  Laterality: N/A;  . Laparoscopic appendectomy N/A 04/01/2012    Procedure: APPENDECTOMY LAPAROSCOPIC;  Surgeon: Ardeth Sportsman, MD;  Location: [facility];  Service: General;  Laterality: N/A;  . Appendectomy      Family History  Problem Relation Age of Onset  .  Migraines Father     IN THE PAST  . Alzheimer's disease Father     DX'D @ 12  . Migraines Paternal Grandfather   . Thyroid disease Mother   . Breast cancer Paternal Aunt   . Cancer Paternal Aunt   . Cancer Maternal Grandmother   . Breast cancer Maternal Grandmother     History  Substance Use Topics  . Smoking status: Never Smoker   . Smokeless tobacco: Never Used  . Alcohol Use: 0.6 oz/week    1 Virginie Josten drinks or equivalent per week     Comment: RARELY    Allergies:  Allergies  Allergen Reactions  . Wellbutrin [Bupropion]     Made pt angry.     Prescriptions prior to admission  Medication Sig Dispense Refill Last Dose  . ibuprofen (ADVIL,MOTRIN) 800 MG tablet Take 1 tablet (800 mg total) by mouth every 8 (eight) hours as needed for mild pain. 30 tablet 0   . pantoprazole (PROTONIX) 40 MG tablet Take 1 tablet (40 mg total) by mouth daily. 90 tablet 1 Taking    ROS See HPI above, all other systems are negative  Physical Exam   Blood pressure 116/88, pulse 103, temperature 98.8 F (37.1 C), temperature source Oral, resp. rate 18, height 4' 11.5" (1.511 m), weight 164 lb (74.39 kg), last menstrual period  06/20/2014.  Physical Exam Ext:  WNL ABD: Soft, non tender to palpation, no rebound or guarding SVE: deferred   ED Course  Assessment: IUP at  10.5 weeks Membranes: intact FHR:  CTX:  none   Plan: IVF Labs: CBC, amylase, lipase, CMP    Alma Mohiuddin, CNM, MSN 09/16/2014. 5:36 PM

## 2014-09-16 NOTE — Discharge Instructions (Signed)
Hyperemesis Gravidarum °Hyperemesis gravidarum is a severe form of nausea and vomiting that happens during pregnancy. Hyperemesis is worse than morning sickness. It may cause you to have nausea or vomiting all day for many days. It may keep you from eating and drinking enough food and liquids. Hyperemesis usually occurs during the first half (the first 20 weeks) of pregnancy. It often goes away once a woman is in her second half of pregnancy. However, sometimes hyperemesis continues through an entire pregnancy.  °CAUSES  °The cause of this condition is not completely known but is thought to be related to changes in the body's hormones when pregnant. It could be from the high level of the pregnancy hormone or an increase in estrogen in the body.  °SIGNS AND SYMPTOMS  °· Severe nausea and vomiting. °· Nausea that does not go away. °· Vomiting that does not allow you to keep any food down. °· Weight loss and body fluid loss (dehydration). °· Having no desire to eat or not liking food you have previously enjoyed. °DIAGNOSIS  °Your health care provider will do a physical exam and ask you about your symptoms. He or she may also order blood tests and urine tests to make sure something else is not causing the problem.  °TREATMENT  °You may only need medicine to control the problem. If medicines do not control the nausea and vomiting, you will be treated in the hospital to prevent dehydration, increased acid in the blood (acidosis), weight loss, and changes in the electrolytes in your body that may harm the unborn baby (fetus). You may need IV fluids.  °HOME CARE INSTRUCTIONS  °· Only take over-the-counter or prescription medicines as directed by your health care provider. °· Try eating a couple of dry crackers or toast in the morning before getting out of bed. °· Avoid foods and smells that upset your stomach. °· Avoid fatty and spicy foods. °· Eat 5-6 small meals a day. °· Do not drink when eating meals. Drink between  meals. °· For snacks, eat high-protein foods, such as cheese. °· Eat or suck on things that have ginger in them. Ginger helps nausea. °· Avoid food preparation. The smell of food can spoil your appetite. °· Avoid iron pills and iron in your multivitamins until after 3-4 months of being pregnant. However, consult with your health care provider before stopping any prescribed iron pills. °SEEK MEDICAL CARE IF:  °· Your abdominal pain increases. °· You have a severe headache. °· You have vision problems. °· You are losing weight. °SEEK IMMEDIATE MEDICAL CARE IF:  °· You are unable to keep fluids down. °· You vomit blood. °· You have constant nausea and vomiting. °· You have excessive weakness. °· You have extreme thirst. °· You have dizziness or fainting. °· You have a fever or persistent symptoms for more than 2-3 days. °· You have a fever and your symptoms suddenly get worse. °MAKE SURE YOU:  °· Understand these instructions. °· Will watch your condition. °· Will get help right away if you are not doing well or get worse. °Document Released: 02/05/2005 Document Revised: 11/26/2012 Document Reviewed: 09/17/2012 °ExitCare® Patient Information ©2015 [employer]. This information is not intended to replace advice given to you by your health care provider. Make sure you discuss any questions you have with your health care provider. ° °

## 2014-09-30 LAB — HM PAP SMEAR: HM Pap smear: NEGATIVE

## 2014-10-18 ENCOUNTER — Encounter: Payer: Self-pay | Admitting: Physician Assistant

## 2014-10-18 DIAGNOSIS — Z3490 Encounter for supervision of normal pregnancy, unspecified, unspecified trimester: Secondary | ICD-10-CM | POA: Insufficient documentation

## 2015-01-30 ENCOUNTER — Ambulatory Visit (INDEPENDENT_AMBULATORY_CARE_PROVIDER_SITE_OTHER): Payer: 59 | Admitting: Family Medicine

## 2015-01-30 VITALS — BP 108/64 | HR 100 | Temp 97.9°F | Resp 16 | Ht 59.5 in | Wt 182.0 lb

## 2015-01-30 DIAGNOSIS — J029 Acute pharyngitis, unspecified: Secondary | ICD-10-CM | POA: Diagnosis not present

## 2015-01-30 LAB — POCT RAPID STREP A (OFFICE): Rapid Strep A Screen: NEGATIVE

## 2015-01-30 MED ORDER — AMOXICILLIN 500 MG PO CAPS: 500.0000 mg | ORAL_CAPSULE | Freq: Two times a day (BID) | ORAL | Status: DC

## 2015-01-30 NOTE — Progress Notes (Addendum)
   Subjective:    Patient ID: [name] PontLisa M Vaughan, female    DOB: Apr 11, 1980, 34 y.o.   MRN: [id number] By signing my name below, I, Littie Deedsichard Sun, attest that this documentation has been prepared under the direction and in the presence of Elvina SidleKurt Abdirahman Chittum, MD.  Electronically Signed: Littie Deedsichard Sun, Medical Scribe. 01/30/2015. 10:00 AM.  HPI HPI Comments: [name] [name] is a 34 y.o. female who presents to the [facility] complaining of gradual onset sore throat that started 2 days ago which initially started as a tickle in her throat. Patient developed a cough later that day, then chills, diaphoresis, and chest congestion later that night. She denies fever. She has allergies to Wellbutrin.  [place]. 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 She is currently pregnant and is due 2/26. Her pregnancy has been going well so far. Her daughter recently turned 3.  Review of Systems  Constitutional: Positive for chills and diaphoresis. Negative for fever.  HENT: Positive for congestion and sore throat.        Objective:   Physical Exam CONSTITUTIONAL: Well developed/well nourished HEAD: Normocephalic/atraumatic EYES: EOM/PERRL ENMT: Mucous membranes moist; very red throat without exudates; throat is quite red NECK: supple no meningeal signs, no adenopathy CV: S1/S2 noted, no murmurs/rubs/gallops noted LUNGS: Lungs are clear to auscultation bilaterally, no apparent distress GU: no cva tenderness NEURO: Pt is awake/alert, moves all extremitiesx4 EXTREMITIES: pulses normal, full ROM SKIN: warm, color normal PSYCH: no abnormalities of mood noted  Results for orders placed or performed in visit on 01/30/15  POCT rapid strep A  Result Value Ref Range   Rapid Strep A Screen Negative Negative       Assessment & Plan:    This chart was scribed in my presence and reviewed by me personally.    ICD-9-CM ICD-10-CM   1. Sore  throat 462 J02.9 POCT rapid strep A     Culture, Group A Strep     amoxicillin (AMOXIL) 500 MG capsule     Signed, Elvina SidleKurt Shalee Paolo, MD

## 2015-01-31 LAB — CULTURE, GROUP A STREP: Organism ID, Bacteria: NORMAL

## 2015-02-20 NOTE — L&D Delivery Note (Addendum)
Vaginal Delivery Note The pt utilized an epidural as pain management.   Spontaneous rupture of membranes yesterday, at 0300 clear per pt.  GBS was negative.  Cervical dilation was complete at 2137 on 04/21/15.  NICHD Category 2.    Pushing with guidance began at  2333 on 04/21/15.   After 1 hour(s) and  2 minutes of pushing the head, shoulders and the body of a viable female infant "Delaney" delivered spontaneously with maternal effort in the ROA position at 0035 followed by a large amount of thick meconium.   Tight [place] x 2 and a body cord x1, unable to reduce, infant somersaulted thru without difficulty.   With diminished tone and a weak cry, the infant was placed on moms abd. The cord was immediately clamped, cut and the infant was handed to the NICU team including Dr. Jamie Brookesavid Ehrmann for immediate assessment.Cord gases were collected.  Spontaneous delivery of a intact placenta with a 3 vessel cord via Shultz at [phone].   Episiotomy: None   The vulva, perineum, vaginal vault, rectum and cervix were inspected and revealed a second degree perineal repaired using a 3-0 vicryl on a CT needle and a left labial laceration which was repaired using a 4-0 vicryl on a SH needle with 30cc of 1% lidocaine.  The rectum sphincter intact after the repair.   Patient tolerated repair well.   Postpartum pitocin as ordered.  Fundus firm, lochia minimum, bleeding under control.  EBL 250, Pt hemodynamically stable.   Sponge, laps and needle count correct and verified with the primary care nurse.  Attending MD available at all times.    Routine postpartum orders   Mother desires Maxie BetterMerina for contraception  Mom plans to breastfeed  Placenta to pathology: NO     Cord Gases sent to lab: NO Cord blood sent to lab: YES   APGARS:  7 at 1 minute and 9 at 5 minutes Weight:. pending     Both mom and baby were left in stable condition, baby skin to skin.      Feven Alderfer, CNM, MSN 04/22/2015. 1:43 AM

## 2015-02-24 DIAGNOSIS — O99810 Abnormal glucose complicating pregnancy: Secondary | ICD-10-CM | POA: Diagnosis not present

## 2015-02-28 ENCOUNTER — Inpatient Hospital Stay (HOSPITAL_COMMUNITY)
Admission: AD | Admit: 2015-02-28 | Discharge: 2015-02-28 | Disposition: A | Discharge status: Home / Self Care | Payer: 59 | Source: Ambulatory Visit | Attending: Obstetrics & Gynecology | Admitting: Obstetrics & Gynecology

## 2015-02-28 ENCOUNTER — Inpatient Hospital Stay (HOSPITAL_COMMUNITY): Payer: 59

## 2015-02-28 ENCOUNTER — Encounter (HOSPITAL_COMMUNITY): Payer: Self-pay | Admitting: *Deleted

## 2015-02-28 DIAGNOSIS — F4310 Post-traumatic stress disorder, unspecified: Secondary | ICD-10-CM | POA: Diagnosis not present

## 2015-02-28 DIAGNOSIS — F411 Generalized anxiety disorder: Secondary | ICD-10-CM | POA: Diagnosis not present

## 2015-02-28 DIAGNOSIS — R109 Unspecified abdominal pain: Secondary | ICD-10-CM | POA: Insufficient documentation

## 2015-02-28 DIAGNOSIS — G43909 Migraine, unspecified, not intractable, without status migrainosus: Secondary | ICD-10-CM | POA: Diagnosis not present

## 2015-02-28 DIAGNOSIS — O26893 Other specified pregnancy related conditions, third trimester: Secondary | ICD-10-CM | POA: Insufficient documentation

## 2015-02-28 DIAGNOSIS — E669 Obesity, unspecified: Secondary | ICD-10-CM | POA: Insufficient documentation

## 2015-02-28 DIAGNOSIS — K219 Gastro-esophageal reflux disease without esophagitis: Secondary | ICD-10-CM | POA: Insufficient documentation

## 2015-02-28 DIAGNOSIS — M545 Low back pain: Secondary | ICD-10-CM | POA: Diagnosis not present

## 2015-02-28 DIAGNOSIS — Z3A34 34 weeks gestation of pregnancy: Secondary | ICD-10-CM | POA: Diagnosis not present

## 2015-02-28 DIAGNOSIS — F329 Major depressive disorder, single episode, unspecified: Secondary | ICD-10-CM | POA: Diagnosis not present

## 2015-02-28 DIAGNOSIS — R1012 Left upper quadrant pain: Secondary | ICD-10-CM | POA: Diagnosis not present

## 2015-02-28 LAB — URINALYSIS, ROUTINE W REFLEX MICROSCOPIC
Bilirubin Urine: NEGATIVE
Glucose, UA: NEGATIVE mg/dL
Hgb urine dipstick: NEGATIVE
KETONES UR: 15 mg/dL — AB
LEUKOCYTES UA: NEGATIVE
NITRITE: NEGATIVE
PROTEIN: NEGATIVE mg/dL
Specific Gravity, Urine: 1.025 (ref 1.005–1.030)
pH: 6 (ref 5.0–8.0)

## 2015-02-28 LAB — CBC WITH DIFFERENTIAL/PLATELET
BASOS ABS: 0 10*3/uL (ref 0.0–0.1)
BASOS PCT: 0 %
Eosinophils Absolute: 0.1 10*3/uL (ref 0.0–0.7)
Eosinophils Relative: 0 %
HEMATOCRIT: 34.9 % — AB (ref 36.0–46.0)
Hemoglobin: 11.6 g/dL — ABNORMAL LOW (ref 12.0–15.0)
Lymphocytes Relative: 12 %
Lymphs Abs: 1.5 10*3/uL (ref 0.7–4.0)
MCH: 29.4 pg (ref 26.0–34.0)
MCHC: 33.2 g/dL (ref 30.0–36.0)
MCV: 88.6 fL (ref 78.0–100.0)
MONO ABS: 0.4 10*3/uL (ref 0.1–1.0)
Monocytes Relative: 3 %
NEUTROS ABS: 9.9 10*3/uL — AB (ref 1.7–7.7)
Neutrophils Relative %: 85 %
PLATELETS: 189 10*3/uL (ref 150–400)
RBC: 3.94 MIL/uL (ref 3.87–5.11)
RDW: 14 % (ref 11.5–15.5)
WBC: 11.9 10*3/uL — AB (ref 4.0–10.5)

## 2015-02-28 MED ORDER — CYCLOBENZAPRINE HCL 5 MG PO TABS: 5.0000 mg | ORAL_TABLET | Freq: Three times a day (TID) | ORAL | Status: DC | PRN

## 2015-02-28 MED ORDER — CYCLOBENZAPRINE HCL 10 MG PO TABS
10.0000 mg | ORAL_TABLET | Freq: Once | ORAL | Status: AC
  Administered 2015-02-28: 10 mg via ORAL
  Filled 2015-02-28: qty 1

## 2015-02-28 NOTE — MAU Note (Signed)
Pt states Left flank pain started x 2 weeks ago when she had the stomach bug and was vomiting a lot.  Has gotten worse since Friday.  0600 this am left upper side abd pain woke her up.  Has tried heat and tylenol without any relief.  Good fetal movement.

## 2015-02-28 NOTE — Discharge Instructions (Signed)
Flank Pain °Flank pain refers to pain that is located on the side of the body between the upper abdomen and the back. The pain may occur over a short period of time (acute) or may be long-term or reoccurring (chronic). It may be mild or severe. Flank pain can be caused by many things. °CAUSES  °Some of the more common causes of flank pain include: °· Muscle strains.   °· Muscle spasms.   °· A disease of your spine (vertebral disk disease).   °· A lung infection (pneumonia).   °· Fluid around your lungs (pulmonary edema).   °· A kidney infection.   °· Kidney stones.   °· A very painful skin rash caused by the chickenpox virus (shingles).   °· Gallbladder disease.   °HOME CARE INSTRUCTIONS  °Home care will depend on the cause of your pain. In general, °· Rest as directed by your caregiver. °· Drink enough fluids to keep your urine clear or pale yellow. °· Only take over-the-counter or prescription medicines as directed by your caregiver. Some medicines may help relieve the pain. °· Tell your caregiver about any changes in your pain. °· Follow up with your caregiver as directed. °SEEK IMMEDIATE MEDICAL CARE IF:  °· Your pain is not controlled with medicine.   °· You have new or worsening symptoms. °· Your pain increases.   °· You have abdominal pain.   °· You have shortness of breath.   °· You have persistent nausea or vomiting.   °· You have swelling in your abdomen.   °· You feel faint or pass out.   °· You have blood in your urine. °· You have a fever or persistent symptoms for more than 2-3 days. °· You have a fever and your symptoms suddenly get worse. °MAKE SURE YOU:  °· Understand these instructions. °· Will watch your condition. °· Will get help right away if you are not doing well or get worse. °  °This information is not intended to replace advice given to you by your health care provider. Make sure you discuss any questions you have with your health care provider. °  °Document Released: 03/29/2005 Document  Revised: 10/31/2011 Document Reviewed: 09/20/2011 °Elsevier Interactive Patient Education ©2016 [employer] ° °

## 2015-02-28 NOTE — MAU Provider Note (Signed)
History    [name] [name] is a 35y.o. G2P1001 at 33.2wks who presents, unannounced, for left flank pain.  Patient reports pain started at 0600 and is intermittent but shooting/piercing pain when it occurs at full intensity.  Patient reports that "it feels like a contraction, but it is only in the one spot."  Patient states pain is "more abdominal/flank pain on the left side."  Patient states she has been experiencing left sided back pain for about 2 weeks that was relieved with heating pads and tylenol.  Patient reports that this morning pain was so bad "it brought me to tears twice."  Patient reports attempting heating pad and tylenol was utilized with minimal relief.  Patient denies problems with urination, constipation, diarrhea, N/V, or fever.  Patient denies recent treatment for UTI, but reports stomach virus 2 weeks ago.  Patient reports no breakfast this am and last dose of tylenol at 0630.   Patient Active Problem List   Diagnosis Date Noted  . Pregnancy 10/18/2014  . Left ankle sprain 07/01/2014  . GAD (generalized anxiety disorder) 06/22/2014  . PTSD (post-traumatic stress disorder) 06/22/2014  . Acute upper respiratory infection 04/26/2014  . Binge eating disorder 04/21/2014  . GERD (gastroesophageal reflux disease) 04/21/2014  . Acne vulgaris 01/11/2014  . Stress due to illness of family member 11/29/2013  . Obesity (BMI 30.0-34.9) 01/12/2013  . Malpositioned IUD (HCC) 04/01/2012  . Anemia 01/31/2012  . History of abnormal Pap smear 08/13/2011  . History of depression 08/13/2011  . Migraines 03/24/2011    Chief Complaint  Patient presents with  . Abdominal Pain  . Flank Pain   HPI  OB History    Gravida Para Term Preterm AB TAB SAB Ectopic Multiple Living   2 1 1  0 0 0 0 0 0 1      Past Medical History  Diagnosis Date  . Migraines     MIGRAINES;HAS TAKEN MAXALT AND VICODIN IN THE PAST OR CAFFEINE  . Depression     HAS TAKEN LEXAPRO 40MG  DAILY BEFORE  PREGNANCY;WEANED SELF OFF  . Infection     YEAST INF;TYPICALLY AFTER ANTIBXS;NOT FREQ  . Abnormal Pap smear 2005    PRE-CANCEROUS CELLS ON CX;COLPO DONE AND CELLS "BURNED OFF";LAST PAP 2010 OR 2011,WAS NORMAL  . Anxiety   . GAD (generalized anxiety disorder) 06/22/2014  . PTSD (post-traumatic stress disorder) 06/22/2014  . Vaginal Pap smear, abnormal     Past Surgical History  Procedure Laterality Date  . Wisdom tooth extraction  1998    ALL 4 EXTRACTED  . Colposcopy    . Cryotherapy    . Cesarean section  01/28/2012    Procedure: CESAREAN SECTION;  Surgeon: Esmeralda ArthurSandra A Rivard, MD;  Location: [facility];  Service: Obstetrics;  Laterality: N/A;  . Laparoscopic appendectomy N/A 04/01/2012    Procedure: APPENDECTOMY LAPAROSCOPIC;  Surgeon: Ardeth SportsmanSteven C. Gross, MD;  Location: [facility];  Service: General;  Laterality: N/A;  . Appendectomy      Family History  Problem Relation Age of Onset  . Migraines Father     IN THE PAST  . Alzheimer's disease Father     DX'D @ 7460  . Migraines Paternal Grandfather   . Thyroid disease Mother   . Breast cancer Paternal Aunt   . Cancer Paternal Aunt   . Cancer Maternal Grandmother   . Breast cancer Maternal Grandmother     Social History  Substance Use Topics  . Smoking status: Never Smoker   .  Smokeless tobacco: Never Used  . Alcohol Use: 0.6 oz/week    1 Standard drinks or equivalent per week     Comment: RARELY    Allergies:  Allergies  Allergen Reactions  . Wellbutrin [Bupropion] Other (See Comments)    Made pt angry.     Prescriptions prior to admission  Medication Sig Dispense Refill Last Dose  . amoxicillin (AMOXIL) 500 MG capsule Take 1 capsule (500 mg total) by mouth 2 (two) times daily. 20 capsule 0   . Doxylamine-Pyridoxine (DICLEGIS) 10-10 MG TBEC Take by mouth once.   Taking  . ondansetron (ZOFRAN) 8 MG tablet Take 8 mg by mouth every 8 (eight) hours as needed for nausea or vomiting.   09/16/2014 at 0630  . promethazine (PHENERGAN) 12.5  MG tablet Take 12.5 mg by mouth at bedtime as needed for nausea or vomiting.   Past Week at Unknown time  . ranitidine (ZANTAC) 150 MG tablet Take 150 mg by mouth 2 (two) times daily as needed for heartburn.   09/16/2014 at Unknown time    Review of Systems  Constitutional: Negative for fever.  HENT: Negative.   Eyes: Negative.   Respiratory: Negative.   Cardiovascular: Negative.   Gastrointestinal: Positive for abdominal pain.  Genitourinary: Negative.   Musculoskeletal: Positive for back pain.  Neurological: Negative.   Endo/Heme/Allergies: Negative.   Psychiatric/Behavioral: Negative.     See HPI Above Physical Exam   Blood pressure 120/81, pulse 102, temperature 97.5 F (36.4 C), temperature source Oral, resp. rate 20, last menstrual period 06/20/2014, SpO2 100 %.  Results for orders placed or performed during the hospital encounter of 02/28/15 (from the past 24 hour(s))  Urinalysis, Routine w reflex microscopic (not at [facility])     Status: Abnormal   Collection Time: 02/28/15  9:35 AM  Result Value Ref Range   Color, Urine YELLOW YELLOW   APPearance CLEAR CLEAR   Specific Gravity, Urine 1.025 1.005 - 1.030   pH 6.0 5.0 - 8.0   Glucose, UA NEGATIVE NEGATIVE mg/dL   Hgb urine dipstick NEGATIVE NEGATIVE   Bilirubin Urine NEGATIVE NEGATIVE   Ketones, ur 15 (A) NEGATIVE mg/dL   Protein, ur NEGATIVE NEGATIVE mg/dL   Nitrite NEGATIVE NEGATIVE   Leukocytes, UA NEGATIVE NEGATIVE    Physical Exam  Vitals reviewed. Constitutional: She is oriented to person, place, and time. She appears well-developed and well-nourished. No distress.  HENT:  Head: Normocephalic and atraumatic.  Eyes: EOM are normal.  Neck: Normal range of motion.  Cardiovascular: Normal rate, regular rhythm and normal heart sounds.   Respiratory: Effort normal and breath sounds normal.  GI: Soft. Bowel sounds are normal. She exhibits mass (.Left side flank below ribs.). There is tenderness (.Left side below  ribs--~5-6cm mass palpated). There is no rebound and no guarding.  Gravid--fundal height appears AGA, Soft, NT   Neurological: She is alert and oriented to person, place, and time.  Skin: Skin is warm and dry.  Psychiatric: She has a normal mood and affect. Her behavior is normal.     FHR:150 bpm, Mod Var, -Decels, +Accels UC: None graphed  Abdominal US EXAM: ABDOMEN ULTRASOUND COMPLETE  COMPARISON: CT on 04/06/2012  FINDINGS: Gallbladder: No gallstones or wall thickening visualized. No sonographic Murphy sign noted by sonographer.  Common bile duct: Diameter: 4 mm, within normal limits.  Liver: No focal lesion identified. Within normal limits in parenchymal echogenicity.  IVC: No abnormality visualized.  Pancreas: Not well visualized due to overlying bowel gas.  Spleen: Size and appearance within normal limits. 3 cm accessory splenule noted along the inferior margin of spleen.  Right Kidney: Length: 11.7 cm. Echogenicity within normal limits. No mass or hydronephrosis visualized.  Left Kidney: Length: 12.1 cm. Echogenicity within normal limits. No mass or hydronephrosis visualized.  Abdominal aorta: No aneurysm visualized.  Other findings: None.  IMPRESSION: Negative. No evidence of hydronephrosis or other significant abnormality.  ED Course  Assessment: IUP at 33.2wks Cat I FT Left Flank Pain  Plan: -PE as above -UA with minor ketones, otherwise normal -Give flexeril 10mg  and reassess  Follow Up (1100) -Patient reports minimal relief with flexeril dosing -Will send for Abdominal US  -Labs: CBC with differential   Follow Up (1252) -Abdominal US Negative -CBC WNL -In room to discuss results with patient -No Q/c -Reports pain has improved -Will send Rx for flexeril to pharmacy: 1-2 tabs Q8hrs PRN. Disp 30, RF 0 -Encouraged to continue non-pharmacologic methods including warm/ice packs, position changes, proper body mechanics -Keep  appt as scheduled: 03/08/2015 -Encouraged to call if any questions or concerns arise prior to next scheduled office visit.  -Discharged to home in improved condition  Cherre Robins CNM, MSN 02/28/2015 9:59 AM

## 2015-03-22 DIAGNOSIS — Z36 Encounter for antenatal screening of mother: Secondary | ICD-10-CM | POA: Diagnosis not present

## 2015-03-22 DIAGNOSIS — Z3A36 36 weeks gestation of pregnancy: Secondary | ICD-10-CM | POA: Diagnosis not present

## 2015-03-22 DIAGNOSIS — Z3491 Encounter for supervision of normal pregnancy, unspecified, first trimester: Secondary | ICD-10-CM | POA: Diagnosis not present

## 2015-03-22 DIAGNOSIS — Z331 Pregnant state, incidental: Secondary | ICD-10-CM | POA: Diagnosis not present

## 2015-03-22 DIAGNOSIS — R112 Nausea with vomiting, unspecified: Secondary | ICD-10-CM | POA: Diagnosis not present

## 2015-03-22 MED FILL — DICLEGIS DR 10-10 MG TABLET: 10-10 | 30 days supply | Qty: 100 | Fill #0

## 2015-04-20 DIAGNOSIS — O480 Post-term pregnancy: Secondary | ICD-10-CM | POA: Diagnosis not present

## 2015-04-20 DIAGNOSIS — Z3A40 40 weeks gestation of pregnancy: Secondary | ICD-10-CM | POA: Diagnosis not present

## 2015-04-21 ENCOUNTER — Inpatient Hospital Stay (HOSPITAL_COMMUNITY)
Admission: AD | Admit: 2015-04-21 | Discharge: 2015-04-24 | DRG: 775 | Disposition: A | Discharge status: Home / Self Care | Payer: 59 | Source: Ambulatory Visit | Attending: Obstetrics & Gynecology | Admitting: Obstetrics & Gynecology

## 2015-04-21 ENCOUNTER — Inpatient Hospital Stay (HOSPITAL_COMMUNITY): Payer: 59 | Admitting: Anesthesiology

## 2015-04-21 ENCOUNTER — Encounter (HOSPITAL_COMMUNITY): Payer: Self-pay

## 2015-04-21 DIAGNOSIS — O480 Post-term pregnancy: Secondary | ICD-10-CM | POA: Diagnosis not present

## 2015-04-21 DIAGNOSIS — O9962 Diseases of the digestive system complicating childbirth: Secondary | ICD-10-CM | POA: Diagnosis present

## 2015-04-21 DIAGNOSIS — O9081 Anemia of the puerperium: Secondary | ICD-10-CM | POA: Diagnosis present

## 2015-04-21 DIAGNOSIS — K219 Gastro-esophageal reflux disease without esophagitis: Secondary | ICD-10-CM | POA: Diagnosis present

## 2015-04-21 DIAGNOSIS — O99214 Obesity complicating childbirth: Secondary | ICD-10-CM | POA: Diagnosis present

## 2015-04-21 DIAGNOSIS — Z3A41 41 weeks gestation of pregnancy: Secondary | ICD-10-CM | POA: Diagnosis not present

## 2015-04-21 DIAGNOSIS — Z820 Family history of epilepsy and other diseases of the nervous system: Secondary | ICD-10-CM | POA: Diagnosis not present

## 2015-04-21 DIAGNOSIS — O4292 Full-term premature rupture of membranes, unspecified as to length of time between rupture and onset of labor: Secondary | ICD-10-CM | POA: Diagnosis present

## 2015-04-21 DIAGNOSIS — Z6839 Body mass index (BMI) 39.0-39.9, adult: Secondary | ICD-10-CM | POA: Diagnosis not present

## 2015-04-21 DIAGNOSIS — Z3A40 40 weeks gestation of pregnancy: Secondary | ICD-10-CM

## 2015-04-21 DIAGNOSIS — D649 Anemia, unspecified: Secondary | ICD-10-CM | POA: Diagnosis present

## 2015-04-21 LAB — CBC
HCT: 35.1 % — ABNORMAL LOW (ref 36.0–46.0)
Hemoglobin: 11.3 g/dL — ABNORMAL LOW (ref 12.0–15.0)
MCH: 26.4 pg (ref 26.0–34.0)
MCHC: 32.2 g/dL (ref 30.0–36.0)
MCV: 82 fL (ref 78.0–100.0)
PLATELETS: 217 10*3/uL (ref 150–400)
RBC: 4.28 MIL/uL (ref 3.87–5.11)
RDW: 15.2 % (ref 11.5–15.5)
WBC: 14.6 10*3/uL — ABNORMAL HIGH (ref 4.0–10.5)

## 2015-04-21 LAB — OB RESULTS CONSOLE HIV ANTIBODY (ROUTINE TESTING): HIV: NONREACTIVE

## 2015-04-21 LAB — TYPE AND SCREEN
ABO/RH(D): O POS
Antibody Screen: NEGATIVE

## 2015-04-21 LAB — OB RESULTS CONSOLE RPR: RPR: NONREACTIVE

## 2015-04-21 LAB — OB RESULTS CONSOLE GC/CHLAMYDIA
CHLAMYDIA, DNA PROBE: NEGATIVE
GC PROBE AMP, GENITAL: NEGATIVE

## 2015-04-21 LAB — POCT FERN TEST: POCT Fern Test: POSITIVE

## 2015-04-21 LAB — RPR: RPR Ser Ql: NONREACTIVE

## 2015-04-21 LAB — OB RESULTS CONSOLE GBS: GBS: NEGATIVE

## 2015-04-21 MED ORDER — OXYCODONE-ACETAMINOPHEN 5-325 MG PO TABS: 1.0000 | ORAL_TABLET | ORAL | Status: DC | PRN

## 2015-04-21 MED ORDER — EPHEDRINE 5 MG/ML INJ
10.0000 mg | INTRAVENOUS | Status: DC | PRN
  Filled 2015-04-21: qty 2

## 2015-04-21 MED ORDER — ALBUTEROL SULFATE (2.5 MG/3ML) 0.083% IN NEBU: 3.0000 mL | INHALATION_SOLUTION | Freq: Four times a day (QID) | RESPIRATORY_TRACT | Status: DC | PRN

## 2015-04-21 MED ORDER — DIPHENHYDRAMINE HCL 50 MG/ML IJ SOLN: 12.5000 mg | INTRAMUSCULAR | Status: DC | PRN

## 2015-04-21 MED ORDER — SODIUM CHLORIDE 0.9 % IV SOLN
3.0000 g | Freq: Four times a day (QID) | INTRAVENOUS | Status: DC
  Filled 2015-04-21 (×3): qty 3

## 2015-04-21 MED ORDER — PHENYLEPHRINE 40 MCG/ML (10ML) SYRINGE FOR IV PUSH (FOR BLOOD PRESSURE SUPPORT)
PREFILLED_SYRINGE | INTRAVENOUS | Status: AC
  Filled 2015-04-21: qty 20

## 2015-04-21 MED ORDER — OXYCODONE-ACETAMINOPHEN 5-325 MG PO TABS: 2.0000 | ORAL_TABLET | ORAL | Status: DC | PRN

## 2015-04-21 MED ORDER — PHENYLEPHRINE 40 MCG/ML (10ML) SYRINGE FOR IV PUSH (FOR BLOOD PRESSURE SUPPORT)
80.0000 ug | PREFILLED_SYRINGE | INTRAVENOUS | Status: DC | PRN
  Filled 2015-04-21: qty 2

## 2015-04-21 MED ORDER — FENTANYL 2.5 MCG/ML BUPIVACAINE 1/10 % EPIDURAL INFUSION (WH - ANES)
INTRAMUSCULAR | Status: AC
  Filled 2015-04-21: qty 125

## 2015-04-21 MED ORDER — OXYTOCIN BOLUS FROM INFUSION
500.0000 mL | INTRAVENOUS | Status: DC
  Administered 2015-04-22: 500 mL via INTRAVENOUS

## 2015-04-21 MED ORDER — LACTATED RINGERS IV SOLN
500.0000 mL | Freq: Once | INTRAVENOUS | Status: AC
  Administered 2015-04-21: 500 mL via INTRAVENOUS

## 2015-04-21 MED ORDER — LIDOCAINE HCL (PF) 1 % IJ SOLN
INTRAMUSCULAR | Status: DC | PRN
  Administered 2015-04-21 (×2): 4 mL

## 2015-04-21 MED ORDER — LACTATED RINGERS IV SOLN
500.0000 mL | INTRAVENOUS | Status: DC | PRN
  Administered 2015-04-21: 500 mL via INTRAVENOUS

## 2015-04-21 MED ORDER — FENTANYL 2.5 MCG/ML BUPIVACAINE 1/10 % EPIDURAL INFUSION (WH - ANES)
14.0000 mL/h | INTRAMUSCULAR | Status: DC | PRN
  Administered 2015-04-21: 14 mL/h via EPIDURAL
  Administered 2015-04-21: 12 mL/h via EPIDURAL
  Filled 2015-04-21 (×2): qty 125

## 2015-04-21 MED ORDER — TERBUTALINE SULFATE 1 MG/ML IJ SOLN
0.2500 mg | Freq: Once | INTRAMUSCULAR | Status: DC | PRN
  Filled 2015-04-21: qty 1

## 2015-04-21 MED ORDER — LACTATED RINGERS IV SOLN
INTRAVENOUS | Status: DC
  Administered 2015-04-21 (×2): via INTRAVENOUS

## 2015-04-21 MED ORDER — ONDANSETRON HCL 4 MG/2ML IJ SOLN
4.0000 mg | Freq: Four times a day (QID) | INTRAMUSCULAR | Status: DC | PRN
  Administered 2015-04-21: 4 mg via INTRAVENOUS
  Filled 2015-04-21: qty 2

## 2015-04-21 MED ORDER — ACETAMINOPHEN 325 MG PO TABS: 650.0000 mg | ORAL_TABLET | ORAL | Status: DC | PRN

## 2015-04-21 MED ORDER — LACTATED RINGERS IV SOLN
1.0000 m[IU]/min | INTRAVENOUS | Status: DC
  Filled 2015-04-21: qty 10

## 2015-04-21 MED ORDER — CITRIC ACID-SODIUM CITRATE 334-500 MG/5ML PO SOLN: 30.0000 mL | ORAL | Status: DC | PRN

## 2015-04-21 MED ORDER — LIDOCAINE HCL (PF) 1 % IJ SOLN
30.0000 mL | INTRAMUSCULAR | Status: AC | PRN
  Administered 2015-04-22: 30 mL via SUBCUTANEOUS
  Filled 2015-04-21: qty 30

## 2015-04-21 MED ORDER — LACTATED RINGERS IV SOLN
2.5000 [IU]/h | INTRAVENOUS | Status: DC
  Administered 2015-04-22: 2.5 [IU]/h via INTRAVENOUS

## 2015-04-21 MED ORDER — OXYTOCIN 10 UNIT/ML IJ SOLN
1.0000 m[IU]/min | INTRAVENOUS | Status: DC
  Administered 2015-04-21: 1 m[IU]/min via INTRAVENOUS

## 2015-04-21 NOTE — Anesthesia Procedure Notes (Signed)
Epidural Patient location during procedure: OB  Staffing Anesthesiologist: Sondra Blixt Performed by: anesthesiologist   Preanesthetic Checklist Completed: patient identified, site marked, surgical consent, pre-op evaluation, timeout performed, IV checked, risks and benefits discussed and monitors and equipment checked  Epidural Patient position: sitting Prep: site prepped and draped and DuraPrep Patient monitoring: continuous pulse ox and blood pressure Approach: midline Location: L3-L4 Injection technique: LOR saline  Needle:  Needle type: Tuohy  Needle gauge: 17 G Needle length: 9 cm and 9 Needle insertion depth: 6 cm Catheter type: closed end flexible Catheter size: 19 Gauge Catheter at skin depth: 10 cm Test dose: negative  Assessment Events: blood not aspirated, injection not painful, no injection resistance, negative IV test and no paresthesia  Additional Notes Patient identified. Risks/Benefits/Options discussed with patient including but not limited to bleeding, infection, nerve damage, paralysis, failed block, incomplete pain control, headache, blood pressure changes, nausea, vomiting, reactions to medication both or allergic, itching and postpartum back pain. Confirmed with bedside nurse the patient's most recent platelet count. Confirmed with patient that they are not currently taking any anticoagulation, have any bleeding history or any family history of bleeding disorders. Patient expressed understanding and wished to proceed. All questions were answered. Sterile technique was used throughout the entire procedure. Please see nursing notes for vital signs. Test dose was given through epidural catheter and negative prior to continuing to dose epidural or start infusion. Warning signs of high block given to the patient including shortness of breath, tingling/numbness in hands, complete motor block, or any concerning symptoms with instructions to call for help. Patient was  given instructions on fall risk and not to get out of bed. All questions and concerns addressed with instructions to call with any issues or inadequate analgesia.    

## 2015-04-21 NOTE — Progress Notes (Signed)
Notified of pt arrival in MAU. CNM in delivery. RN to check rupture status and cervix and call CNM back

## 2015-04-21 NOTE — H&P (Signed)
[name] [name] is a 35 y.o. female, G2 P1001 at 41.4 weeks SROM TOLAC  Patient Active Problem List   Diagnosis Date Noted  . Pregnancy 10/18/2014  . Left ankle sprain 07/01/2014  . GAD (generalized anxiety disorder) 06/22/2014  . PTSD (post-traumatic stress disorder) 06/22/2014  . Acute upper respiratory infection 04/26/2014  . Binge eating disorder 04/21/2014  . GERD (gastroesophageal reflux disease) 04/21/2014  . Acne vulgaris 01/11/2014  . Stress due to illness of family member 11/29/2013  . Obesity (BMI 30.0-34.9) 01/12/2013  . Malpositioned IUD (HCC) 04/01/2012  . Anemia 01/31/2012  . History of abnormal Pap smear 08/13/2011  . History of depression 08/13/2011  . Migraines 03/24/2011    Pregnancy Course: Patient entered care at 8.2 weeks.   EDC of 04/10/15 was established by [place].      [place] evaluations:   13.2 weeks - Dating: S=D, fhr 154, anterior placenta  19.4 weeks - Anatomy: EFW 11oz - 66%, FHR 140, vertex, anterior placenta,      21.2 weeks - FU: breech, EFW 431g - 50%, cervix closed, no previa,  Significant prenatal events:   CS x1   Last evaluation:   40.3 weeks   VE:2/90/-3  Reason for admission:  SROM  Pt States:   Contractions Frequency: 3         Contraction severity: strong         Fetal activity: +FM  OB History    Gravida Para Term Preterm AB TAB SAB Ectopic Multiple Living   0 0 0 0 0 0 1     Past Medical History  Diagnosis Date  . Migraines     MIGRAINES;HAS TAKEN MAXALT AND VICODIN IN THE PAST OR CAFFEINE  . Depression     HAS TAKEN LEXAPRO  DAILY BEFORE PREGNANCY;WEANED SELF OFF  . Infection     YEAST INF;TYPICALLY AFTER ANTIBXS;NOT FREQ  . Abnormal Pap smear 2005    PRE-CANCEROUS CELLS ON CX;COLPO DONE AND CELLS "BURNED OFF";LAST PAP 2010 OR 2011,WAS NORMAL  . Anxiety   . GAD (generalized anxiety disorder) 06/22/2014  . PTSD (post-traumatic stress disorder) 06/22/2014  . Vaginal Pap smear, abnormal    Past Surgical History   Procedure Laterality Date  . Wisdom tooth extraction  1998    ALL 4 EXTRACTED  . Colposcopy    . Cryotherapy    . Cesarean section  01/28/2012    Procedure: CESAREAN SECTION;  Surgeon: Esmeralda Arthur, MD;  Location: [facility];  Service: Obstetrics;  Laterality: N/A;  . Laparoscopic appendectomy N/A 04/01/2012    Procedure: APPENDECTOMY LAPAROSCOPIC;  Surgeon: Ardeth Sportsman, MD;  Location: [facility];  Service: General;  Laterality: N/A;  . Appendectomy     Family History: family history includes Alzheimer's disease in her father; Breast cancer in her maternal grandmother and paternal aunt; Cancer in her maternal grandmother and paternal aunt; Migraines in her father and paternal grandfather; Thyroid disease in her mother. Social History:  reports that she has never smoked. She has never used smokeless tobacco. She reports that she drinks about 0.6 oz of alcohol per week. She reports that she does not use illicit drugs.   Prenatal Transfer Tool  Maternal Diabetes: No Genetic Screening: Declined Maternal Ultrasounds/Referrals: Normal Fetal Ultrasounds or other Referrals:  None Maternal Substance Abuse:  No Significant Maternal Medications:  None Significant Maternal Lab Results: Lab values include: Group B Strep negative   ROS:  See HPI above, all other systems are negative  Allergies  Allergen Reactions  . Wellbutrin [Bupropion] Other (See Comments)    Made pt angry.     Dilation: 2 Effacement (%): 60 Station: -2 Exam by:: Sharen Hint RNC Blood pressure 132/93, pulse 92, temperature 98.3 F (36.8 C), temperature source Oral, resp. rate 20, last menstrual period 06/20/2014.  Maternal Exam:  Uterine Assessment: Contraction frequency is rare.  Abdomen: Gravid, non tender. Fundal height is aga.  Normal external genitalia, vulva, cervix, uterus and adnexa.  No lesions noted on exam.  Pelvis adequate for delivery.  Fetal presentation: Vertex by Leopold's   Fetal Exam:   Monitor Surveillance : Continuous Monitoring Mode: Ultrasound.  NICHD: Category 1 CTXs: Q 3-52minutes EFW   7.5 lbs  Physical Exam: Nursing note and vitals reviewed General: alert and cooperative She appears well nourished Psychiatric: Normal mood and affect. Her behavior is normal Head: Normocephalic Eyes: Pupils are equal, round, and reactive to light Neck: Normal range of motion Cardiovascular: RRR without murmur  Respiratory: CTAB. Effort normal  Abd: soft, non-tender, +BS, no rebound, no guarding  Genitourinary: Vagina normal  Neurological: A&Ox3 Skin: Warm and dry  Musculoskeletal: Normal range of motion  Homan's sign negative bilaterally No evidence of DVTs.  Edema: Minimal bilaterally non-pitting edema DTR: 2+ Clonus: None   Prenatal labs: ABO, Rh:  O positive Antibody:  neg Rubella:  immune RPR:   NR HBsAg:   neg HIV:   NR GBS:  negative Sickle cell/Hgb electrophoresis:  WNL Pap:  wnl 09/28/14 GC:   neg Chlamydia: neg Genetic screenings:   Glucola:  wnl   Assessment:  IUP at 40.5 weeks NICHD: Category  Membranes: SROM x 2hrs GBS negative  Plan:  Admit to L&D for expectant/active management of labor. Possible augmentation options reviewed including pitocin.  IV pain medication per orders PRN Epidural per patient request Foley cath after patient is comfortable with epidural Anticipate SVD  Labor mgmt as ordered    Attending MD available at all times.  Laritza Vokes, CNM, MSN 04/21/2015, 5:53 AM

## 2015-04-21 NOTE — Consults (Signed)
04/21/2015   CRNA epidural pain rounds. Patient reports she has a pain score of 1 with a goal of 3 or less.  Discomfort is from pressure sensations. I explained epidural will work well for pain but will not take all the pressure sensations away and patient verbalized understanding.  Encouraged her to notify RN if epidural is not working well and if bolus does not work after 20 minutes to ask the nurse to call the anesthesiologist.

## 2015-04-21 NOTE — Progress Notes (Addendum)
Labor Progress  Subjective: No complaints.    Objective: BP 91/74 mmHg  Pulse 79  Temp(Src) 98.5 F (36.9 C) (Oral)  Resp 16  Ht  (1.499 m)  Wt 194 lb (87.998 kg)  BMI 39.16 kg/m2  SpO2 98%  LMP 06/20/2014 I/O last 3 completed shifts: In: -  Out: 560 [Urine:560]   FHT: 150, moderate variability, + accel, no decel CTX:  regular, every 3-4 minutes Uterus gravid, soft non tender SVE:  Dilation: 9 Effacement (%): 90 Station: 0 Exam by:: Tyriq Moragne CNM  Pitocin at 76mUn/min  Assessment:  IUP at 41.5 weeks NICHD: Category 1 Membranes:  SROM x 16hrs, no s/s of infection Labor progress: transition Pitocin Augmentation GBS:neg Epidural FSE/IUPC  Plan: Continue labor plan Continuous monitoring Frequent position changes to facilitate fetal rotation and descent. Will reassess with cervical exam at 2000 or earlier if necessary Continue pitocin per protocol     [name] [name], CNM, MSN 04/21/2015. 8:13 PM

## 2015-04-21 NOTE — Progress Notes (Signed)
Subjective: In to meet the acquaintance of pt and husband.  Doing well. Comfortable s/p edidural.  Questions regarding plan of care and trial of labor.   Objective: BP 103/73 mmHg  Pulse 95  Temp(Src) 98.3 F (36.8 C) (Oral)  Resp 18  Ht  (1.499 m)  Wt 87.998 kg (194 lb)  BMI 39.16 kg/m2  SpO2 98%  LMP 06/20/2014      FHT: Category 1, 145 bpm, moderate variability, +accels, occasional early decels,   ( Note: at 8119-1478 - picking up maternal heart rate due to maternal position change, sitting straight up)  UC:   regular, every 2-4 minutes SVE:   Dilation: 3 Effacement (%): 90 Station: -1 Exam by:: R. Tammra Pressman, CNM Membranes:  SROM  At 0300 Internal monitor: IUPC placement without difficulty Augmentation:   None   Pain management:   Epidural    Assessment:  IUP at 40.5 weeks Early Labor NICHD: Category  Membranes: SROM x 5.5hrs IUPC  GBS negative  Plan: Expectant management  Anticipate SVD  Alphonzo Severance CNM, [place] 04/21/2015, 8:00 AM

## 2015-04-21 NOTE — Progress Notes (Signed)
Notified of pt positive fern and cervical exam. Will put in admission orders for labor and delivery

## 2015-04-21 NOTE — Progress Notes (Signed)
Subjective: Doing well, feeling more pressure.   Objective: BP 96/65 mmHg  Pulse 81  Temp(Src) 98.9 F (37.2 C) (Oral)  Resp 16  Ht  (1.499 m)  Wt 87.998 kg (194 lb)  BMI 39.16 kg/m2  SpO2 98%  LMP 06/20/2014    Filed Vitals:   04/21/15 1059 04/21/15 1155 04/21/15 1305 04/21/15 1411  BP: 110/66 104/64 99/75 96/65   Pulse: 107 79 91 81  Temp: 98.7 F (37.1 C)  100 F (37.8 C) 98.9 F (37.2 C)  TempSrc: Oral  Oral Oral  Resp: Height:      Weight:      SpO2:          FHT: Category 1, 155 pm, moderate variability, +accels UC:   regular, every 2-4 minutes SVE:   Dilation: 4.5 Effacement (%): 90 Station: -1 Exam by:: Lakeeta Dobosz, CNM Membranes: SROM At 0300 Internal monitor: IUPC , MVUs 15 Augmentation:none at present  Pain management: Epidural  Assessment:  IUP at 40.5 weeks Early Labor NICHD: Category 1  Membranes: SROM x 12 hrs Elevated maternal Temp, 99/100F Inadequate contraction strength,  Mvus 150  GBS negative   Plan: -Augmentation of labor with pitocin,  1 x2 mu/min -  pitocin augmentation discussed with family- Pt voiced understanding and agreeable to augmentation  Anticipate SVD  Alphonzo Severance CNM, [place] 04/21/2015, 2:54 PM

## 2015-04-21 NOTE — Progress Notes (Signed)
Subjective: Coping well, comfortable with epidural, husband bedside for support  Objective: BP 110/66 mmHg  Pulse 107  Temp(Src) 98.7 F (37.1 C) (Oral)  Resp 18  Ht  (1.499 m)  Wt 87.998 kg (194 lb)  BMI 39.16 kg/m2  SpO2 98%  LMP 06/20/2014     Filed Vitals:   04/21/15 0802 04/21/15 0916 04/21/15 1012 04/21/15 1059  BP: 107/81 105/60 104/57 110/66  Pulse: 90 79 76 107  Temp:  98.4 F (36.9 C)  98.7 F (37.1 C)  TempSrc:  Oral  Oral  Resp: Height:      Weight:      SpO2:       Results for orders placed or performed during the hospital encounter of 04/21/15 (from the past 24 hour(s))  OB RESULT CONSOLE Group B Strep     Status: None   Collection Time: 04/21/15 12:00 AM  Result Value Ref Range   GBS Negative   OB RESULTS CONSOLE GC/Chlamydia     Status: None   Collection Time: 04/21/15 12:00 AM  Result Value Ref Range   Gonorrhea Negative    Chlamydia Negative   OB RESULTS CONSOLE RPR     Status: None   Collection Time: 04/21/15 12:00 AM  Result Value Ref Range   RPR Nonreactive   OB RESULTS CONSOLE HIV antibody     Status: None   Collection Time: 04/21/15 12:00 AM  Result Value Ref Range   HIV Non-reactive   Fern Test     Status: Abnormal   Collection Time: 04/21/15  5:48 AM  Result Value Ref Range   POCT Fern Test Positive = ruptured amniotic membanes   Type and screen [facility]     Status: None   Collection Time: 04/21/15  6:00 AM  Result Value Ref Range   ABO/RH(D) O POS    Antibody Screen NEG    Sample Expiration 04/24/2015   CBC     Status: Abnormal   Collection Time: 04/21/15  6:04 AM  Result Value Ref Range   WBC 14.6 (H) 4.0 - 10.5 K/uL   RBC 4.28 3.87 - 5.11 MIL/uL   Hemoglobin 11.3 (L) 12.0 - 15.0 g/dL   HCT 40.9 (L) 81.1 - 91.4 %   MCV 82.0 78.0 - 100.0 fL   MCH 26.4 26.0 - 34.0 pg   MCHC 32.2 30.0 - 36.0 g/dL   RDW 78.2 95.6 - 21.3 %   Platelets 217 150 - 400 K/uL      FHT: Category 1, 150-155  BL, moderate variability, +Accels UC:   regular, every 2-4 minutes SVE:   Dilation: 3.5 Effacement (%): 90 Station: -1 Exam by:: Kenyata Guess, CNM Membranes: SROM At 0300 Internal monitor: IUPC , MVUs 160 Augmentation: None   Pain management: Epidural   Assessment:  IUP at 40.5 weeks Early Labor NICHD: Category  Membranes: SROM x 8.5 hrs IUPC  Inadequate contraction strength Cat 1 FHT GBS negative  Plan: At present - Expectant management  Continue to monitor for contraction strength  If contraction strength inadequate and no cervical change, may consider augmentation with pitocin R/B pitocin augmentation discussed with family-  Pt voiced understanding and agreeable to augmentation if needed Anticipate SVD  Alphonzo Severance CNM, [place] 04/21/2015, 11:05 AM

## 2015-04-21 NOTE — MAU Note (Signed)
Pt presents complaining of SROM at 0300 and contractions every 2 minutes. Denies bleeding. 2cm in office yesterday

## 2015-04-21 NOTE — Anesthesia Preprocedure Evaluation (Signed)
Anesthesia Evaluation  Patient identified by MRN, date of birth, ID band Patient awake    Reviewed: Allergy & Precautions, NPO status , Patient's Chart, lab work & pertinent test results  History of Anesthesia Complications Negative for: history of anesthetic complications  Airway Mallampati: II  TM Distance: >3 FB Neck ROM: Full    Dental no notable dental hx. (+) Dental Advisory Given   Pulmonary neg pulmonary ROS,    Pulmonary exam normal breath sounds clear to auscultation       Cardiovascular negative cardio ROS Normal cardiovascular exam Rhythm:Regular Rate:Normal     Neuro/Psych  Headaches, PSYCHIATRIC DISORDERS Anxiety Depression    GI/Hepatic Neg liver ROS, GERD  Medicated and Controlled,  Endo/Other  Morbid obesity  Renal/GU negative Renal ROS  negative genitourinary   Musculoskeletal negative musculoskeletal ROS (+)   Abdominal   Peds negative pediatric ROS (+)  Hematology negative hematology ROS (+)   Anesthesia Other Findings   Reproductive/Obstetrics (+) Pregnancy                             Anesthesia Physical Anesthesia Plan  ASA: II  Anesthesia Plan: Epidural   Post-op Pain Management:    Induction:   Airway Management Planned:   Additional Equipment:   Intra-op Plan:   Post-operative Plan:   Informed Consent: I have reviewed the patients History and Physical, chart, labs and discussed the procedure including the risks, benefits and alternatives for the proposed anesthesia with the patient or authorized representative who has indicated his/her understanding and acceptance.   Dental advisory given  Plan Discussed with: CRNA  Anesthesia Plan Comments:         Anesthesia Quick Evaluation

## 2015-04-21 NOTE — Progress Notes (Signed)
Subjective: Doing well.  Husband  And friend  bedside for support.  Objective: BP 129/69 mmHg  Pulse 101  Temp(Src) 98.6 F (37 C) (Oral)  Resp 17  Ht  (1.499 m)  Wt 87.998 kg (194 lb)  BMI 39.16 kg/m2  SpO2 98%  LMP 06/20/2014       FHT: Category 1, 145 bpm, moderate variability, +accels UC:   regular, every 2-3 minutes SVE:   Dilation: 4.5 Effacement (%): 90 Station: -1 Exam by:: Cormick Moss, CNM Membranes: SROM At 0300 Internal monitor: IUPC , MVUs 160 Augmentation:Pitocin 5 mu/min  Pain management: Epidural   Assessment:  IUP at 40.5 weeks Early Labor Augmentation with Pitocin Inadequate contraction strength, Mvus 160  NICHD: Category 1  Membranes: SROM x 13.5 hrs GBS negative   Plan: Watch maternal temp Continue current management as ordered   Alphonzo Severance CNM, [place] 04/21/2015, 5:14 PM

## 2015-04-22 ENCOUNTER — Encounter (HOSPITAL_COMMUNITY): Payer: Self-pay | Admitting: *Deleted

## 2015-04-22 LAB — CBC
HEMATOCRIT: 27.4 % — AB (ref 36.0–46.0)
Hemoglobin: 9 g/dL — ABNORMAL LOW (ref 12.0–15.0)
MCH: 26 pg (ref 26.0–34.0)
MCHC: 32.1 g/dL (ref 30.0–36.0)
MCV: 81.1 fL (ref 78.0–100.0)
Platelets: 190 10*3/uL (ref 150–400)
RBC: 3.38 MIL/uL — AB (ref 3.87–5.11)
RDW: 15.6 % — AB (ref 11.5–15.5)
WBC: 23.3 10*3/uL — AB (ref 4.0–10.5)

## 2015-04-22 MED ORDER — WITCH HAZEL-GLYCERIN EX PADS: 1.0000 "application " | MEDICATED_PAD | CUTANEOUS | Status: DC | PRN

## 2015-04-22 MED ORDER — TETANUS-DIPHTH-ACELL PERTUSSIS 5-2.5-18.5 LF-MCG/0.5 IM SUSP: 0.5000 mL | Freq: Once | INTRAMUSCULAR | Status: DC

## 2015-04-22 MED ORDER — IBUPROFEN 600 MG PO TABS
600.0000 mg | ORAL_TABLET | Freq: Four times a day (QID) | ORAL | Status: DC
  Administered 2015-04-22 – 2015-04-24 (×10): 600 mg via ORAL
  Filled 2015-04-22 (×10): qty 1

## 2015-04-22 MED ORDER — SIMETHICONE 80 MG PO CHEW: 80.0000 mg | CHEWABLE_TABLET | ORAL | Status: DC | PRN

## 2015-04-22 MED ORDER — DIPHENHYDRAMINE HCL 25 MG PO CAPS: 25.0000 mg | ORAL_CAPSULE | Freq: Four times a day (QID) | ORAL | Status: DC | PRN

## 2015-04-22 MED ORDER — ZOLPIDEM TARTRATE 5 MG PO TABS
5.0000 mg | ORAL_TABLET | Freq: Every evening | ORAL | Status: DC | PRN
Start: 2015-04-22 — End: 2015-04-24

## 2015-04-22 MED ORDER — SODIUM CHLORIDE 0.9 % IV SOLN
3.0000 g | Freq: Four times a day (QID) | INTRAVENOUS | Status: AC
  Administered 2015-04-22 (×4): 3 g via INTRAVENOUS
  Filled 2015-04-22 (×5): qty 3

## 2015-04-22 MED ORDER — ACETAMINOPHEN 325 MG PO TABS
650.0000 mg | ORAL_TABLET | ORAL | Status: DC | PRN
  Administered 2015-04-22 – 2015-04-23 (×3): 650 mg via ORAL
  Filled 2015-04-22 (×4): qty 2

## 2015-04-22 MED ORDER — ONDANSETRON HCL 4 MG PO TABS: 4.0000 mg | ORAL_TABLET | ORAL | Status: DC | PRN

## 2015-04-22 MED ORDER — BENZOCAINE-MENTHOL 20-0.5 % EX AERO
1.0000 "application " | INHALATION_SPRAY | CUTANEOUS | Status: DC | PRN
  Administered 2015-04-22: 1 via TOPICAL
  Filled 2015-04-22: qty 56

## 2015-04-22 MED ORDER — PRENATAL MULTIVITAMIN CH
1.0000 | ORAL_TABLET | Freq: Every day | ORAL | Status: DC
  Administered 2015-04-22 – 2015-04-24 (×3): 1 via ORAL
  Filled 2015-04-22 (×3): qty 1

## 2015-04-22 MED ORDER — LANOLIN HYDROUS EX OINT: TOPICAL_OINTMENT | CUTANEOUS | Status: DC | PRN

## 2015-04-22 MED ORDER — DIBUCAINE 1 % RE OINT: 1.0000 "application " | TOPICAL_OINTMENT | RECTAL | Status: DC | PRN

## 2015-04-22 MED ORDER — ONDANSETRON HCL 4 MG/2ML IJ SOLN: 4.0000 mg | INTRAMUSCULAR | Status: DC | PRN

## 2015-04-22 MED ORDER — SENNOSIDES-DOCUSATE SODIUM 8.6-50 MG PO TABS
2.0000 | ORAL_TABLET | ORAL | Status: DC
  Administered 2015-04-22 – 2015-04-23 (×2): 2 via ORAL
  Filled 2015-04-22 (×2): qty 2

## 2015-04-22 NOTE — Anesthesia Postprocedure Evaluation (Signed)
Anesthesia Post Note  Patient: [name] [name]  Procedure(s) Performed: * No procedures listed *  Patient location during evaluation: Mother Baby Anesthesia Type: Epidural Level of consciousness: awake Pain management: pain level controlled (2 pain score) Vital Signs Assessment: post-procedure vital signs reviewed and stable Respiratory status: spontaneous breathing Cardiovascular status: stable Postop Assessment: no headache, no backache, epidural receding and patient able to bend at knees Anesthetic complications: no    Last Vitals:  Filed Vitals:   04/22/15 0300 04/22/15 0408  BP: 112/51 118/59  Pulse: 94 96  Temp: 37 C 37.6 C  Resp: 18 16    Last Pain:  Filed Vitals:   04/22/15 0814  PainSc: 3                  Edison PaceWILKERSON,[place] Espina

## 2015-04-22 NOTE — Progress Notes (Signed)
Subjective: Postpartum Day 1: Vaginal delivery, 2nd degree and left labial laceration Patient up ad lib, reports no syncope or dizziness. Pain managed by Ibuprofen Feeding:  Breast Contraceptive plan:  Mirena  Objective: Vital signs in last 24 hours: Temp:  [98.5 F (36.9 C)-101.4 F (38.6 C)] 98.9 F (37.2 C) (03/03 0933) Pulse Rate:  [25-189] 96 (03/03 0933) Resp:  [16-19] 16 (03/03 0933) BP: (84-130)/(48-84) 118/67 mmHg (03/03 0933) SpO2:  [76 %-100 %] 98 % (03/03 0933)  Filed Vitals:   04/22/15 0217 04/22/15 0300 04/22/15 0408 04/22/15 0933  BP: 107/77 112/51 118/59 118/67  Pulse: 135 94 96 96  Temp:  98.6 F (37 C) 99.7 F (37.6 C) 98.9 F (37.2 C)  TempSrc:  Oral Oral Axillary  Resp:  18 16 16   Height:      Weight:      SpO2:  97% 98% 98%    Physical Exam:  General: alert and cooperative Lochia: appropriate Uterine Fundus: firm Perineum: healing well DVT Evaluation: No evidence of DVT seen on physical exam. Negative Homan's sign.   CBC Latest Ref Rng 04/22/2015 04/21/2015 02/28/2015  WBC 4.0 - 10.5 K/uL 23.3(H) 14.6(H) 11.9(H)  Hemoglobin 12.0 - 15.0 g/dL 9.0(L) 11.3(L) 11.6(L)  Hematocrit 36.0 - 46.0 % 27.4(L) 35.1(L) 34.9(L)  Platelets 150 - 400 K/uL 190 217 189    Assessment/Plan: Status post vaginal delivery day 1. Afebrile, on Unasyn 24 hrs PP  Stable Continue current care. Breastfeeding   Beatrix FettersRachel StallCNM 04/22/2015, 12:30 PM

## 2015-04-22 NOTE — Lactation Note (Signed)
This note was copied from a baby's chart. Lactation Consultation Note  Patient Name: [name] [name] YNWGN'FToday's Date: 04/22/2015 Reason for consult: Initial assessment;Breast/nipple pain   Initial consult with mom of 9 hour old infant. Infant has been cluster feeding since birth per mom. Infant was cueing to feed. Infant noted to have a small mouth and an anterior frenulum is noted. She is able to suckle well on gloved finger and extend tongue over gumline. Mom with large breasts and compressible tissue, nipples are everted and reddened. Right nipple has bruising noted also. No breakdown of nipple tissue noted. Mom reports there has been pain/pinching with most feedings.   Assisted mom in latching infant to left breast in football hold. Infant does not open mouth wide, encouraged mom to wait for wide open mouth for latch and pull infant on quickly. Infant noted to have flanged lips and rhythmic sucks and intermittent swallows. Infant nursed about 15 minutes and self detached. Showed mom how to hand express, we were able to obtain 2 cc which was fed to infant via spoon. She tolerated spoon feeding well. She then continued to cue, Assisted mom in latching infant to right breast in cross cradle hold. Infant noted to have lips curled in, showed parents how to flange lips. Mom reported that feeding is not painful this time. Enc mom to relatch infant any time there is pain/pinching. Infant noted to have rhythmic sucks and intermittent swallows. Both nipples were round when infant self detached. Mom reports sometimes nipples are compressed and blanched after feeding. We discussed using support pillows, positioning, and how to obtain deep latch for better milk transfer.  LC Brochure given, discussed LC phone #, BF Support Groups and OP Services. Mom reports she was unable together first child to latch and she pumped and bottle fed for 2 months. She reports she really wants to BF this infant. Enc mom to call with  questions/concerns.  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 Will follow up tomorrow and prn. Report to mom's nurse Milagros Reaponna Esker, RN.     Maternal Data Has patient been taught Hand Expression?: Yes Does the patient have breastfeeding experience prior to this delivery?: Yes  Feeding Feeding Type: Breast Fed Length of feed: 25 min  LATCH Score/Interventions Latch: Grasps breast easily, tongue down, lips flanged, rhythmical sucking. Intervention(s): Adjust position;Assist with latch;Breast massage;Breast compression  Audible Swallowing: A few with stimulation  Type of Nipple: Everted at rest and after stimulation  Comfort (Breast/Nipple): Filling, red/small blisters or bruises, mild/mod discomfort  Problem noted: Mild/Moderate discomfort Interventions (Mild/moderate discomfort): Hand expression;Comfort gels  Hold (Positioning): Assistance needed to correctly position infant at breast and maintain latch.  LATCH Score: 7  Lactation Tools Discussed/Used WIC Program: No   Consult Status Consult Status: Follow-up Date: 04/23/15 Follow-up type: In-patient    Silas FloodSharon S Hice 04/22/2015, 11:20 AM

## 2015-04-22 NOTE — Consult Note (Signed)
Neonatology Note:   Attendance at delivery:    I was asked by Venous Standard, CNM to attend this term vaginal delivery due to meconium and decels. The mother is a 34 y.o. female, G2 P1001 at 41.4 weeks, GBS negative. ROM 04/21/15 at 0300. Meconium fluid. Infant with fair spontaneous cry and tone. Brought to warmer and dried and stimulated.  HR >100.  Bulb suctioned oropharynx and copious meconium fluid removed.  Improved tone and cry.  Lungs clear to auscultation bilateral.  Ap 6/9. Parents updated. To CN to care of Pediatrician.  Leeam Cedrone, MD  

## 2015-04-22 NOTE — Lactation Note (Signed)
This note was copied from a baby's chart. Lactation Consultation Note  Patient Name: [name] [name] [name]: 04/22/2015 Reason for consult: Follow-up assessment Baby at 18 hr of life and mom is reporting nipple pain. She does not think that she can position baby well on her own. Her L nipple is red on the tip, R nipple is red and has a small blister on the tip. Mom has large compressible breasts. Baby has a noticeable anterior lingual frenulum, a heart shape tongue tip, she can not lift tongue to midline, she can extend tongue past gum ridge but pulls tongue back in after 10 sucks and has tremors of the tongue. Baby clicks when she latches without the NS. She also has poor lateralization of the tongue. Placed a rolled up wash cloth under mom's breast, positioned on the breast friend pillow to the side, and had mom use the foot ball position to latch baby. Mom was able to get a deeper latch that was less pain with the #16 NS. Mom needs lots of encouragement and praise. She will bf baby on demand and call for help if she is unable to latch baby correctly.     Maternal Data    Feeding Feeding Type: Breast Fed Length of feed: 15 min  LATCH Score/Interventions Latch: Grasps breast easily, tongue down, lips flanged, rhythmical sucking. Intervention(s): Adjust position;Breast compression;Assist with latch  Audible Swallowing: A few with stimulation Intervention(s): Hand expression;Alternate breast massage  Type of Nipple: Everted at rest and after stimulation  Comfort (Breast/Nipple): Filling, red/small blisters or bruises, mild/mod discomfort  Problem noted: Cracked, bleeding, blisters, bruises;Mild/Moderate discomfort Interventions  (Cracked/bleeding/bruising/blister): Expressed breast milk to nipple Interventions (Mild/moderate discomfort): Comfort gels  Hold (Positioning): Assistance needed to correctly position infant at breast and maintain latch. Intervention(s): Support  Pillows;Position options  LATCH Score: 7  Lactation Tools Discussed/Used Pump Review: Setup, frequency, and cleaning;Milk Storage Initiated by:: ES Date initiated:: 04/22/15   Consult Status Consult Status: Follow-up Date: 04/23/15 Follow-up type: In-patient    [name] 04/22/2015, 6:55 PM

## 2015-04-23 NOTE — Progress Notes (Signed)
[name] [name]   Subjective: Post Partum Day 1 Vaginal delivery, 2 degree laceration Patient up ad lib, denies syncope or dizziness. Reports consuming regular diet without issues and denies N/V No issues with urination and reports bleeding is appropriate  Feeding:  breast Contraceptive plan:   unsure  Objective: Temp:  [98.5 F (36.9 C)] 98.5 F (36.9 C) (03/03 1828) Pulse Rate:  [79-89] 79 (03/04 0636) Resp:  [16-18] 18 (03/04 0636) BP: (103-122)/(68-83) 103/68 mmHg (03/04 0636)  Physical Exam:  General: alert and cooperative Ext: WNL, no significant  edema. No evidence of DVT seen on physical exam. Breast: Soft filling Lungs: CTAB Heart RRR without murmur  Abdomen:  Soft, fundus firm, lochia scant, + bowel sounds, non distended, non tender Lochia: appropriate Uterine Fundus: firm Laceration: healing well    Recent Labs  04/21/15 0604 04/22/15 0607  HGB 11.3* 9.0*  HCT 35.1* 27.4*    Assessment S/P Vaginal Delivery-Day 1 Stable  Normal Involution Breastfeeding   Plan: Continue current care Possible Dc later today  Breastfeeding and Lactation consult Lactation support   Yarlin Breisch, CNM, MSN 04/23/2015, 11:04 AM

## 2015-04-23 NOTE — Clinical Social Work Maternal (Signed)
  CLINICAL SOCIAL WORK MATERNAL/CHILD NOTE  Patient Details  Name: [name] [name] MRN: [id number] Date of Birth: 1980-02-23  Date:  04/23/2015  Clinical Social Worker Initiating Note:  Norlene Duel, LCSW Date/ Time Initiated:  04/23/15/1430     Child's Name:  [name] [name]   Legal Guardian:   (Parents [name] [name] and [name] [name])   Need for Interpreter:  None   Date of Referral:  04/22/15     Reason for Referral:  Other (Comment)   Referral Source:  [facility]   Address:  [address] [place], [place] [address]  Phone number:   [phone])   Household Members:  Spouse, Minor Children   Natural Supports (not living in the home):  Extended Family, Immediate Family   Professional Supports: None   Employment:  (Both parents are employed)   Type of Work:     Education:      Pensions consultant:  Multimedia programmer   Other Resources:      Cultural/Religious Considerations Which May Impact Care:  none noted  Strengths:  Ability to meet basic needs , Home prepared for child    Risk Factors/Current Problems:   (Hx of anxiety and depression)   Cognitive State:  Alert , Able to Concentrate    Mood/Affect:  Happy    CSW Assessment:  Acknowledged order for social work consult to assess mother's hx of Depression. Met with mother who was pleasant and receptive to social work. [name] is married with one other dependent age 33.  She reports hx of depression and states that she was on lexapro three years ago and within the last year tried on another antidepressant, but was not satisfied with the results.  "I realize that I do better without medication".   She also reportedly participate in brief therapy.   She reports no current acute symptoms of anxiety or depression.  "Since the delivery, I've been a little emotional, but that is to be expected".     She denies any use of alcohol or illicit drug use during pregnancy. No acute social concerns noted or reported at this  time. She reports having an excellent support system. 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   CSW Plan/Description:     Mother aware of signs/symptoms of PP Depression and available resources  No further intervention required  No barriers to discharge     [name] [name] J, LCSW 04/23/2015, 3:17 PM

## 2015-04-23 NOTE — Lactation Note (Signed)
This note was copied from a baby's chart. Lactation Consultation Note  Patient Name: [name] [name] JXBJY'NToday's Date: 04/23/2015 Reason for consult: Follow-up assessment;Breast/nipple pain;Difficult latch   Follow up with mom of 46 hour old infant. Infant with 4 BF for 10-20 minutes using NS. Infant with 11 finger feedings of formula of 10-20 cc, she has also received 3 EBM of 1-3 cc. She has had 9 voids and 4 stools in last 24 hours. Mom has been finger feeding infant due to nipple pain and trauma. Mom reports she is pumping about every 4 hours, enc her to pump every 2-3 hours for 15 minutes. Mom is getting 1-3 cc EBM with pumping. Infant being finger fed using curved tip syringe.   Mom reports that taking the comfort gels off is painful. Gave her Breast Shells with instructions for use. Mom also says that it is getting harder to finger feed infant because of needing 2 hands, gave them 5 fr feeding tube and syringe with instructions for use for feedings.   Parents did speak to Ped in regards to tongue restriction, Mom is unsure if she would have the infant's tongue clipped at this time. Gave them Tongue Tie Resources Handout. Enc parents [facility]. D[facility] with [facility] that [facility] next [facility]. Will need to schedule prior to d/c.   Infant currently asleep and fed formula recently. Follow up tomorrow.    Maternal Data Formula Feeding for Exclusion: No Has patient been taught Hand Expression?: Yes  Feeding Feeding Type: Formula  LATCH Score/Interventions                      Lactation Tools Discussed/Used Pump Review: Setup, frequency, and cleaning   Consult Status Consult Status: Follow-up Date: 04/24/15 Follow-up type: In-patient    Silas FloodSharon S Marguerette Sheller 04/23/2015, 10:59 PM

## 2015-04-24 MED ORDER — IBUPROFEN 600 MG PO TABS: 600.0000 mg | ORAL_TABLET | Freq: Four times a day (QID) | ORAL | Status: DC

## 2015-04-24 MED ORDER — FERROUS SULFATE 325 (65 FE) MG PO TBEC: 325.0000 mg | DELAYED_RELEASE_TABLET | Freq: Two times a day (BID) | ORAL | Status: DC

## 2015-04-24 NOTE — Lactation Note (Signed)
This note was copied from a baby's chart. Lactation Consultation Note  Patient Name: [name] [name] EXBMW'UToday's Date: 04/24/2015 Reason for consult: Follow-up assessment;Breast/nipple pain;Difficult latch   Follow up with mom prior to d/c. Infant is now bottle feeding formula. Mom is pumping some and getting 1-3 cc EBM. She is not pumping every 2-3 hours. She reports her nipples are still scabbed, she is using EBM to nipples. She is not using comfort gels and has shells available to use, she has not put them on this morning. She is aware she can apply Olive oil or Coconut oil to nipples at home. Engorgement prevention/treatment and mastitis symptoms discussed with mom. Infant has f/u ped appt Tomorrow. Mom asked about using Alimentum vs other formula. Told her it is ok to to change if agreeable with pediatrician.   OP Appt scheduled for 3/14 @ 1 pm. Mom plans to try and latch infant to breast once a day and work up is she is able to tolerate it and then follow up with LC. Mom is aware she can call LC with questions/concerns.    Maternal Data    Feeding    LATCH Score/Interventions                      Lactation Tools Discussed/Used WIC Program: No Pump Review: Setup, frequency, and cleaning;Milk Storage   Consult Status Consult Status: Follow-up Date: 05/03/15 Follow-up type: Out-patient    [name] FloodSharon S Elyna [name] 04/24/2015, 10:59 AM

## 2015-04-24 NOTE — Discharge Summary (Signed)
OB Discharge Summary  Patient Name: [name] [name] DOB: 1980/10/25 MRN: [id number]  Date of admission: 04/21/2015 Delivering MD: Adelina MingsSTANDARD, Nakiesha Rumsey   Date of discharge: 04/24/2015  Admitting diagnosis: 40 WEEKS CTX Intrauterine pregnancy: 3450w6d     Secondary diagnosis:Active Problems:   Normal labor   Normal vaginal delivery  Additional problems: Pregnancy 10/18/2014   . Left ankle sprain 07/01/2014  . GAD (generalized anxiety disorder) 06/22/2014  . PTSD (post-traumatic stress disorder) 06/22/2014  . Acute upper respiratory infection 04/26/2014  . Binge eating disorder 04/21/2014  . GERD (gastroesophageal reflux disease) 04/21/2014  . Acne vulgaris 01/11/2014  . Stress due to illness of family member 11/29/2013  . Obesity (BMI 30.0-34.9) 01/12/2013  . Malpositioned IUD (HCC) 04/01/2012  . Anemia 01/31/2012  . History of abnormal Pap smear 08/13/2011  . History of depression 08/13/2011  . Migraines            Discharge diagnosis: Anemia, postterm delivery                                                               Post partum procedures:none  Augmentation: Pitocin  Complications: None  Hospital course:  Onset of Labor With Vaginal Delivery     35 y.o. yo [id number] at 850w6d was admitted in Latent Labor on 04/21/2015. Patient had an uncomplicated labor course as follows:  Membrane Rupture Time/Date: 3:00 AM ,04/21/2015   Intrapartum Procedures: Episiotomy: None [1]                                         Lacerations:  2nd degree [3]  Patient had a delivery of a Viable infant. 04/22/2015  Information for the patient's newborn:  [name] [name], Girl [name] [name] [[id number]][[id number]]  Delivery Method: VBAC, Spontaneous (Filed from Delivery Summary)    Pateint had an uncomplicated postpartum course.  She is ambulating, tolerating a regular diet, passing flatus, and urinating well. Patient is discharged home in stable condition on  04/24/2015.    Physical exam  Filed Vitals:   04/22/15 1828 04/23/15 0636 04/23/15 1735 04/24/15 0640  BP: 122/83 103/68 133/78 90/59  Pulse: 89 79 108 74  Temp: 98.5 F (36.9 C)  98.9 F (37.2 C) 98 F (36.7 C)  TempSrc: Oral  Oral Oral  Resp: 16 18 18 18   Height:      Weight:      SpO2:       General: alert, cooperative and no distress Lochia: appropriate Uterine Fundus: firm Incision: Healing well with no significant drainage DVT Evaluation: No evidence of DVT seen on physical exam. Labs: Lab Results  Component Value Date   WBC 23.3* 04/22/2015   HGB 9.0* 04/22/2015   HCT 27.4* 04/22/2015   MCV 81.1 04/22/2015   PLT 190 04/22/2015   CMP Latest Ref Rng 09/16/2014  Glucose 65 - 99 mg/dL 77  BUN 6 - 20 mg/dL 9  Creatinine 2.440.44 - 0.101.00 mg/dL 2.720.57  Sodium 536135 - 644145 mmol/L 134(L)  Potassium 3.5 - 5.1 mmol/L 4.1  Chloride 101 - 111 mmol/L 98(L)  CO2 22 - 32 mmol/L 23  Calcium 8.9 - 10.3 mg/dL 9.2  Total Protein 6.5 -  8.1 g/dL 7.1  Total Bilirubin 0.3 - 1.2 mg/dL 9.1(Y)  Alkaline Phos 38 - 126 U/L 79  AST 15 - 41 U/L 26  ALT 14 - 54 U/L 35    Discharge instruction: per After Visit Summary and "Baby and Me Booklet".  Medications:  Current facility-administered medications:  .  acetaminophen (TYLENOL) tablet 650 mg, 650 mg, Oral, Q4H PRN, Rhylie Stehr, CNM, 650 mg at 04/23/15 2212 .  benzocaine-Menthol (DERMOPLAST) 20-0.5 % topical spray 1 application, 1 application, Topical, PRN, Dontrae Morini, CNM, 1 application at 04/22/15 1002 .  witch hazel-glycerin (TUCKS) pad 1 application, 1 application, Topical, PRN **AND** dibucaine (NUPERCAINAL) 1 % rectal ointment 1 application, 1 application, Rectal, PRN, Deronda Christian, CNM .  diphenhydrAMINE (BENADRYL) capsule 25 mg, 25 mg, Oral, Q6H PRN, Fallon Howerter, CNM .  ibuprofen (ADVIL,MOTRIN) tablet 600 mg, 600 mg, Oral, 4 times per day, Devone Bonilla, CNM, 600 mg at 04/24/15 0333 .  lanolin ointment, , Topical, PRN, Stepfanie Yott, CNM .  ondansetron (ZOFRAN) tablet 4 mg, 4 mg, Oral, Q4H PRN **OR** ondansetron (ZOFRAN) injection 4 mg, 4 mg, Intravenous, Q4H PRN, Baldwin Racicot, CNM .  prenatal multivitamin tablet 1 tablet, 1 tablet, Oral, Q1200, Cruze Zingaro, CNM, 1 tablet at 04/23/15 1439 .  senna-docusate (Senokot-S) tablet 2 tablet, 2 tablet, Oral, Q24H, Jaree Dwight, CNM, 2 tablet at 04/23/15 2123 .  simethicone (MYLICON) chewable tablet 80 mg, 80 mg, Oral, PRN, Macklen Wilhoite, CNM .  Tdap (BOOSTRIX) injection 0.5 mL, 0.5 mL, Intramuscular, Once, Lunell Robart, CNM, 0.5 mL at 04/23/15 1000 .  zolpidem (AMBIEN) tablet 5 mg, 5 mg, Oral, QHS PRN, Nicloe Frontera, CNM After Visit Meds:    Medication List    ASK your doctor about these medications        acetaminophen 500 MG tablet  Commonly known as:  TYLENOL  Take 1,000 mg by mouth every 6 (six) hours as needed for mild pain or headache.     albuterol 108 (90 Base) MCG/ACT inhaler  Commonly known as:  PROVENTIL HFA;VENTOLIN HFA  Inhale 2 puffs into the lungs every 6 (six) hours as needed for wheezing or shortness of breath.     DICLEGIS 10-10 MG Tbec  Generic drug:  Doxylamine-Pyridoxine  Take 2 tablets by mouth at bedtime.     esomeprazole 20 MG capsule  Commonly known as:  NEXIUM  Take 20 mg by mouth 2 (two) times daily as needed.     ondansetron 8 MG tablet  Commonly known as:  ZOFRAN  Take 8 mg by mouth every 8 (eight) hours as needed for nausea or vomiting.     prenatal multivitamin Tabs tablet  Take 1 tablet by mouth daily at 12 noon.     promethazine 12.5 MG tablet  Commonly known as:  PHENERGAN  Take 12.5 mg by mouth at bedtime as needed for nausea or vomiting.        Diet: routine diet  Activity: Advance as tolerated. Pelvic rest for 6 weeks.   Outpatient follow up:6 weeks Follow up Appt:No future appointments. Follow up visit: No Follow-up on file.  Postpartum contraception: Undecided  Newborn Data: Live born female   Birth Weight: 7 lb 7.8 oz (3395 g) APGAR: 6, 9  Baby Feeding: Breast Disposition:home with mother   04/24/2015 Ekam Bonebrake, CNM      Postpartum Care After Vaginal Delivery  After you deliver your newborn (postpartum period), the usual stay in the hospital is 24 72 hours. If there  were problems with your labor or delivery, or if you have other medical problems, you might be in the hospital longer.  While you are in the hospital, you will receive help and instructions on how to care for yourself and your newborn during the postpartum period.  While you are in the hospital:  Be sure to tell your nurses if you have pain or discomfort, as well as where you feel the pain and what makes the pain worse.  If you had an incision made near your vagina (episiotomy) or if you had some tearing during delivery, the nurses may put ice packs on your episiotomy or tear. The ice packs may help to reduce the pain and swelling.  If you are breastfeeding, you may feel uncomfortable contractions of your uterus for a couple of weeks. This is normal. The contractions help your uterus get back to normal size.  It is normal to have some bleeding after delivery.  For the first 1 3 days after delivery, the flow is red and the amount may be similar to a period.  It is common for the flow to start and stop.  In the first few days, you may pass some small clots. Let your nurses know if you begin to pass large clots or your flow increases.  Do not  flush blood clots down the toilet before having the nurse look at them.  During the next 3 10 days after delivery, your flow should become more watery and pink or brown-tinged in color.  Ten to fourteen days after delivery, your flow should be a small amount of yellowish-white discharge.  The amount of your flow will decrease over the first few weeks after delivery. Your flow may stop in 6 8 weeks. Most women have had their flow stop by 12 weeks after  delivery.  You should change your sanitary pads frequently.  Wash your hands thoroughly with soap and water for at least 20 seconds after changing pads, using the toilet, or before holding or feeding your newborn.  You should feel like you need to empty your bladder within the first 6 8 hours after delivery.  In case you become weak, lightheaded, or faint, call your nurse before you get out of bed for the first time and before you take a shower for the first time.  Within the first few days after delivery, your breasts may begin to feel tender and full. This is called engorgement. Breast tenderness usually goes away within 48 72 hours after engorgement occurs. You may also notice milk leaking from your breasts. If you are not breastfeeding, do not stimulate your breasts. Breast stimulation can make your breasts produce more milk.  Spending as much time as possible with your newborn is very important. During this time, you and your newborn can feel close and get to know each other. Having your newborn stay in your room (rooming in) will help to strengthen the bond with your newborn. It will give you time to get to know your newborn and become comfortable caring for your newborn.  Your hormones change after delivery. Sometimes the hormone changes can temporarily cause you to feel sad or tearful. These feelings should not last more than a few days. If these feelings last longer than that, you should talk to your caregiver.  If desired, talk to your caregiver about methods of family planning or contraception.  Talk to your caregiver about immunizations. Your caregiver may want you to have the following immunizations before  leaving the hospital:  Tetanus, diphtheria, and pertussis (Tdap) or tetanus and diphtheria (Td) immunization. It is very important that you and your family (including grandparents) or others caring for your newborn are up-to-date with the Tdap or Td immunizations. The Tdap or Td  immunization can help protect your newborn from getting ill.  Rubella immunization.  Varicella (chickenpox) immunization.  Influenza immunization. You should receive this annual immunization if you did not receive the immunization during your pregnancy. Document Released: 12/03/2006 Document Revised: 10/31/2011 Document Reviewed: 10/03/2011 [facility] Patient Information 2014 [place], [place].   Postpartum Depression and Baby Blues  The postpartum period begins right after the birth of a baby. During this time, there is often a great amount of joy and excitement. It is also a time of considerable changes in the life of the parent(s). Regardless of how many times a mother gives birth, each child brings new challenges and dynamics to the family. It is not unusual to have feelings of excitement accompanied by confusing shifts in moods, emotions, and thoughts. All mothers are at risk of developing postpartum depression or the "baby blues." These mood changes can occur right after giving birth, or they may occur many months after giving birth. The baby blues or postpartum depression can be mild or severe. Additionally, postpartum depression can resolve rather quickly, or it can be a long-term condition. CAUSES Elevated hormones and their rapid decline are thought to be a main cause of postpartum depression and the baby blues. There are a number of hormones that radically change during and after pregnancy. Estrogen and progesterone usually decrease immediately after delivering your baby. The level of thyroid hormone and various cortisol steroids also rapidly drop. Other factors that play a major role in these changes include major life events and genetics.  RISK FACTORS If you have any of the following risks for the baby blues or postpartum depression, know what symptoms to watch out for during the postpartum period. Risk factors that may increase the likelihood of getting the baby blues or postpartum  depression include: 1. Havinga personal or family history of depression. 2. Having depression while being pregnant. 3. Having premenstrual or oral contraceptive-associated mood issues. 4. Having exceptional life stress. 5. Having marital conflict. 6. Lacking a social support network. 7. Having a baby with special needs. 8. Having health problems such as diabetes. SYMPTOMS Baby blues symptoms include:  Brief fluctuations in mood, such as going from extreme happiness to sadness.  Decreased concentration.  Difficulty sleeping.  Crying spells, tearfulness.  Irritability.  Anxiety. Postpartum depression symptoms typically begin within the first month after giving birth. These symptoms include:  Difficulty sleeping or excessive sleepiness.  Marked weight loss.  Agitation.  Feelings of worthlessness.  Lack of interest in activity or food. Postpartum psychosis is a very concerning condition and can be dangerous. Fortunately, it is rare. Displaying any of the following symptoms is cause for immediate medical attention. Postpartum psychosis symptoms include:  Hallucinations and delusions.  Bizarre or disorganized behavior.  Confusion or disorientation. DIAGNOSIS  A diagnosis is made by an evaluation of your symptoms. There are no medical or lab tests that lead to a diagnosis, but there are various questionnaires that a caregiver may use to identify those with the baby blues, postpartum depression, or psychosis. Often times, a screening tool called the [place] Postnatal Depression Scale is used to diagnose depression in the postpartum period.  TREATMENT The baby blues usually goes away on its own in 1 to 2 weeks. Social  support is often all that is needed. You should be encouraged to get adequate sleep and rest. Occasionally, you may be given medicines to help you sleep.  Postpartum depression requires treatment as it can last several months or longer if it is not treated.  Treatment may include individual or group therapy, medicine, or both to address any social, physiological, and psychological factors that may play a role in the depression. Regular exercise, a healthy diet, rest, and social support may also be strongly recommended.  Postpartum psychosis is more serious and needs treatment right away. Hospitalization is often needed. HOME CARE INSTRUCTIONS  Get as much rest as you can. Nap when the baby sleeps.  Exercise regularly. Some women find yoga and walking to be beneficial.  Eat a balanced and nourishing diet.  Do little things that you enjoy. Have a cup of tea, take a bubble bath, read your favorite magazine, or listen to your favorite music.  Avoid alcohol.  Ask for help with household chores, cooking, grocery shopping, or running errands as needed. Do not try to do everything.  Talk to people close to you about how you are feeling. Get support from your partner, family members, friends, or other new moms.  Try to stay positive in how you think. Think about the things you are grateful for.  Do not spend a lot of time alone.  Only take medicine as directed by your caregiver.  Keep all your postpartum appointments.  Let your caregiver know if you have any concerns. SEEK MEDICAL CARE IF: You are having a reaction or problems with your medicine. SEEK IMMEDIATE MEDICAL CARE IF:  You have suicidal feelings.  You feel you may harm the baby or someone else. Document Released: 11/10/2003 Document Revised: 04/30/2011 Document Reviewed: 12/12/2010 [facility] Patient Information 2014 [place], [place].     Breastfeeding Deciding to breastfeed is one of the best choices you can make for you and your baby. A change in hormones during pregnancy causes your breast tissue to grow and increases the number and size of your milk ducts. These hormones also allow proteins, sugars, and fats from your blood supply to make breast milk in your milk-producing  glands. Hormones prevent breast milk from being released before your baby is born as well as prompt milk flow after birth. Once breastfeeding has begun, thoughts of your baby, as well as his or her sucking or crying, can stimulate the release of milk from your milk-producing glands.  BENEFITS OF BREASTFEEDING For Your Baby  Your first milk (colostrum) helps your baby's digestive system function better.   There are antibodies in your milk that help your baby fight off infections.   Your baby has a lower incidence of asthma, allergies, and sudden infant death syndrome.   The nutrients in breast milk are better for your baby than infant formulas and are designed uniquely for your baby's needs.   Breast milk improves your baby's brain development.   Your baby is less likely to develop other conditions, such as childhood obesity, asthma, or type 2 diabetes mellitus.  For You   Breastfeeding helps to create a very special bond between you and your baby.   Breastfeeding is convenient. Breast milk is always available at the correct temperature and costs nothing.   Breastfeeding helps to burn calories and helps you lose the weight gained during pregnancy.   Breastfeeding makes your uterus contract to its prepregnancy size faster and slows bleeding (lochia) after you give birth.   Breastfeeding  helps to lower your risk of developing type 2 diabetes mellitus, osteoporosis, and breast or ovarian cancer later in life. SIGNS THAT YOUR BABY IS HUNGRY Early Signs of Hunger  Increased alertness or activity.  Stretching.  Movement of the head from side to side.  Movement of the head and opening of the mouth when the corner of the mouth or cheek is stroked (rooting).  Increased sucking sounds, smacking lips, cooing, sighing, or squeaking.  Hand-to-mouth movements.  Increased sucking of fingers or hands. Late Signs of Hunger  Fussing.  Intermittent crying. Extreme Signs of  Hunger Signs of extreme hunger will require calming and consoling before your baby will be able to breastfeed successfully. Do not wait for the following signs of extreme hunger to occur before you initiate breastfeeding:   Restlessness.  A loud, strong cry.   Screaming.   BREASTFEEDING BASICS Breastfeeding Initiation  Find a comfortable place to sit or lie down, with your neck and back well supported.  Place a pillow or rolled up blanket under your baby to bring him or her to the level of your breast (if you are seated). Nursing pillows are specially designed to help support your arms and your baby while you breastfeed.  Make sure that your baby's abdomen is facing your abdomen.   Gently massage your breast. With your fingertips, massage from your chest wall toward your nipple in a circular motion. This encourages milk flow. You may need to continue this action during the feeding if your milk flows slowly.  Support your breast with 4 fingers underneath and your thumb above your nipple. Make sure your fingers are well away from your nipple and your baby's mouth.   Stroke your baby's lips gently with your finger or nipple.   When your baby's mouth is open wide enough, quickly bring your baby to your breast, placing your entire nipple and as much of the colored area around your nipple (areola) as possible into your baby's mouth.   More areola should be visible above your baby's upper lip than below the lower lip.   Your baby's tongue should be between his or her lower gum and your breast.   Ensure that your baby's mouth is correctly positioned around your nipple (latched). Your baby's lips should create a seal on your breast and be turned out (everted).  It is common for your baby to suck about 2-3 minutes in order to start the flow of breast milk. Latching Teaching your baby how to latch on to your breast properly is very important. An improper latch can cause nipple pain and  decreased milk supply for you and poor weight gain in your baby. Also, if your baby is not latched onto your nipple properly, he or she may swallow some air during feeding. This can make your baby fussy. Burping your baby when you switch breasts during the feeding can help to get rid of the air. However, teaching your baby to latch on properly is still the best way to prevent fussiness from swallowing air while breastfeeding. Signs that your baby has successfully latched on to your nipple:    Silent tugging or silent sucking, without causing you pain.   Swallowing heard between every 3-4 sucks.    Muscle movement above and in front of his or her ears while sucking.  Signs that your baby has not successfully latched on to nipple:   Sucking sounds or smacking sounds from your baby while breastfeeding.  Nipple pain. If you  think your baby has not latched on correctly, slip your finger into the corner of your baby's mouth to break the suction and place it between your baby's gums. Attempt breastfeeding initiation again. Signs of Successful Breastfeeding Signs from your baby:   A gradual decrease in the number of sucks or complete cessation of sucking.   Falling asleep.   Relaxation of his or her body.   Retention of a small amount of milk in his or her mouth.   Letting go of your breast by himself or herself. Signs from you:  Breasts that have increased in firmness, weight, and size 1-3 hours after feeding.   Breasts that are softer immediately after breastfeeding.  Increased milk volume, as well as a change in milk consistency and color by the fifth day of breastfeeding.   Nipples that are not sore, cracked, or bleeding. Signs That Your Pecola Leisure is Getting Enough Milk  Wetting at least 3 diapers in a 24-hour period. The urine should be clear and pale yellow by age 33 days.  At least 3 stools in a 24-hour period by age 33 days. The stool should be soft and yellow.  At least  3 stools in a 24-hour period by age 645 days. The stool should be seedy and yellow.  No loss of weight greater than 10% of birth weight during the first 12 days of age.  Average weight gain of 4-7 ounces (113-198 g) per week after age 642 days.  Consistent daily weight gain by age 33 days, without weight loss after the age of 2 weeks. After a feeding, your baby may spit up a small amount. This is common. BREASTFEEDING FREQUENCY AND DURATION Frequent feeding will help you make more milk and can prevent sore nipples and breast engorgement. Breastfeed when you feel the need to reduce the fullness of your breasts or when your baby shows signs of hunger. This is called "breastfeeding on demand." Avoid introducing a pacifier to your baby while you are working to establish breastfeeding (the first 4-6 weeks after your baby is born). After this time you may choose to use a pacifier. Research has shown that pacifier use during the first year of a baby's life decreases the risk of sudden infant death syndrome (SIDS). Allow your baby to feed on each breast as long as he or she wants. Breastfeed until your baby is finished feeding. When your baby unlatches or falls asleep while feeding from the first breast, offer the second breast. Because newborns are often sleepy in the first few weeks of life, you may need to awaken your baby to get him or her to feed. Breastfeeding times will vary from baby to baby. However, the following rules can serve as a guide to help you ensure that your baby is properly fed:  Newborns (babies 41 weeks of age or younger) may breastfeed every 1-3 hours.  Newborns should not go longer than 3 hours during the day or 5 hours during the night without breastfeeding.  You should breastfeed your baby a minimum of 8 times in a 24-hour period until you begin to introduce solid foods to your baby at around 39 months of age. BREAST MILK PUMPING Pumping and storing breast milk allows you to ensure that  your baby is exclusively fed your breast milk, even at times when you are unable to breastfeed. This is especially important if you are going back to work while you are still breastfeeding or when you are not able to be  present during feedings. Your lactation consultant can give you guidelines on how long it is safe to store breast milk.  A breast pump is a machine that allows you to pump milk from your breast into a sterile bottle. The pumped breast milk can then be stored in a refrigerator or freezer. Some breast pumps are operated by hand, while others use electricity. Ask your lactation consultant which type will work best for you. Breast pumps can be purchased, but some hospitals and breastfeeding support groups lease breast pumps on a monthly basis. A lactation consultant can teach you how to hand express breast milk, if you prefer not to use a pump.  CARING FOR YOUR BREASTS WHILE YOU BREASTFEED Nipples can become dry, cracked, and sore while breastfeeding. The following recommendations can help keep your breasts moisturized and healthy:  Avoid using soap on your nipples.   Wear a supportive bra. Although not required, special nursing bras and tank tops are designed to allow access to your breasts for breastfeeding without taking off your entire bra or top. Avoid wearing underwire-style bras or extremely tight bras.  Air dry your nipples for 3-75minutes after each feeding.   Use only cotton bra pads to absorb leaked breast milk. Leaking of breast milk between feedings is normal.   Use lanolin on your nipples after breastfeeding. Lanolin helps to maintain your skin's normal moisture barrier. If you use pure lanolin, you do not need to wash it off before feeding your baby again. Pure lanolin is not toxic to your baby. You may also hand express a few drops of breast milk and gently massage that milk into your nipples and allow the milk to air dry. In the first few weeks after giving birth, some  women experience extremely full breasts (engorgement). Engorgement can make your breasts feel heavy, warm, and tender to the touch. Engorgement peaks within 3-5 days after you give birth. The following recommendations can help ease engorgement:  Completely empty your breasts while breastfeeding or pumping. You may want to start by applying warm, moist heat (in the shower or with warm water-soaked hand towels) just before feeding or pumping. This increases circulation and helps the milk flow. If your baby does not completely empty your breasts while breastfeeding, pump any extra milk after he or she is finished.  Wear a snug bra (nursing or regular) or tank top for 1-2 days to signal your body to slightly decrease milk production.  Apply ice packs to your breasts, unless this is too uncomfortable for you.  Make sure that your baby is latched on and positioned properly while breastfeeding. If engorgement persists after 48 hours of following these recommendations, contact your health care provider or a Advertising copywriter. OVERALL HEALTH CARE RECOMMENDATIONS WHILE BREASTFEEDING  Eat healthy foods. Alternate between meals and snacks, eating 3 of each per day. Because what you eat affects your breast milk, some of the foods may make your baby more irritable than usual. Avoid eating these foods if you are sure that they are negatively affecting your baby.  Drink milk, fruit juice, and water to satisfy your thirst (about 10 glasses a day).   Rest often, relax, and continue to take your prenatal vitamins to prevent fatigue, stress, and anemia.  Continue breast self-awareness checks.  Avoid chewing and smoking tobacco.  Avoid alcohol and drug use. Some medicines that may be harmful to your baby can pass through breast milk. It is important to ask your health care provider before taking any  medicine, including all over-the-counter and prescription medicine as well as vitamin and herbal  supplements. It is possible to become pregnant while breastfeeding. If birth control is desired, ask your health care provider about options that will be safe for your baby. SEEK MEDICAL CARE IF:   You feel like you want to stop breastfeeding or have become frustrated with breastfeeding.  You have painful breasts or nipples.  Your nipples are cracked or bleeding.  Your breasts are red, tender, or warm.  You have a swollen area on either breast.  You have a fever or chills.  You have nausea or vomiting.  You have drainage other than breast milk from your nipples.  Your breasts do not become full before feedings by the fifth day after you give birth.  You feel sad and depressed.  Your baby is too sleepy to eat well.  Your baby is having trouble sleeping.   Your baby is wetting less than 3 diapers in a 24-hour period.  Your baby has less than 3 stools in a 24-hour period.  Your baby's skin or the white part of his or her eyes becomes yellow.   Your baby is not gaining weight by 58 days of age. SEEK IMMEDIATE MEDICAL CARE IF:   Your baby is overly tired (lethargic) and does not want to wake up and feed.  Your baby develops an unexplained fever. Document Released: 02/05/2005 Document Revised: 02/10/2013 Document Reviewed: 07/30/2012 [facility] Patient Information 2015 [place], [place]. This information is not intended to replace advice given to you by your health care provider. Make sure you discuss any questions you have with your health care provider.

## 2015-05-24 DIAGNOSIS — N898 Other specified noninflammatory disorders of vagina: Secondary | ICD-10-CM | POA: Diagnosis not present

## 2015-05-24 DIAGNOSIS — Z308 Encounter for other contraceptive management: Secondary | ICD-10-CM | POA: Diagnosis not present

## 2015-06-09 DIAGNOSIS — Z30430 Encounter for insertion of intrauterine contraceptive device: Secondary | ICD-10-CM | POA: Diagnosis not present

## 2015-06-23 DIAGNOSIS — Z30431 Encounter for routine checking of intrauterine contraceptive device: Secondary | ICD-10-CM | POA: Diagnosis not present

## 2015-07-04 MED FILL — ESTRADIOL 0.025 MG PATCH: 0.025 | 30 days supply | Qty: 8 | Fill #0

## 2015-09-22 DIAGNOSIS — N899 Noninflammatory disorder of vagina, unspecified: Secondary | ICD-10-CM | POA: Diagnosis not present

## 2015-11-08 ENCOUNTER — Ambulatory Visit (INDEPENDENT_AMBULATORY_CARE_PROVIDER_SITE_OTHER): Payer: 59 | Admitting: Physician Assistant

## 2015-11-08 ENCOUNTER — Encounter: Payer: Self-pay | Admitting: Physician Assistant

## 2015-11-08 VITALS — BP 115/79 | HR 69 | Ht 59.0 in | Wt 172.0 lb

## 2015-11-08 DIAGNOSIS — G44229 Chronic tension-type headache, not intractable: Secondary | ICD-10-CM | POA: Diagnosis not present

## 2015-11-08 DIAGNOSIS — N62 Hypertrophy of breast: Secondary | ICD-10-CM

## 2015-11-08 DIAGNOSIS — M25512 Pain in left shoulder: Secondary | ICD-10-CM

## 2015-11-08 DIAGNOSIS — M546 Pain in thoracic spine: Secondary | ICD-10-CM | POA: Diagnosis not present

## 2015-11-08 DIAGNOSIS — M25511 Pain in right shoulder: Secondary | ICD-10-CM | POA: Diagnosis not present

## 2015-11-08 DIAGNOSIS — M549 Dorsalgia, unspecified: Secondary | ICD-10-CM

## 2015-11-08 DIAGNOSIS — G8929 Other chronic pain: Secondary | ICD-10-CM

## 2015-11-08 NOTE — Progress Notes (Signed)
Subjective:     Patient ID: [name] [name], female   DOB: 1981/01/09, 35 y.o.   MRN: [id number]  HPI Patient is a 35 y.o. Caucasian female presenting today with complaints of chronic back pain. The patient states that the pain has worsened since having her last child and that she has difficulty performing everyday activities. Patient notes that she is also having neck pain that radiates to her shoulders and occasional headaches. Patient describes her pain as a constant dull aching and pulling sensation from her large breasts. Patient notes that her current bra size is a 36DDD and that she usually has to wear two bras. The patient states that she has been taking ibuprofen over-the-counter with minimal symptom relief. Patient notes that she is does not currently breastfeed and was unable to do so. Patient denies chest pain, palpitations, shortness of breath, or fever.  Review of Systems  Constitutional: Positive for activity change. Negative for appetite change, chills, diaphoresis, fatigue, fever and unexpected weight change.  HENT: Negative.   Respiratory: Negative for cough, chest tightness, shortness of breath and wheezing.   Cardiovascular: Negative for chest pain, palpitations and leg swelling.  Gastrointestinal: Negative.   Genitourinary: Negative.   Musculoskeletal: Positive for arthralgias, back pain, myalgias and neck pain. Negative for gait problem, joint swelling and neck stiffness.  Skin: Negative.   Neurological: Negative.   Psychiatric/Behavioral: Negative for decreased concentration, dysphoric mood and suicidal ideas. The patient is not nervous/anxious.      Objective:   Physical Exam  Constitutional: She is oriented to person, place, and time. She appears well-developed and well-nourished.  HENT:  Head: Normocephalic and atraumatic.  Right Ear: External ear normal.  Left Ear: External ear normal.  Nose: Nose normal.  Mouth/Throat: Oropharynx is clear and moist. No  oropharyngeal exudate.  Eyes: Conjunctivae and EOM are normal. Pupils are equal, round, and reactive to light. Right eye exhibits no discharge. Left eye exhibits no discharge. No scleral icterus.  Neck: Normal range of motion. No JVD present. No tracheal deviation present. No thyromegaly present.  Cardiovascular: Normal rate, regular rhythm, normal heart sounds and intact distal pulses.  Exam reveals no gallop and no friction rub.   No murmur heard. Pulmonary/Chest: Effort normal and breath sounds normal. No stridor. No respiratory distress. She has no wheezes. She has no rales. She exhibits no tenderness.  Abdominal: Soft. Bowel sounds are normal. She exhibits no distension and no mass. There is no tenderness. There is no rebound and no guarding.  Musculoskeletal: She exhibits tenderness.  Patient with thoracic spine tenderness to palpation.   Lymphadenopathy:    She has no cervical adenopathy.  Neurological: She is alert and oriented to person, place, and time. She displays normal reflexes. No cranial nerve deficit. Coordination normal.  Skin: Skin is warm and dry. No rash noted. She is not diaphoretic. No erythema. No pallor.  Psychiatric: She has a normal mood and affect. Her behavior is normal. Judgment and thought content normal.      Assessment:     Diagnoses and all orders for this visit:  Large breasts -     Ambulatory referral to Plastic Surgery  Bilateral shoulder pain -     Ambulatory referral to Plastic Surgery  Chronic upper back pain -     Ambulatory referral to Plastic Surgery  Chronic tension-type headache, not intractable      Plan:     1. Large breasts/bilateral shoulder pain/chronic upper back pain/headaches - I suspect the patients  symptoms are likely secondary to her large breast size. I suggest the patient consult with Dr. Foster Simpsonlaire Dillingham Sanger a plastic surgeon for possible breast reduction. Discussed with patient that her symptoms will likely resolve  post-surgical reduction of her breasts. Patient instructed to do symptomatic treatment with ibuprofen, stretches, and hot/cold compresses at this time.

## 2015-11-22 ENCOUNTER — Ambulatory Visit (INDEPENDENT_AMBULATORY_CARE_PROVIDER_SITE_OTHER): Payer: 59 | Admitting: Physician Assistant

## 2015-11-22 ENCOUNTER — Encounter: Payer: Self-pay | Admitting: Physician Assistant

## 2015-11-22 VITALS — BP 111/80 | HR 75 | Ht 59.0 in | Wt 169.0 lb

## 2015-11-22 DIAGNOSIS — E6609 Other obesity due to excess calories: Secondary | ICD-10-CM

## 2015-11-22 DIAGNOSIS — Z131 Encounter for screening for diabetes mellitus: Secondary | ICD-10-CM | POA: Diagnosis not present

## 2015-11-22 DIAGNOSIS — Z0000 Encounter for general adult medical examination without abnormal findings: Secondary | ICD-10-CM | POA: Diagnosis not present

## 2015-11-22 DIAGNOSIS — Z6834 Body mass index (BMI) 34.0-34.9, adult: Secondary | ICD-10-CM | POA: Diagnosis not present

## 2015-11-22 DIAGNOSIS — Z13220 Encounter for screening for lipoid disorders: Secondary | ICD-10-CM

## 2015-11-22 DIAGNOSIS — F410 Panic disorder [episodic paroxysmal anxiety] without agoraphobia: Secondary | ICD-10-CM

## 2015-11-22 DIAGNOSIS — K219 Gastro-esophageal reflux disease without esophagitis: Secondary | ICD-10-CM

## 2015-11-22 LAB — TSH: TSH: 1.47 mIU/L

## 2015-11-22 MED ORDER — PANTOPRAZOLE SODIUM 40 MG PO TBEC: 40.0000 mg | DELAYED_RELEASE_TABLET | Freq: Every day | ORAL | 4 refills | Status: DC

## 2015-11-22 MED ORDER — PHENTERMINE HCL 37.5 MG PO TABS: 37.5000 mg | ORAL_TABLET | Freq: Every day | ORAL | 0 refills | Status: DC

## 2015-11-22 MED ORDER — LORAZEPAM 0.5 MG PO TABS: 0.5000 mg | ORAL_TABLET | ORAL | 3 refills | Status: DC | PRN

## 2015-11-22 MED FILL — LORazepam 0.5 MG TABS: 0.5 | 30 days supply | Qty: 30 | Fill #0

## 2015-11-22 MED FILL — PHENTERMINE 37.5 MG TABLET: 37.5 | 30 days supply | Qty: 30 | Fill #0

## 2015-11-22 MED FILL — PANTOPRAZOLE SOD DR 40 MG T: 40 | 90 days supply | Qty: 90 | Fill #0
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
## 2015-11-22 NOTE — Patient Instructions (Signed)

## 2015-11-23 LAB — COMPLETE METABOLIC PANEL WITH GFR
ALT: 12 U/L (ref 6–29)
AST: 17 U/L (ref 10–30)
Albumin: 4.1 g/dL (ref 3.6–5.1)
Alkaline Phosphatase: 93 U/L (ref 33–115)
BUN: 14 mg/dL (ref 7–25)
CALCIUM: 9.2 mg/dL (ref 8.6–10.2)
CHLORIDE: 103 mmol/L (ref 98–110)
CO2: 27 mmol/L (ref 20–31)
CREATININE: 0.66 mg/dL (ref 0.50–1.10)
GFR, Est African American: >89 mL/min (ref >=60)
GFR, Est Non African American: >89 mL/min (ref >=60)
Glucose, Bld: 87 mg/dL (ref 65–99)
Potassium: 4 mmol/L (ref 3.5–5.3)
Sodium: 139 mmol/L (ref 135–146)
Total Bilirubin: 0.6 mg/dL (ref 0.2–1.2)
Total Protein: 7 g/dL (ref 6.1–8.1)

## 2015-11-23 LAB — LIPID PANEL
CHOLESTEROL: 166 mg/dL (ref 125–200)
HDL: 43 mg/dL — ABNORMAL LOW (ref >=46)
LDL CALC: 101 mg/dL (ref <130)
Total CHOL/HDL Ratio: 3.9 Ratio (ref <=5.0)
Triglycerides: 110 mg/dL (ref <150)
VLDL: 22 mg/dL (ref <30)

## 2015-11-29 DIAGNOSIS — N62 Hypertrophy of breast: Secondary | ICD-10-CM | POA: Diagnosis not present

## 2015-11-30 ENCOUNTER — Encounter: Payer: Self-pay | Admitting: Physician Assistant

## 2015-11-30 ENCOUNTER — Other Ambulatory Visit: Payer: Self-pay | Admitting: Physician Assistant

## 2015-11-30 DIAGNOSIS — Z803 Family history of malignant neoplasm of breast: Secondary | ICD-10-CM

## 2015-11-30 DIAGNOSIS — Z1239 Encounter for other screening for malignant neoplasm of breast: Secondary | ICD-10-CM

## 2015-12-16 ENCOUNTER — Encounter: Payer: Self-pay | Admitting: Physician Assistant

## 2015-12-21 ENCOUNTER — Ambulatory Visit (INDEPENDENT_AMBULATORY_CARE_PROVIDER_SITE_OTHER): Payer: 59 | Admitting: Physician Assistant

## 2015-12-21 DIAGNOSIS — Z6834 Body mass index (BMI) 34.0-34.9, adult: Secondary | ICD-10-CM

## 2015-12-21 DIAGNOSIS — E6609 Other obesity due to excess calories: Secondary | ICD-10-CM

## 2015-12-21 MED ORDER — PHENTERMINE HCL 37.5 MG PO TABS: 37.5000 mg | ORAL_TABLET | Freq: Every day | ORAL | 0 refills | Status: DC

## 2015-12-21 NOTE — Progress Notes (Signed)
Pt here for weight and BP check. Denies any palpitations or dizziness. Pt has lost weight.

## 2015-12-21 NOTE — Patient Instructions (Signed)
Return in 1 month for Wt/BP check

## 2015-12-23 ENCOUNTER — Encounter: Payer: Self-pay | Admitting: Physician Assistant

## 2015-12-23 MED FILL — PHENTERMINE 37.5 MG TABLET: 37.5 | 30 days supply | Qty: 30 | Fill #0

## 2016-01-20 DIAGNOSIS — N62 Hypertrophy of breast: Secondary | ICD-10-CM

## 2016-01-20 HISTORY — DX: Hypertrophy of breast: N62

## 2016-01-25 ENCOUNTER — Ambulatory Visit: Payer: Self-pay

## 2016-01-26 ENCOUNTER — Encounter (INDEPENDENT_AMBULATORY_CARE_PROVIDER_SITE_OTHER): Payer: 59 | Admitting: Family Medicine

## 2016-01-26 ENCOUNTER — Encounter (HOSPITAL_BASED_OUTPATIENT_CLINIC_OR_DEPARTMENT_OTHER): Payer: Self-pay | Admitting: *Deleted

## 2016-01-26 DIAGNOSIS — R0989 Other specified symptoms and signs involving the circulatory and respiratory systems: Secondary | ICD-10-CM

## 2016-01-26 HISTORY — DX: Other specified symptoms and signs involving the circulatory and respiratory systems: R09.89

## 2016-02-01 ENCOUNTER — Ambulatory Visit (HOSPITAL_BASED_OUTPATIENT_CLINIC_OR_DEPARTMENT_OTHER): Admission: RE | Admit: 2016-02-01 | Payer: 59 | Source: Ambulatory Visit | Admitting: Plastic Surgery

## 2016-02-01 HISTORY — DX: Gastro-esophageal reflux disease without esophagitis: K21.9

## 2016-02-01 HISTORY — DX: Other specified symptoms and signs involving the circulatory and respiratory systems: R09.89

## 2016-02-01 HISTORY — DX: Hypertrophy of breast: N62

## 2016-02-01 SURGERY — MAMMOPLASTY, REDUCTION
Anesthesia: General | Laterality: Bilateral

## 2016-02-12 ENCOUNTER — Telehealth: Payer: 59 | Admitting: Family

## 2016-02-12 DIAGNOSIS — J019 Acute sinusitis, unspecified: Secondary | ICD-10-CM | POA: Diagnosis not present

## 2016-02-12 MED ORDER — AMOXICILLIN-POT CLAVULANATE 875-125 MG PO TABS: 1.0000 | ORAL_TABLET | Freq: Two times a day (BID) | ORAL | 0 refills | Status: DC

## 2016-02-12 NOTE — Progress Notes (Signed)

## 2016-02-22 MED FILL — PANTOPRAZOLE SOD DR 40 MG T: 40 | 90 days supply | Qty: 90 | Fill #1

## 2016-05-04 MED FILL — PANTOPRAZOLE SOD DR 40 MG T: 40 | 90 days supply | Qty: 90 | Fill #2

## 2016-05-14 ENCOUNTER — Encounter: Payer: Self-pay | Admitting: Physician Assistant

## 2016-08-28 ENCOUNTER — Telehealth: Payer: 59 | Admitting: Family

## 2016-08-28 DIAGNOSIS — J208 Acute bronchitis due to other specified organisms: Secondary | ICD-10-CM

## 2016-08-28 MED ORDER — PREDNISONE 10 MG (21) PO TBPK: ORAL_TABLET | ORAL | 0 refills | Status: DC

## 2016-08-28 NOTE — Progress Notes (Signed)

## 2016-10-23 ENCOUNTER — Encounter: Payer: Self-pay | Admitting: Physician Assistant

## 2016-10-23 MED ORDER — TRETINOIN 0.1 % EX CREA: TOPICAL_CREAM | Freq: Every day | CUTANEOUS | 1 refills | Status: DC

## 2016-10-29 MED FILL — TRETINOIN 0.1% CREAM: 0.1 | 30 days supply | Qty: 45 | Fill #0

## 2016-10-30 ENCOUNTER — Telehealth: Payer: Self-pay

## 2016-10-30 NOTE — Telephone Encounter (Signed)
Pre Authorization was sent to Cover My Meds for RETIN-A. (Key: [id number]) [id number]

## 2016-11-07 MED FILL — PANTOPRAZOLE SOD DR 40 MG T: 40 | 90 days supply | Qty: 90 | Fill #3

## 2016-11-08 NOTE — Telephone Encounter (Signed)
Retin a has been approved.faxed approval to [facility]

## 2016-11-28 ENCOUNTER — Other Ambulatory Visit: Payer: Self-pay | Admitting: Physician Assistant

## 2016-11-28 DIAGNOSIS — F410 Panic disorder [episodic paroxysmal anxiety] without agoraphobia: Secondary | ICD-10-CM

## 2016-11-30 ENCOUNTER — Other Ambulatory Visit: Payer: Self-pay

## 2016-12-04 ENCOUNTER — Other Ambulatory Visit: Payer: Self-pay | Admitting: *Deleted

## 2016-12-05 DIAGNOSIS — Z308 Encounter for other contraceptive management: Secondary | ICD-10-CM | POA: Diagnosis not present

## 2016-12-05 DIAGNOSIS — Z6833 Body mass index (BMI) 33.0-33.9, adult: Secondary | ICD-10-CM | POA: Diagnosis not present

## 2016-12-05 DIAGNOSIS — Z01419 Encounter for gynecological examination (general) (routine) without abnormal findings: Secondary | ICD-10-CM | POA: Diagnosis not present

## 2016-12-12 ENCOUNTER — Encounter: Payer: Self-pay | Admitting: Physician Assistant

## 2016-12-14 ENCOUNTER — Ambulatory Visit (INDEPENDENT_AMBULATORY_CARE_PROVIDER_SITE_OTHER): Payer: 59 | Admitting: Physician Assistant

## 2016-12-14 ENCOUNTER — Encounter: Payer: Self-pay | Admitting: Physician Assistant

## 2016-12-14 VITALS — BP 128/83 | HR 77 | Ht 60.0 in | Wt 169.2 lb

## 2016-12-14 DIAGNOSIS — K219 Gastro-esophageal reflux disease without esophagitis: Secondary | ICD-10-CM

## 2016-12-14 DIAGNOSIS — Z6832 Body mass index (BMI) 32.0-32.9, adult: Secondary | ICD-10-CM | POA: Diagnosis not present

## 2016-12-14 DIAGNOSIS — N62 Hypertrophy of breast: Secondary | ICD-10-CM

## 2016-12-14 DIAGNOSIS — Z0000 Encounter for general adult medical examination without abnormal findings: Secondary | ICD-10-CM

## 2016-12-14 DIAGNOSIS — F419 Anxiety disorder, unspecified: Secondary | ICD-10-CM | POA: Diagnosis not present

## 2016-12-14 DIAGNOSIS — Z13220 Encounter for screening for lipoid disorders: Secondary | ICD-10-CM | POA: Diagnosis not present

## 2016-12-14 DIAGNOSIS — G8929 Other chronic pain: Secondary | ICD-10-CM

## 2016-12-14 DIAGNOSIS — M549 Dorsalgia, unspecified: Secondary | ICD-10-CM | POA: Diagnosis not present

## 2016-12-14 DIAGNOSIS — E6609 Other obesity due to excess calories: Secondary | ICD-10-CM

## 2016-12-14 DIAGNOSIS — Z131 Encounter for screening for diabetes mellitus: Secondary | ICD-10-CM | POA: Diagnosis not present

## 2016-12-14 MED ORDER — LORAZEPAM 0.5 MG PO TABS: 0.5000 mg | ORAL_TABLET | Freq: Three times a day (TID) | ORAL | 1 refills | Status: DC | PRN

## 2016-12-14 MED ORDER — CETIRIZINE HCL 10 MG PO TABS: 10.0000 mg | ORAL_TABLET | Freq: Every day | ORAL | 1 refills | Status: DC

## 2016-12-14 MED ORDER — RANITIDINE HCL 150 MG PO TABS: 150.0000 mg | ORAL_TABLET | Freq: Two times a day (BID) | ORAL | 3 refills | Status: DC

## 2016-12-14 MED ORDER — PANTOPRAZOLE SODIUM 40 MG PO TBEC: 40.0000 mg | DELAYED_RELEASE_TABLET | Freq: Every day | ORAL | 3 refills | Status: DC

## 2016-12-14 MED ORDER — LIRAGLUTIDE -WEIGHT MANAGEMENT 18 MG/3ML ~~LOC~~ SOPN: 0.6000 mg | PEN_INJECTOR | Freq: Every day | SUBCUTANEOUS | 1 refills | Status: DC

## 2016-12-14 NOTE — Progress Notes (Deleted)
[facility] Dads sister Maternal grandmother  Cup G [id number] back   [id number]  No thyroid or pancrea

## 2016-12-14 NOTE — Patient Instructions (Signed)
Discussed low carb diet with 1500 calories and 80g of protein.  Exercising at least 150 minutes a week.  My Fitness Pal could be a great resource.    Keeping You Healthy  Get These Tests 1. Blood Pressure- Have your blood pressure checked once a year by your health care provider.  Normal blood pressure is 120/80. 2. Weight- Have your body mass index (BMI) calculated to screen for obesity.  BMI is measure of body fat based on height and weight.  You can also calculate your own BMI at www.nhlbisupport.com/bmi/. 3. Cholesterol- Have your cholesterol checked every 5 years starting at age 20 then yearly starting at age 45. 4. Chlamydia, HIV, and other sexually transmitted diseases- Get screened every year until age 25, then within three months of each new sexual provider. 5. Pap Test - Every 1-5 years; discuss with your health care provider. 6. Mammogram- Every 1-2 years starting at age 40--50  Take these medicines  Calcium with Vitamin D-Your body needs 1200 mg of Calcium each day and 800-1000 IU of Vitamin D daily.  Your body can only absorb 500 mg of Calcium at a time so Calcium must be taken in 2 or 3 divided doses throughout the day.  Multivitamin with folic acid- Once daily if it is possible for you to become pregnant.  Get these Immunizations  Gardasil-Series of three doses; prevents HPV related illness such as genital warts and cervical cancer.  Menactra-Single dose; prevents meningitis.  Tetanus shot- Every 10 years.  Flu shot-Every year.  Take these steps 1. Do not smoke-Your healthcare provider can help you quit.  For tips on how to quit go to www.smokefree.gov or call 1-800 QUITNOW. 2. Be physically active- Exercise 5 days a week for at least 30 minutes.  If you are not already physically active, start slow and gradually work up to 30 minutes of moderate physical activity.  Examples of moderate activity include walking briskly, dancing, swimming, bicycling, etc. 3. Breast  Cancer- A self breast exam every month is important for early detection of breast cancer.  For more information and instruction on self breast exams, ask your healthcare provider or www.womenshealth.gov/faq/breast-self-exam.cfm. 4. Eat a healthy diet- Eat a variety of healthy foods such as fruits, vegetables, whole grains, low fat milk, low fat cheeses, yogurt, lean meats, poultry and fish, beans, nuts, tofu, etc.  For more information go to www. Thenutritionsource.org 5. Drink alcohol in moderation- Limit alcohol intake to one drink or less per day. Never drink and drive. 6. Depression- Your emotional health is as important as your physical health.  If you're feeling down or losing interest in things you normally enjoy please talk to your healthcare provider about being screened for depression. 7. Dental visit- Brush and floss your teeth twice daily; visit your dentist twice a year. 8. Eye doctor- Get an eye exam at least every 2 years. 9. Helmet use- Always wear a helmet when riding a bicycle, motorcycle, rollerblading or skateboarding. 10. Safe sex- If you may be exposed to sexually transmitted infections, use a condom. 11. Seat belts- Seat belts can save your live; always wear one. 12. Smoke/Carbon Monoxide detectors- These detectors need to be installed on the appropriate level of your home. Replace batteries at least once a year. 13. Skin cancer- When out in the sun please cover up and use sunscreen 15 SPF or higher. 14. Violence- If anyone is threatening or hurting you, please tell your healthcare provider.        

## 2016-12-16 ENCOUNTER — Encounter: Payer: Self-pay | Admitting: Physician Assistant

## 2016-12-16 ENCOUNTER — Telehealth: Payer: Self-pay | Admitting: Physician Assistant

## 2016-12-16 DIAGNOSIS — M549 Dorsalgia, unspecified: Secondary | ICD-10-CM

## 2016-12-16 DIAGNOSIS — G8929 Other chronic pain: Secondary | ICD-10-CM | POA: Insufficient documentation
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
## 2016-12-16 NOTE — Telephone Encounter (Signed)
Need pap from [facility]

## 2016-12-17 MED FILL — LORazepam 0.5 MG TABS: 0.5 | 10 days supply | Qty: 30 | Fill #0

## 2016-12-18 NOTE — Telephone Encounter (Signed)
Fax request placed up front. 

## 2016-12-25 ENCOUNTER — Telehealth: Payer: Self-pay | Admitting: *Deleted

## 2016-12-25 NOTE — Telephone Encounter (Signed)
No updated insurance card in the system. Used the already inputted info from pharmacy. Pre Authorization sent to cover my meds.[id number]

## 2017-01-03 NOTE — Telephone Encounter (Signed)
Received fax for more information filled out form and faxed it back - CF

## 2017-01-08 NOTE — Telephone Encounter (Signed)
saxenda approved. Pharmacy notified and pat notified via vm . Approval good for a maximum of 4 fills from 01/07/2017 to 05/06/2017

## 2017-01-30 ENCOUNTER — Ambulatory Visit: Payer: Self-pay | Admitting: Physician Assistant

## 2017-03-01 ENCOUNTER — Encounter: Payer: Self-pay | Admitting: Physician Assistant

## 2017-03-18 MED FILL — PANTOPRAZOLE SOD DR 40 MG T: 40 | 90 days supply | Qty: 90 | Fill #0

## 2017-04-24 ENCOUNTER — Telehealth: Payer: Self-pay | Admitting: Physician Assistant

## 2017-04-24 NOTE — Telephone Encounter (Signed)
Received fax from [employer] that Saxenda requires a PA. Information has been sent to the insurance company. Awaiting determination.   

## 2017-05-03 NOTE — Telephone Encounter (Signed)
Received fax from [employer] that Saxenda was approved from 05/03/2017 until 09/01/2017. Pharmacy notified and forms sent to scan.   Reference ID: [id number].

## 2017-05-08 MED FILL — LORazepam 0.5 MG TABS: 0.5 | 10 days supply | Qty: 30 | Fill #1

## 2017-05-30 ENCOUNTER — Telehealth: Payer: No Typology Code available for payment source | Admitting: Family

## 2017-05-30 DIAGNOSIS — B9689 Other specified bacterial agents as the cause of diseases classified elsewhere: Secondary | ICD-10-CM | POA: Diagnosis not present

## 2017-05-30 DIAGNOSIS — J329 Chronic sinusitis, unspecified: Secondary | ICD-10-CM | POA: Diagnosis not present

## 2017-05-30 MED ORDER — AMOXICILLIN-POT CLAVULANATE 875-125 MG PO TABS: 1.0000 | ORAL_TABLET | Freq: Two times a day (BID) | ORAL | 0 refills | Status: AC

## 2017-05-30 NOTE — Progress Notes (Signed)

## 2017-06-25 MED FILL — PANTOPRAZOLE SOD DR 40 MG T: 40 | 90 days supply | Qty: 90 | Fill #1

## 2017-08-05 ENCOUNTER — Encounter: Payer: 59 | Admitting: Physician Assistant

## 2017-08-07 ENCOUNTER — Encounter: Payer: Self-pay | Admitting: Physician Assistant

## 2017-08-12 ENCOUNTER — Encounter: Payer: 59 | Admitting: Physician Assistant

## 2017-08-13 ENCOUNTER — Ambulatory Visit (INDEPENDENT_AMBULATORY_CARE_PROVIDER_SITE_OTHER): Payer: No Typology Code available for payment source | Admitting: Physician Assistant

## 2017-08-13 ENCOUNTER — Encounter: Payer: Self-pay | Admitting: Physician Assistant

## 2017-08-13 VITALS — BP 109/72 | HR 69 | Ht 60.0 in | Wt 162.0 lb

## 2017-08-13 DIAGNOSIS — F419 Anxiety disorder, unspecified: Secondary | ICD-10-CM

## 2017-08-13 DIAGNOSIS — L700 Acne vulgaris: Secondary | ICD-10-CM

## 2017-08-13 DIAGNOSIS — Z0000 Encounter for general adult medical examination without abnormal findings: Secondary | ICD-10-CM | POA: Diagnosis not present

## 2017-08-13 DIAGNOSIS — Z8349 Family history of other endocrine, nutritional and metabolic diseases: Secondary | ICD-10-CM

## 2017-08-13 DIAGNOSIS — Z131 Encounter for screening for diabetes mellitus: Secondary | ICD-10-CM | POA: Diagnosis not present

## 2017-08-13 DIAGNOSIS — K219 Gastro-esophageal reflux disease without esophagitis: Secondary | ICD-10-CM

## 2017-08-13 DIAGNOSIS — Z13220 Encounter for screening for lipoid disorders: Secondary | ICD-10-CM | POA: Diagnosis not present

## 2017-08-13 MED ORDER — LORAZEPAM 0.5 MG PO TABS: 0.5000 mg | ORAL_TABLET | Freq: Three times a day (TID) | ORAL | 2 refills | Status: DC | PRN

## 2017-08-13 MED ORDER — PANTOPRAZOLE SODIUM 40 MG PO TBEC: 40.0000 mg | DELAYED_RELEASE_TABLET | Freq: Every day | ORAL | 4 refills | Status: DC

## 2017-08-13 MED ORDER — RANITIDINE HCL 150 MG PO TABS: 150.0000 mg | ORAL_TABLET | Freq: Two times a day (BID) | ORAL | 3 refills | Status: DC

## 2017-08-13 MED FILL — raNITIdine HCL 150 MG TABS: 150 | 90 days supply | Qty: 180 | Fill #0
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
## 2017-08-13 NOTE — Patient Instructions (Signed)

## 2017-08-14 LAB — TSH: TSH: 1.32 mIU/L

## 2017-08-14 LAB — LIPID PANEL W/REFLEX DIRECT LDL
CHOLESTEROL: 157 mg/dL (ref <200)
HDL: 44 mg/dL — ABNORMAL LOW (ref >50)
LDL Cholesterol (Calc): 97 mg/dL (calc)
Non-HDL Cholesterol (Calc): 113 mg/dL (calc) (ref <130)
Total CHOL/HDL Ratio: 3.6 (calc) (ref <5.0)
Triglycerides: 69 mg/dL (ref <150)

## 2017-08-14 LAB — COMPLETE METABOLIC PANEL WITH GFR
AG Ratio: 1.6 (calc) (ref 1.0–2.5)
ALBUMIN MSPROF: 4 g/dL (ref 3.6–5.1)
ALT: 11 U/L (ref 6–29)
AST: 18 U/L (ref 10–30)
Alkaline phosphatase (APISO): 73 U/L (ref 33–115)
BUN: 15 mg/dL (ref 7–25)
CO2: 25 mmol/L (ref 20–32)
CREATININE: 0.78 mg/dL (ref 0.50–1.10)
Calcium: 9.1 mg/dL (ref 8.6–10.2)
Chloride: 105 mmol/L (ref 98–110)
GFR, Est African American: 114 mL/min/{1.73_m2} (ref >=60)
GFR, Est Non African American: 98 mL/min/{1.73_m2} (ref >=60)
GLOBULIN: 2.5 g/dL (ref 1.9–3.7)
GLUCOSE: 95 mg/dL (ref 65–99)
Potassium: 3.7 mmol/L (ref 3.5–5.3)
SODIUM: 138 mmol/L (ref 135–146)
Total Bilirubin: 0.7 mg/dL (ref 0.2–1.2)
Total Protein: 6.5 g/dL (ref 6.1–8.1)

## 2017-08-14 LAB — VITAMIN D 25 HYDROXY (VIT D DEFICIENCY, FRACTURES): Vit D, 25-Hydroxy: 39 ng/mL (ref 30–100)

## 2017-08-14 NOTE — Progress Notes (Signed)
Call pt: cholesterol looks great. Kidney, liver, glucose look great. Vitamin D looks good. Thyroid looks good.

## 2017-08-20 MED FILL — LORazepam 0.5 MG TABS: 0.5 | 10 days supply | Qty: 30 | Fill #0

## 2017-08-21 ENCOUNTER — Encounter: Payer: Self-pay | Admitting: Physician Assistant

## 2017-12-11 ENCOUNTER — Telehealth: Payer: No Typology Code available for payment source | Admitting: Nurse Practitioner

## 2017-12-11 DIAGNOSIS — R05 Cough: Secondary | ICD-10-CM | POA: Diagnosis not present

## 2017-12-11 DIAGNOSIS — R059 Cough, unspecified: Secondary | ICD-10-CM

## 2017-12-11 MED ORDER — AZITHROMYCIN 250 MG PO TABS: ORAL_TABLET | ORAL | 0 refills | Status: DC

## 2017-12-11 MED ORDER — BENZONATATE 100 MG PO CAPS: 100.0000 mg | ORAL_CAPSULE | Freq: Three times a day (TID) | ORAL | 0 refills | Status: DC | PRN

## 2017-12-11 NOTE — Progress Notes (Signed)

## 2018-01-06 MED FILL — PANTOPRAZOLE SOD DR 40 MG T: 40 | 90 days supply | Qty: 90 | Fill #0

## 2018-03-05 ENCOUNTER — Telehealth: Payer: No Typology Code available for payment source | Admitting: Nurse Practitioner

## 2018-03-05 DIAGNOSIS — J0100 Acute maxillary sinusitis, unspecified: Secondary | ICD-10-CM

## 2018-03-05 MED ORDER — AMOXICILLIN-POT CLAVULANATE 875-125 MG PO TABS: 1.0000 | ORAL_TABLET | Freq: Two times a day (BID) | ORAL | 0 refills | Status: DC

## 2018-03-05 NOTE — Progress Notes (Signed)

## 2018-03-13 ENCOUNTER — Ambulatory Visit (INDEPENDENT_AMBULATORY_CARE_PROVIDER_SITE_OTHER): Payer: No Typology Code available for payment source | Admitting: Osteopathic Medicine

## 2018-03-13 ENCOUNTER — Encounter: Payer: Self-pay | Admitting: Osteopathic Medicine

## 2018-03-13 VITALS — BP 120/70 | HR 79 | Temp 98.2°F | Wt 169.5 lb

## 2018-03-13 DIAGNOSIS — R05 Cough: Secondary | ICD-10-CM

## 2018-03-13 DIAGNOSIS — R058 Other specified cough: Secondary | ICD-10-CM

## 2018-03-13 MED ORDER — GUAIFENESIN-CODEINE 100-10 MG/5ML PO SYRP: 5.0000 mL | ORAL_SOLUTION | Freq: Three times a day (TID) | ORAL | 0 refills | Status: DC | PRN

## 2018-03-13 MED ORDER — PREDNISONE 20 MG PO TABS: 20.0000 mg | ORAL_TABLET | Freq: Two times a day (BID) | ORAL | 0 refills | Status: DC

## 2018-03-13 MED ORDER — BENZONATATE 200 MG PO CAPS: 200.0000 mg | ORAL_CAPSULE | Freq: Two times a day (BID) | ORAL | 0 refills | Status: DC | PRN

## 2018-03-13 NOTE — Patient Instructions (Signed)
Medications & Home Remedies for Upper Respiratory Illness    Aches/Pains, Fever, Headache OTC Acetaminophen (Tylenol) 500 mg tablets - take max 2 tablets (1000 mg) every 6 hours (4 times per day)  OTC Ibuprofen (Motrin) 200 mg tablets - take max 4 tablets (800 mg) every 6 hours   Sinus Congestion OTC Nasal Saline if desired to rinse OTC Oxymetolazone (Afrin, others) sparing use due to rebound congestion, NEVER use in kids OTC Phenylephrine (Sudafed) 10 mg tablets every 4 hours (or the 12-hour formulation) OTC Diphenhydramine (Benadryl) 25 mg tablets - take max 2 tablets every 4 hours   Cough & Sore Throat OTC Dextromethorphan (Robitussin, others) - cough suppressant OTC Guaifenesin (Robitussin, Mucinex, others) - expectorant (helps cough up mucus) (Dextromethorphan and Guaifenesin also come in a combination tablet/syrup) OTC Lozenges w/ Benzocaine + Menthol (Cepacol) Honey - as much as you want! Teas which "coat the throat" - look for ingredients Elm Bark, Licorice Root, Marshmallow Root   Other Prescription Oral Steroids to decrease inflammation and improve energy Prescription Antibiotics if these are necessary for bacterial infection - take ALL, even if you're feeling better  OTC Zinc Lozenges within 24 hours of symptoms onset - mixed evidence this shortens the duration of the common cold Don't waste your money on Vitamin C or Echinacea in acute illness - it's already too late!

## 2018-03-13 NOTE — Progress Notes (Signed)
HPI: [name] [name] is a 38 y.o. female who  has a past medical history of Anxiety, Breast hypertrophy (01/2016), GERD (gastroesophageal reflux disease), Migraines, and Runny nose (01/26/2016).  she presents to [facility] today, 03/13/18,  for chief complaint of: Follow-up from e-visit, sick  On e-visit, sinus infection treated w/ abx - sinus pressure has resolved. Cough persists, NO fever/chills, no SOB.      Past medical, surgical, social and family history reviewed and updated as necessary.   Current medication list and allergy/intolerance information reviewed:    Current Outpatient Medications  Medication Sig Dispense Refill  . ibuprofen (ADVIL,MOTRIN) 600 MG tablet Take 1 tablet (600 mg total) by mouth every 6 (six) hours. 30 tablet 0  . LORazepam (ATIVAN) 0.5 MG tablet Take 1 tablet (0.5 mg total) by mouth every 8 (eight) hours as needed for anxiety. 30 tablet 2  . Multiple Vitamin (MULTIVITAMIN) tablet Take 1 tablet by mouth daily.    . pantoprazole (PROTONIX) 40 MG tablet Take 1 tablet (40 mg total) by mouth daily. 90 tablet 4  . tretinoin (RETIN-A) 0.1 % cream Apply topically at bedtime. For acne. 45 g 1  . amoxicillin-clavulanate (AUGMENTIN) 875-125 MG tablet Take 1 tablet by mouth 2 (two) times daily. (Patient not taking: Reported on 03/13/2018) 14 tablet 0  . azithromycin (ZITHROMAX Z-PAK) 250 MG tablet As directed (Patient not taking: Reported on 03/13/2018) 6 tablet 0  . benzonatate (TESSALON) 200 MG capsule Take 1 capsule (200 mg total) by mouth 2 (two) times daily as needed for cough. 20 capsule 0  . cetirizine (ZYRTEC) 10 MG tablet Take 1 tablet (10 mg total) by mouth daily. (Patient not taking: Reported on 03/13/2018) 90 tablet 1  . guaiFENesin-codeine (ROBITUSSIN AC) 100-10 MG/5ML syrup Take 5-10 mLs by mouth 3 (three) times daily as needed for cough. 118 mL 0  . predniSONE (DELTASONE) 20 MG tablet Take 1 tablet (20 mg total) by mouth  2 (two) times daily with a meal. 10 tablet 0  . ranitidine (ZANTAC) 150 MG tablet Take 1 tablet (150 mg total) by mouth 2 (two) times daily. (Patient not taking: Reported on 03/13/2018) 180 tablet 3   No current facility-administered medications for this visit.     Allergies  Allergen Reactions  . Wellbutrin [Bupropion] Other (See Comments)    CAUSES ANGER  . Adhesive [Tape]     SKIN IRRITATION      Review of Systems:  Constitutional:  No  fever, no chills, +recent illness, No unintentional weight changes. No significant fatigue.   HEENT: No  headache, no vision change, no hearing change, No sore throat, No  sinus pressure  Cardiac: No  chest pain, No  pressure, No palpitations  Respiratory:  No  shortness of breath. +Cough  Gastrointestinal: No  abdominal pain, No  nausea  Skin: No  Rash  Neurologic: No  weakness, No  dizziness  Exam:  BP 120/70 (BP Location: Left Arm, Patient Position: Sitting, Cuff Size: Normal)   Pulse 79   Temp 98.2 F (36.8 C) (Oral)   Wt 169 lb 8 oz (76.9 kg)   SpO2 97%   BMI 33.10 kg/m   Constitutional: VS see above. General Appearance: alert, well-developed, well-nourished, NAD  Eyes: Normal lids and conjunctive, non-icteric sclera  Ears, Nose, Mouth, Throat: MMM, Normal external inspection ears/nares/mouth/lips/gums. TM normal bilaterally. Pharynx/tonsils no erythema, no exudate. Nasal mucosa normal.   Neck: No masses, trachea midline. No tenderness/mass appreciated. No lymphadenopathy  Respiratory: Normal respiratory effort. no wheeze, no rhonchi, no rales  Cardiovascular: S1/S2 normal, no murmur, no rub/gallop auscultated. RRR. No lower extremity edema.   Gastrointestinal: Nontender, no masses. Bowel sounds normal.  Musculoskeletal: Gait normal.   Neurological: Normal balance/coordination. No tremor.   Skin: warm, dry, intact.   Psychiatric: Normal judgment/insight. Normal mood and affect.   No results found for this or any  previous visit (from the past 72 hour(s)).  No results found.   ASSESSMENT/PLAN: The encounter diagnosis was Post-viral cough syndrome.   Meds ordered this encounter  Medications  . predniSONE (DELTASONE) 20 MG tablet    Sig: Take 1 tablet (20 mg total) by mouth 2 (two) times daily with a meal.    Dispense:  10 tablet    Refill:  0  . benzonatate (TESSALON) 200 MG capsule    Sig: Take 1 capsule (200 mg total) by mouth 2 (two) times daily as needed for cough.    Dispense:  20 capsule    Refill:  0  . guaiFENesin-codeine (ROBITUSSIN AC) 100-10 MG/5ML syrup    Sig: Take 5-10 mLs by mouth 3 (three) times daily as needed for cough.    Dispense:  118 mL    Refill:  0    No orders of the defined types were placed in this encounter.   Patient Instructions  Medications & Home Remedies for Upper Respiratory Illness    Aches/Pains, Fever, Headache OTC Acetaminophen (Tylenol) 500 mg tablets - take max 2 tablets (1000 mg) every 6 hours (4 times per day)  OTC Ibuprofen (Motrin) 200 mg tablets - take max 4 tablets (800 mg) every 6 hours   Sinus Congestion OTC Nasal Saline if desired to rinse OTC Oxymetolazone (Afrin, others) sparing use due to rebound congestion, NEVER use in kids OTC Phenylephrine (Sudafed) 10 mg tablets every 4 hours (or the 12-hour formulation) OTC Diphenhydramine (Benadryl) 25 mg tablets - take max 2 tablets every 4 hours   Cough & Sore Throat OTC Dextromethorphan (Robitussin, others) - cough suppressant OTC Guaifenesin (Robitussin, Mucinex, others) - expectorant (helps cough up mucus) (Dextromethorphan and Guaifenesin also come in a combination tablet/syrup) OTC Lozenges w/ Benzocaine + Menthol (Cepacol) Honey - as much as you want! Teas which "coat the throat" - look for ingredients Elm Bark, Licorice Root, Marshmallow Root   Other Prescription Oral Steroids to decrease inflammation and improve energy Prescription Antibiotics if these are necessary for  bacterial infection - take ALL, even if you're feeling better  OTC Zinc Lozenges within 24 hours of symptoms onset - mixed evidence this shortens the duration of the common cold Don't waste your money on Vitamin C or Echinacea in acute illness - it's already too late!        Visit summary with medication list and pertinent instructions was printed for patient to review. All questions at time of visit were answered - patient instructed to contact office with any additional concerns or updates. ER/RTC precautions were reviewed with the patient.   Follow-up plan: Return if symptoms worsen or fail to improve.    Please note: voice recognition software was used to produce this document, and typos may escape review. Please contact Dr. Lyn Hollingshead for any needed clarifications.

## 2018-03-14 ENCOUNTER — Telehealth: Payer: Self-pay

## 2018-03-14 ENCOUNTER — Ambulatory Visit: Payer: Self-pay | Admitting: Physician Assistant

## 2018-03-14 MED ORDER — BUDESONIDE-FORMOTEROL FUMARATE 80-4.5 MCG/ACT IN AERO: 2.0000 | INHALATION_SPRAY | Freq: Two times a day (BID) | RESPIRATORY_TRACT | 0 refills | Status: DC

## 2018-03-14 NOTE — Telephone Encounter (Signed)
Patient advised of recommendations.  

## 2018-03-14 NOTE — Telephone Encounter (Signed)
I sent Symbicort to pharmacy on file, [facility].  If it is crazy expensive for her, I do not mind if we give her a sample here from our supply.

## 2018-03-14 NOTE — Telephone Encounter (Signed)
[name] [name] called and states she does need an inhaler. She is wheezing and the cough is worse. Please advise.

## 2018-04-08 ENCOUNTER — Encounter: Payer: Self-pay | Admitting: Physician Assistant

## 2018-04-08 ENCOUNTER — Ambulatory Visit (INDEPENDENT_AMBULATORY_CARE_PROVIDER_SITE_OTHER): Payer: No Typology Code available for payment source | Admitting: Physician Assistant

## 2018-04-08 VITALS — BP 117/77 | HR 64 | Ht 60.0 in | Wt 166.0 lb

## 2018-04-08 DIAGNOSIS — M79621 Pain in right upper arm: Secondary | ICD-10-CM

## 2018-04-08 MED ORDER — DICLOFENAC SODIUM 1 % TD GEL: 4.0000 g | Freq: Four times a day (QID) | TRANSDERMAL | 1 refills | Status: DC

## 2018-04-08 MED ORDER — MELOXICAM 15 MG PO TABS: 15.0000 mg | ORAL_TABLET | Freq: Every day | ORAL | 1 refills | Status: DC

## 2018-04-08 NOTE — Progress Notes (Signed)
Subjective:    Patient ID: [name] [name], female    DOB: 1980/06/08, 38 y.o.   MRN: [id number]  HPI Pt is a 38 yo female who presents to the clinic to discuss recheck right upper arm pain for 5 days. She has recently been trying to lead a healthier lifestyle and eating better and working out. She is walking daily, lifting weights, and joined weight watchers. Thursday morning she was lifting weights, 5lbs. She admits her form may not have been great and "kinda rushed through movements". Friday morning her right upper arm started hurting. Ibuprofen helps but then wears off. At rest no pain. Only pain with moving certain ways and lifting.   .. Active Ambulatory Problems    Diagnosis Date Noted  . Migraines 03/24/2011  . Anemia 01/31/2012  . Malpositioned IUD 04/01/2012  . History of abnormal Pap smear 08/13/2011  . History of depression 08/13/2011  . Obesity (BMI 30.0-34.9) 01/12/2013  . Stress due to illness of family member 11/29/2013  . Acne vulgaris 01/11/2014  . Binge eating disorder 04/21/2014  . GERD (gastroesophageal reflux disease) 04/21/2014  . GAD (generalized anxiety disorder) 06/22/2014  . PTSD (post-traumatic stress disorder) 06/22/2014  . Pregnancy 10/18/2014  . Normal labor 04/21/2015  . Normal vaginal delivery 04/22/2015  . Class 1 obesity due to excess calories without serious comorbidity with body mass index (BMI) of 34.0 to 34.9 in adult 11/22/2015  . Anxiety 12/14/2016  . Chronic upper back pain 12/16/2016   Resolved Ambulatory Problems    Diagnosis Date Noted  . Edema in pregnancy 01/14/2012  . Mild hypertension 01/14/2012  . Status post primary low transverse cesarean section 01/30/2012  . Acute appendicitis s/p lap appy 04/01/2012 04/01/2012  . Postsurgical fever 04/07/2012  . Acute upper respiratory infection 04/26/2014  . Left ankle sprain 07/01/2014   Past Medical History:  Diagnosis Date  . Breast hypertrophy 01/2016  . Runny nose 01/26/2016     Review of Systems  All other systems reviewed and are negative.      Objective:   Physical Exam Vitals signs reviewed.  Constitutional:      Appearance: Normal appearance.  HENT:     Head: Normocephalic and atraumatic.  Cardiovascular:     Rate and Rhythm: Normal rate.  Pulmonary:     Effort: Pulmonary effort is normal.  Musculoskeletal:     Comments: NROM at right shoulder.  No tenderness over right shoulder or epicondyles.  No swelling, redness or brusing over the antecubital space.  Bicep muscle intact.  Tenderness to palpation over distal bicep and up into bicep.  Pain with resistance to bicep flexion.  Negative finklestein.  Hand grip 5/5.  Upper ext strength 5/5.    Neurological:     General: No focal deficit present.     Mental Status: She is alert.  Psychiatric:        Mood and Affect: Mood normal.           Assessment & Plan:  Marland KitchenMarland KitchenMaitreyi was seen today for arm pain.  Diagnoses and all orders for this visit:  Pain in right upper arm -     diclofenac sodium (VOLTAREN) 1 % GEL; Apply 4 g topically 4 (four) times daily. To affected joint. -     meloxicam (MOBIC) 15 MG tablet; Take 1 tablet (15 mg total) by mouth daily.  I certainly feel like some inflammation at the distal bicep insertion. Use voltaren gel, stop ibuprofen and start mobic(take with food). Ice  as needed. Consider icy hot. Stop lifting weights for now. When you start back use slow movements with good form. Given some stretches for some distal bicep tendonitis. Follow up if not improving in the next 2 weeks.

## 2018-04-08 NOTE — Patient Instructions (Signed)
Biceps Tendon Tendinitis (Distal) Rehab  Ask your health care provider which exercises are safe for you. Do exercises exactly as told by your health care provider and adjust them as directed. It is normal to feel mild stretching, pulling, tightness, or discomfort as you do these exercises, but you should stop right away if you feel sudden pain or your pain gets worse. Do not begin these exercises until told by your health care provider.  Stretching and range of motion exercises  These exercises warm up your muscles and joints and improve the movement and flexibility of your arm. These exercises can also help to relieve pain and stiffness.  Exercise A: Supination, active-assisted  1. Stand or sit with your left / right elbow bent to an "L" shape (90 degrees).  2. Rotate your palm up until you cannot rotate it anymore. Then, use your other hand to help rotate your left / right forearm more.  3. Hold this position for __________ seconds.  4. Slowly return to the starting position.  Repeat __________ times. Complete this exercise __________ times a day.  Exercise B: Pronation, active-assisted    1. Stand or sit with your left / right elbow bent to an "L" shape (90 degrees).  2. Rotate your left / right palm down until you cannot rotate it anymore. Then, use your other hand to help rotate your left / right forearm more.  3. Hold this position for __________ seconds.  4. Slowly return to the starting position.  Repeat __________ times. Complete this exercise __________ times a day.  Strengthening exercises  These exercises build strength and endurance in your arm and shoulder. Endurance is the ability to use your muscles for a long time, even after your muscles get tired.  Exercise C: Supination    1. Sit with your left / right forearm supported on a table. Your elbow should be at waist height.  2. Rest your hand over the edge of the table, palm-down.  3. Gently grasp a lightweight hammer near the head. As this exercise  gets easier for you, try holding the hammer farther down the handle.  4. Without moving your elbow, slowly rotate your palm up so it faces the ceiling.  5. Hold this position for__________ seconds.  6. Slowly return to the starting position.  Repeat __________ times. Complete this exercise __________ times a day.  Exercise D: Pronation    1. Sit with your left / right forearm supported on a table. Your elbow should be at waist height.  2. Rest your hand over the edge of the table, palm-up.  3. Gently grasp a lightweight hammer near the head. As this exercise gets easier for you, try holding the hammer farther down the handle.  4. Without moving your elbow, slowly rotate your palm down.  5. Hold this position for __________ seconds.  6. Slowly return to the starting position.  Repeat __________ times. Complete this exercise __________ times a day.  Exercise E: Elbow flexion, supinated    1. Sit on a stable chair without armrests, or stand.  2. If directed, hold a __________ weight in your left / right hand, or hold an exercise band with both hands. Your palms should face up toward the ceiling at the starting position.  3. Bend your left / right elbow and move your hand up toward your shoulder. Keep your other arm straight down, in the starting position.  4. Slowly return to the starting position.  Repeat __________ times. Complete this   exercise __________ times a day.  Exercise F: Elbow extension    1. Lie on your back.  2. Hold a __________ weight in your left / right hand.  3. Bend your left / right elbow to an "L" shape (90 degrees) so your elbow is pointed up to the ceiling and the weight is overhead.  4. Straighten your elbow, raising your hand toward the ceiling. Use your other hand to support your left / right upper arm and to keep it still.  5. Slowly return to the starting position.  Repeat __________ times. Complete this exercise __________ times a day.  This information is not intended to replace advice  given to you by your health care provider. Make sure you discuss any questions you have with your health care provider.  Document Released: 02/05/2005 Document Revised: 10/13/2015 Document Reviewed: 01/14/2015  [employer]vier Interactive Patient Education © 2019 [employer]

## 2018-04-09 ENCOUNTER — Encounter: Payer: Self-pay | Admitting: Physician Assistant

## 2018-08-28 ENCOUNTER — Encounter: Payer: Self-pay | Admitting: Physician Assistant

## 2018-09-16 ENCOUNTER — Ambulatory Visit (INDEPENDENT_AMBULATORY_CARE_PROVIDER_SITE_OTHER): Payer: No Typology Code available for payment source | Admitting: Physician Assistant

## 2018-09-16 ENCOUNTER — Other Ambulatory Visit: Payer: Self-pay

## 2018-09-16 ENCOUNTER — Encounter: Payer: Self-pay | Admitting: Physician Assistant

## 2018-09-16 VITALS — BP 106/71 | HR 58 | Temp 98.3°F | Ht 59.5 in | Wt 140.0 lb

## 2018-09-16 DIAGNOSIS — G4489 Other headache syndrome: Secondary | ICD-10-CM | POA: Insufficient documentation

## 2018-09-16 DIAGNOSIS — L659 Nonscarring hair loss, unspecified: Secondary | ICD-10-CM | POA: Diagnosis not present

## 2018-09-16 DIAGNOSIS — R634 Abnormal weight loss: Secondary | ICD-10-CM | POA: Diagnosis not present

## 2018-09-16 NOTE — Progress Notes (Signed)
Subjective:    Patient ID: [name] [name], female    DOB: Jul 05, 1980, 38 y.o.   MRN: [id number]  HPI  Pt is a 38 yo female who presents to the clinic with hair loss and headaches over the past month. She is working really hard on weight loss with weight watchers. She is down from 169 to 140 since feb 2020. She exercises some but just a few times a week. She signed up to row but going only about once a week. She has noticed some progress hair loss. No patches just diffuse. She is not taking any vitamins. Her headache is dull just noticing more often. No n/v/d, vision changes, light or sound sensitivity.    .. Active Ambulatory Problems    Diagnosis Date Noted  . Migraines 03/24/2011  . Anemia 01/31/2012  . Malpositioned IUD 04/01/2012  . History of abnormal Pap smear 08/13/2011  . History of depression 08/13/2011  . Obesity (BMI 30.0-34.9) 01/12/2013  . Stress due to illness of family member 11/29/2013  . Acne vulgaris 01/11/2014  . Binge eating disorder 04/21/2014  . GERD (gastroesophageal reflux disease) 04/21/2014  . GAD (generalized anxiety disorder) 06/22/2014  . PTSD (post-traumatic stress disorder) 06/22/2014  . Pregnancy 10/18/2014  . Normal labor 04/21/2015  . Normal vaginal delivery 04/22/2015  . Class 1 obesity due to excess calories without serious comorbidity with body mass index (BMI) of 34.0 to 34.9 in adult 11/22/2015  . Anxiety 12/14/2016  . Chronic upper back pain 12/16/2016  . Other headache syndrome 09/16/2018  . Weight loss 09/16/2018  . Hair loss 09/16/2018   Resolved Ambulatory Problems    Diagnosis Date Noted  . Edema in pregnancy 01/14/2012  . Mild hypertension 01/14/2012  . Status post primary low transverse cesarean section 01/30/2012  . Acute appendicitis s/p lap appy 04/01/2012 04/01/2012  . Postsurgical fever 04/07/2012  . Acute upper respiratory infection 04/26/2014  . Left ankle sprain 07/01/2014   Past Medical History:  Diagnosis Date  .  Breast hypertrophy 01/2016  . Runny nose 01/26/2016         Review of Systems See HPI.     Objective:   Physical Exam Vitals signs reviewed.  Constitutional:      Appearance: She is well-developed.  HENT:     Head: Normocephalic.     Comments: No patches of hair loss. Seemed to be thinning around the temporal area Cardiovascular:     Rate and Rhythm: Normal rate and regular rhythm.  Pulmonary:     Effort: Pulmonary effort is normal.  Neurological:     Mental Status: She is alert.  Psychiatric:        Mood and Affect: Mood normal.        Behavior: Behavior normal.           Assessment & Plan:  Marland KitchenMarland KitchenKalianna was seen today for headache and alopecia.  Diagnoses and all orders for this visit:  Hair loss -     CBC with Differential/Platelet -     COMPLETE METABOLIC PANEL WITH GFR -     B12 and Folate Panel -     VITAMIN D 25 Hydroxy (Vit-D Deficiency, Fractures) -     TSH -     Fe+TIBC+Fer  Weight loss -     CBC with Differential/Platelet -     COMPLETE METABOLIC PANEL WITH GFR -     B12 and Folate Panel -     VITAMIN D 25 Hydroxy (Vit-D Deficiency, Fractures) -  TSH -     Fe+TIBC+Fer  Other headache syndrome   She is actively trying to lose weight and down 30lbs. Likely hair loss is due to weight change. Will get labs. Suggested to start biotin.   Continue with healthy living.   Keep headache diary. Watch protein shakes and see if anything in them to induce headache. Watch out for neck strain due to working out. Use heating pad on neck and consider massage. Follow up as needed. No red flag symptoms.

## 2018-09-17 ENCOUNTER — Encounter: Payer: Self-pay | Admitting: Physician Assistant

## 2018-09-17 LAB — COMPLETE METABOLIC PANEL WITH GFR
AG Ratio: 1.7 (calc) (ref 1.0–2.5)
ALT: 11 U/L (ref 6–29)
AST: 16 U/L (ref 10–30)
Albumin: 4.4 g/dL (ref 3.6–5.1)
Alkaline phosphatase (APISO): 55 U/L (ref 31–125)
BUN: 17 mg/dL (ref 7–25)
CO2: 25 mmol/L (ref 20–32)
Calcium: 9.6 mg/dL (ref 8.6–10.2)
Chloride: 104 mmol/L (ref 98–110)
Creat: 0.63 mg/dL (ref 0.50–1.10)
GFR, Est African American: 134 mL/min/{1.73_m2} (ref >=60)
GFR, Est Non African American: 115 mL/min/{1.73_m2} (ref >=60)
Globulin: 2.6 g/dL (calc) (ref 1.9–3.7)
Glucose, Bld: 86 mg/dL (ref 65–99)
Potassium: 4.2 mmol/L (ref 3.5–5.3)
Sodium: 138 mmol/L (ref 135–146)
Total Bilirubin: 0.6 mg/dL (ref 0.2–1.2)
Total Protein: 7 g/dL (ref 6.1–8.1)

## 2018-09-17 LAB — CBC WITH DIFFERENTIAL/PLATELET
Absolute Monocytes: 378 cells/uL (ref 200–950)
Basophils Absolute: 18 cells/uL (ref 0–200)
Basophils Relative: 0.3 %
Eosinophils Absolute: 79 cells/uL (ref 15–500)
Eosinophils Relative: 1.3 %
HCT: 40.1 % (ref 35.0–45.0)
Hemoglobin: 13.7 g/dL (ref 11.7–15.5)
Lymphs Abs: 2147 cells/uL (ref 850–3900)
MCH: 30.5 pg (ref 27.0–33.0)
MCHC: 34.2 g/dL (ref 32.0–36.0)
MCV: 89.3 fL (ref 80.0–100.0)
MPV: 10.3 fL (ref 7.5–12.5)
Monocytes Relative: 6.2 %
Neutro Abs: 3477 cells/uL (ref 1500–7800)
Neutrophils Relative %: 57 %
Platelets: 188 10*3/uL (ref 140–400)
RBC: 4.49 10*6/uL (ref 3.80–5.10)
RDW: 13 % (ref 11.0–15.0)
Total Lymphocyte: 35.2 %
WBC: 6.1 10*3/uL (ref 3.8–10.8)

## 2018-09-17 LAB — VITAMIN D 25 HYDROXY (VIT D DEFICIENCY, FRACTURES): Vit D, 25-Hydroxy: 53 ng/mL (ref 30–100)

## 2018-09-17 LAB — TSH: TSH: 2.03 mIU/L

## 2018-09-17 LAB — IRON,TIBC AND FERRITIN PANEL
%SAT: 32 % (calc) (ref 16–45)
Ferritin: 15 ng/mL — ABNORMAL LOW (ref 16–154)
Iron: 108 ug/dL (ref 40–190)
TIBC: 340 mcg/dL (calc) (ref 250–450)

## 2018-09-17 LAB — B12 AND FOLATE PANEL
Folate: 13.3 ng/mL
Vitamin B-12: 487 pg/mL (ref 200–1100)

## 2018-09-17 NOTE — Progress Notes (Signed)
Iron stores are a little low. Start ferrous sulfate 325mg  daily with breakfast.   Vitamin D looks good.   No overt reason for hair loss. Start biotin with b12 daily and check back in 1-2 months to see how hair loss is doing and you are feeling.

## 2018-10-07 ENCOUNTER — Other Ambulatory Visit: Payer: No Typology Code available for payment source

## 2018-10-14 ENCOUNTER — Other Ambulatory Visit: Payer: No Typology Code available for payment source

## 2018-10-21 ENCOUNTER — Other Ambulatory Visit: Payer: Self-pay

## 2018-10-21 ENCOUNTER — Ambulatory Visit (INDEPENDENT_AMBULATORY_CARE_PROVIDER_SITE_OTHER): Payer: Self-pay

## 2018-10-21 DIAGNOSIS — Z719 Counseling, unspecified: Secondary | ICD-10-CM

## 2018-10-22 ENCOUNTER — Telehealth: Payer: Self-pay

## 2018-10-22 NOTE — Telephone Encounter (Signed)
Laser performed on left upper/thigh leg varicose veins- using ND Yag @ the following settings: blue/0.3- 0.20mm Laser performed on the perimeter of the vein complex in an attempt to obliterate the "feeder" veins to the complex to reduce the size & color of the larger veins in the complex - so that we can attempt to laser the complex at a later date. The pt understands that it may take several sessions to completely treat the vein cluster She was reminded to treat the area with suncreen

## 2018-10-24 ENCOUNTER — Encounter (INDEPENDENT_AMBULATORY_CARE_PROVIDER_SITE_OTHER): Payer: Self-pay | Admitting: Nurse Practitioner

## 2018-10-24 ENCOUNTER — Telehealth: Payer: No Typology Code available for payment source | Admitting: Nurse Practitioner

## 2018-10-24 DIAGNOSIS — B9789 Other viral agents as the cause of diseases classified elsewhere: Secondary | ICD-10-CM | POA: Diagnosis not present

## 2018-10-24 DIAGNOSIS — J019 Acute sinusitis, unspecified: Secondary | ICD-10-CM | POA: Diagnosis not present

## 2018-10-24 MED ORDER — CETIRIZINE HCL 10 MG PO TABS: 10.0000 mg | ORAL_TABLET | Freq: Every day | ORAL | 0 refills | Status: DC

## 2018-10-24 MED ORDER — PSEUDOEPH-BROMPHEN-DM 30-2-10 MG/5ML PO SYRP: 5.0000 mL | ORAL_SOLUTION | Freq: Four times a day (QID) | ORAL | 0 refills | Status: AC | PRN

## 2018-10-24 MED ORDER — FLUTICASONE PROPIONATE 50 MCG/ACT NA SUSP: 2.0000 | Freq: Every day | NASAL | 0 refills | Status: DC

## 2018-10-24 NOTE — Progress Notes (Signed)
We are sorry that you are not feeling well.  Here is how we plan to help!  Based on what you have shared with me it looks like you have sinusitis.  Sinusitis is inflammation and infection in the sinus cavities of the head.  Based on your presentation I believe you most likely have Acute Viral Sinusitis.This is an infection most likely caused by a virus. There is not specific treatment for viral sinusitis other than to help you with the symptoms until the infection runs its course.  You may use an oral decongestant such as Mucinex D or if you have glaucoma or high blood pressure use plain Mucinex. Saline nasal spray help and can safely be used as often as needed for congestion, I have prescribed: Fluticasone nasal spray two sprays in each nostril once a day, Ceterizine 10mg  daily for 10 days and Bromfed cough medication to take up to 4 times daily as needed. I do not believe you need an antibiotic at this time.  Some authorities believe that zinc sprays or the use of Echinacea may shorten the course of your symptoms.  Sinus infections are not as easily transmitted as other respiratory infection, however we still recommend that you avoid close contact with loved ones, especially the very young and elderly.  Remember to wash your hands thoroughly throughout the day as this is the number one way to prevent the spread of infection!  Home Care:  Only take medications as instructed by your medical team.  Do not take these medications with alcohol.  A steam or ultrasonic humidifier can help congestion.  You can place a towel over your head and breathe in the steam from hot water coming from a faucet.  Avoid close contacts especially the very young and the elderly.  Cover your mouth when you cough or sneeze.  Always remember to wash your hands.  Get Help Right Away If:  You develop worsening fever or sinus pain.  You develop a severe head ache or visual changes.  Your symptoms persist after you  have completed your treatment plan.  Make sure you  Understand these instructions.  Will watch your condition.  Will get help right away if you are not doing well or get worse.  Your e-visit answers were reviewed by a board certified advanced clinical practitioner to complete your personal care plan.  Depending on the condition, your plan could have included both over the counter or prescription medications.  If there is a problem please reply  once you have received a response from your provider.  Your safety is important to [place].  If you have drug allergies check your prescription carefully.    You can use MyChart to ask questions about today's visit, request a non-urgent call back, or ask for a work or school excuse for 24 hours related to this e-Visit. If it has been greater than 24 hours you will need to follow up with your provider, or enter a new e-Visit to address those concerns.  You will get an e-mail in the next two days asking about your experience.  I hope that your e-visit has been valuable and will speed your recovery. Thank you for using e-visits.

## 2018-10-31 NOTE — Progress Notes (Signed)
Approximately 5 minutes were spent reviewing and documenting in the patient's chart. 

## 2018-11-07 ENCOUNTER — Other Ambulatory Visit: Payer: Self-pay | Admitting: Nurse Practitioner

## 2018-11-07 DIAGNOSIS — J019 Acute sinusitis, unspecified: Secondary | ICD-10-CM

## 2018-11-07 DIAGNOSIS — B9789 Other viral agents as the cause of diseases classified elsewhere: Secondary | ICD-10-CM

## 2019-01-10 ENCOUNTER — Telehealth: Payer: No Typology Code available for payment source | Admitting: Nurse Practitioner

## 2019-01-10 DIAGNOSIS — J0101 Acute recurrent maxillary sinusitis: Secondary | ICD-10-CM

## 2019-01-10 MED ORDER — AMOXICILLIN-POT CLAVULANATE 875-125 MG PO TABS: 1.0000 | ORAL_TABLET | Freq: Two times a day (BID) | ORAL | 0 refills | Status: DC

## 2019-01-10 NOTE — Progress Notes (Signed)

## 2019-01-21 DIAGNOSIS — Z719 Counseling, unspecified: Secondary | ICD-10-CM | POA: Insufficient documentation

## 2019-01-21 NOTE — Progress Notes (Signed)
Laser done on legs

## 2019-03-24 ENCOUNTER — Encounter: Payer: Self-pay | Admitting: Physician Assistant

## 2019-03-24 ENCOUNTER — Ambulatory Visit (INDEPENDENT_AMBULATORY_CARE_PROVIDER_SITE_OTHER): Payer: No Typology Code available for payment source | Admitting: Physician Assistant

## 2019-03-24 ENCOUNTER — Other Ambulatory Visit: Payer: Self-pay

## 2019-03-24 VITALS — BP 110/67 | HR 53 | Wt 142.0 lb

## 2019-03-24 DIAGNOSIS — N6320 Unspecified lump in the left breast, unspecified quadrant: Secondary | ICD-10-CM | POA: Diagnosis not present

## 2019-03-24 DIAGNOSIS — F458 Other somatoform disorders: Secondary | ICD-10-CM | POA: Diagnosis not present

## 2019-03-24 DIAGNOSIS — R519 Headache, unspecified: Secondary | ICD-10-CM | POA: Insufficient documentation

## 2019-03-24 DIAGNOSIS — K219 Gastro-esophageal reflux disease without esophagitis: Secondary | ICD-10-CM | POA: Diagnosis not present

## 2019-03-24 DIAGNOSIS — Z803 Family history of malignant neoplasm of breast: Secondary | ICD-10-CM | POA: Diagnosis not present

## 2019-03-24 DIAGNOSIS — N644 Mastodynia: Secondary | ICD-10-CM

## 2019-03-24 MED ORDER — PANTOPRAZOLE SODIUM 40 MG PO TBEC: 40.0000 mg | DELAYED_RELEASE_TABLET | Freq: Every day | ORAL | 4 refills | Status: DC

## 2019-03-24 MED ORDER — BACLOFEN 10 MG PO TABS: 10.0000 mg | ORAL_TABLET | Freq: Every day | ORAL | 2 refills | Status: DC

## 2019-03-24 MED FILL — BACLOFEN 10 MG TABS: 10 | 30 days supply | Qty: 30 | Fill #0

## 2019-03-24 MED FILL — PANTOPRAZOLE SOD DR 40 MG T: 40 | 90 days supply | Qty: 90 | Fill #0

## 2019-03-24 NOTE — Progress Notes (Signed)
Subjective:    Patient ID: [name] [name], female    DOB: [birth year], 39 y.o.   MRN: [id number]  HPI  Pt is a 39 yo female who presents to the clinic with new left breast pain and persistent mass in left upper outer quadrant. Pt does have family hx of BC with paternal grandmother and paternal aunts. She has had intermittent breast tenderness for last 6 years and dense breast tissue. She had mammogram 6 years ago in 2015 that showed no abnormalities. Yesterday she had a sudden sharp, pulling pain through her left nipple that lasted a few minutes and then stopped. No nipple discharge or retraction. The pain did double her over and cause her to rest. She continues to feel mass in left upper outer breast and seems to be getting bigger. She has mirena for OCP.   She also admits to more frequent headaches. She is having 2-3 a week but most are controlled with ibuprofen/tylenol. She admits to grinding teeth at night and neck tightness and stiffness. Headache seems to be right parietal/temporal head. No vision changes, sound or light sensitivity. She at times does get a little nauseated. No known triggers.  .. Family History  Problem Relation Age of Onset  . Migraines Father        IN THE PAST  . Alzheimer's disease Father        DX'D @ 29  . Migraines Paternal Grandfather   . Thyroid disease Mother   . Breast cancer Paternal Aunt   . Cancer Paternal Aunt   . Breast cancer Maternal Grandmother   . Cancer Maternal Grandmother        breast cancer      Review of Systems See HPI.     Objective:   Physical Exam Vitals reviewed.  Constitutional:      Appearance: Normal appearance.  HENT:     Head: Normocephalic.  Eyes:     Pupils: Pupils are equal, round, and reactive to light.  Chest:    Neurological:     General: No focal deficit present.     Mental Status: She is alert.  Psychiatric:        Mood and Affect: Mood normal.           Assessment & Plan:  Marland KitchenMarland KitchenNajah was seen  today for breast pain.  Diagnoses and all orders for this visit:  Left breast mass -     MM DIAG BREAST TOMO BILATERAL -     US BREAST LTD UNI LEFT INC AXILLA  Gastroesophageal reflux disease -     pantoprazole (PROTONIX) 40 MG tablet; Take 1 tablet (40 mg total) by mouth daily.  Family history of breast cancer -     MM DIAG BREAST TOMO BILATERAL -     US BREAST LTD UNI LEFT INC AXILLA  Teeth grinding -     baclofen (LIORESAL) 10 MG tablet; Take 1 tablet (10 mg total) by mouth at bedtime.  Frequent headaches -     baclofen (LIORESAL) 10 MG tablet; Take 1 tablet (10 mg total) by mouth at bedtime.  Breast pain, left -     MM DIAG BREAST TOMO BILATERAL -     US BREAST LTD UNI LEFT INC AXILLA   Needs imaging for breast. Pt does not have 1st degree relative and would not qualify for myraid testing.   HA sound more tension than migraine. Consider baclofen at bedtime. Consider massage focused on head and neck. Discussed heat  with rice bag/tens unit/icy hot and as needed NSAIDs. Follow up in 4-6 weeks.

## 2019-03-24 NOTE — Patient Instructions (Signed)
Will order mammogram.  Start baclofen at bedtime.

## 2019-03-25 DIAGNOSIS — N644 Mastodynia: Secondary | ICD-10-CM | POA: Insufficient documentation

## 2019-03-31 ENCOUNTER — Other Ambulatory Visit: Payer: Self-pay

## 2019-03-31 ENCOUNTER — Ambulatory Visit
Admission: RE | Admit: 2019-03-31 | Discharge: 2019-03-31 | Disposition: A | Discharge status: Home / Self Care | Payer: No Typology Code available for payment source | Source: Ambulatory Visit | Attending: Physician Assistant | Admitting: Physician Assistant

## 2019-03-31 NOTE — Progress Notes (Signed)
Normal mammogram. Great news. Screening mammos start next year.

## 2019-04-02 ENCOUNTER — Other Ambulatory Visit: Payer: No Typology Code available for payment source

## 2019-04-21 ENCOUNTER — Encounter: Payer: No Typology Code available for payment source | Admitting: Physician Assistant

## 2019-06-11 ENCOUNTER — Telehealth: Payer: No Typology Code available for payment source | Admitting: Physician Assistant

## 2019-06-11 DIAGNOSIS — J019 Acute sinusitis, unspecified: Secondary | ICD-10-CM

## 2019-06-11 DIAGNOSIS — B9689 Other specified bacterial agents as the cause of diseases classified elsewhere: Secondary | ICD-10-CM | POA: Diagnosis not present

## 2019-06-11 MED ORDER — AMOXICILLIN-POT CLAVULANATE 875-125 MG PO TABS: 1.0000 | ORAL_TABLET | Freq: Two times a day (BID) | ORAL | 0 refills | Status: DC

## 2019-06-11 NOTE — Progress Notes (Signed)
We are sorry that you are not feeling well.  Here is how we plan to help!  Based on what you have shared with me it looks like you have sinusitis.  Sinusitis is inflammation and infection in the sinus cavities of the head.  Based on your presentation I believe you most likely have Acute Bacterial Sinusitis.  This is an infection caused by bacteria and is treated with antibiotics. Please follow-up with your PCP   I have prescribed Augmentin 875mg /125mg  one tablet twice daily with food, for 7 days. You may use an oral decongestant such as Mucinex D or if you have glaucoma or high blood pressure use plain Mucinex. Saline nasal spray help and can safely be used as often as needed for congestion.  If you develop worsening sinus pain, fever or notice severe headache and vision changes, or if symptoms are not better after completion of antibiotic, please schedule an appointment with a health care provider.    Sinus infections are not as easily transmitted as other respiratory infection, however we still recommend that you avoid close contact with loved ones, especially the very young and elderly.  Remember to wash your hands thoroughly throughout the day as this is the number one way to prevent the spread of infection!  Home Care:  Only take medications as instructed by your medical team.  Complete the entire course of an antibiotic.  Do not take these medications with alcohol.  A steam or ultrasonic humidifier can help congestion.  You can place a towel over your head and breathe in the steam from hot water coming from a faucet.  Avoid close contacts especially the very young and the elderly.  Cover your mouth when you cough or sneeze.  Always remember to wash your hands.  Get Help Right Away If:  You develop worsening fever or sinus pain.  You develop a severe head ache or visual changes.  Your symptoms persist after you have completed your treatment plan.  Make sure you  Understand  these instructions.  Will watch your condition.  Will get help right away if you are not doing well or get worse.  Your e-visit answers were reviewed by a board certified advanced clinical practitioner to complete your personal care plan.  Depending on the condition, your plan could have included both over the counter or prescription medications.  If there is a problem please reply  once you have received a response from your provider.  Your safety is important to .  If you have drug allergies check your prescription carefully.    You can use MyChart to ask questions about today's visit, request a non-urgent call back, or ask for a work or school excuse for 24 hours related to this e-Visit. If it has been greater than 24 hours you will need to follow up with your provider, or enter a new e-Visit to address those concerns.  You will get an e-mail in the next two days asking about your experience.  I hope that your e-visit has been valuable and will speed your recovery. Thank you for using e-visits.   Greater than 5 minutes, yet less than 10 minutes of time have been spent researching, coordinating and implementing care for this patient today.

## 2019-06-28 ENCOUNTER — Telehealth: Payer: No Typology Code available for payment source | Admitting: Family

## 2019-06-28 DIAGNOSIS — H109 Unspecified conjunctivitis: Secondary | ICD-10-CM

## 2019-06-28 DIAGNOSIS — J301 Allergic rhinitis due to pollen: Secondary | ICD-10-CM

## 2019-06-28 MED ORDER — POLYMYXIN B-TRIMETHOPRIM 10000-0.1 UNIT/ML-% OP SOLN: 1.0000 [drp] | Freq: Four times a day (QID) | OPHTHALMIC | 0 refills | Status: DC

## 2019-06-28 MED ORDER — CETIRIZINE HCL 10 MG PO TABS: 10.0000 mg | ORAL_TABLET | Freq: Every day | ORAL | 11 refills | Status: DC

## 2019-06-28 MED ORDER — FLUTICASONE PROPIONATE 50 MCG/ACT NA SUSP: 2.0000 | Freq: Every day | NASAL | 6 refills | Status: DC

## 2019-06-28 NOTE — Progress Notes (Unsigned)
E-Visit for [name] [name]   We are sorry that you are not feeling well.  Here is how we plan to help!  Based on what you have shared with me it looks like you have conjunctivitis.  Conjunctivitis is a common inflammatory or infectious condition of the eye that is often referred to as "pink eye".  In most cases it is contagious (viral or bacterial). However, not all conjunctivitis requires antibiotics (ex. Allergic).  We have made appropriate suggestions for you based upon your presentation.  I have prescribed Polytrim Ophthalmic drops 1-2 drops 4 times a day times 5 days . Please start Zyrtec and flonase.   Pink eye can be highly contagious.  It is typically spread through direct contact with secretions, or contaminated objects or surfaces that one may have touched.  Strict handwashing is suggested with soap and water is urged.  If not available, use alcohol based had sanitizer.  Avoid unnecessary touching of the eye.  If you wear contact lenses, you will need to refrain from wearing them until you see no white discharge from the eye for at least 24 hours after being on medication.  You should see symptom improvement in 1-2 days after starting the medication regimen.  Call us if symptoms are not improved in 1-2 days.  Home Care:  Wash your hands often!  Do not wear your contacts until you complete your treatment plan.  Avoid sharing towels, bed linen, personal items with a person who has pink eye.  See attention for anyone in your home with similar symptoms.  Get Help Right Away If:  Your symptoms do not improve.  You develop blurred or loss of vision.  Your symptoms worsen (increased discharge, pain or redness)  Your e-visit answers were reviewed by a board certified advanced clinical practitioner to complete your personal care plan.  Depending on the condition, your plan could have included both over the counter or prescription medications.  If there is a problem please reply  once you  have received a response from your provider.  Your safety is important to [place].  If you have drug allergies check your prescription carefully.    You can use MyChart to ask questions about today's visit, request a non-urgent call back, or ask for a work or school excuse for 24 hours related to this e-Visit. If it has been greater than 24 hours you will need to follow up with your provider, or enter a new e-Visit to address those concerns.   You will get an e-mail in the next two days asking about your experience.  I hope that your e-visit has been valuable and will speed your recovery. Thank you for using e-visits.   Approximately 5 minutes was spent documenting and reviewing patient's chart.

## 2019-07-08 MED FILL — PANTOPRAZOLE SOD DR 40 MG T: 40 | 90 days supply | Qty: 90 | Fill #1

## 2019-09-02 ENCOUNTER — Other Ambulatory Visit: Payer: Self-pay | Admitting: Physician Assistant

## 2019-09-02 ENCOUNTER — Encounter: Payer: Self-pay | Admitting: Physician Assistant

## 2019-09-02 DIAGNOSIS — F419 Anxiety disorder, unspecified: Secondary | ICD-10-CM

## 2019-09-02 MED ORDER — LORAZEPAM 0.5 MG PO TABS: 0.5000 mg | ORAL_TABLET | Freq: Three times a day (TID) | ORAL | 2 refills | Status: DC | PRN

## 2019-09-02 MED FILL — LORazepam 0.5 MG TABS: 0.5 | 10 days supply | Qty: 30 | Fill #0

## 2019-09-02 NOTE — Telephone Encounter (Signed)
Last filled 08/13/2017 #30 with 2 refills.  Last seen 03/24/19.

## 2019-09-02 NOTE — Telephone Encounter (Signed)
Last OV - 03/24/19 Last written - 08/13/17

## 2019-09-10 ENCOUNTER — Ambulatory Visit: Payer: No Typology Code available for payment source | Admitting: Nurse Practitioner

## 2019-09-11 ENCOUNTER — Ambulatory Visit: Payer: No Typology Code available for payment source | Admitting: Physician Assistant

## 2019-09-15 ENCOUNTER — Ambulatory Visit: Payer: No Typology Code available for payment source | Admitting: Medical-Surgical

## 2019-10-06 ENCOUNTER — Other Ambulatory Visit (HOSPITAL_COMMUNITY)
Admission: RE | Admit: 2019-10-06 | Discharge: 2019-10-06 | Disposition: A | Discharge status: Home / Self Care | Payer: No Typology Code available for payment source | Source: Ambulatory Visit | Attending: Physician Assistant | Admitting: Physician Assistant

## 2019-10-06 ENCOUNTER — Ambulatory Visit (INDEPENDENT_AMBULATORY_CARE_PROVIDER_SITE_OTHER): Payer: No Typology Code available for payment source | Admitting: Physician Assistant

## 2019-10-06 VITALS — BP 117/73 | HR 57 | Ht 59.0 in | Wt 147.0 lb

## 2019-10-06 DIAGNOSIS — R5383 Other fatigue: Secondary | ICD-10-CM

## 2019-10-06 DIAGNOSIS — Z124 Encounter for screening for malignant neoplasm of cervix: Secondary | ICD-10-CM | POA: Diagnosis not present

## 2019-10-06 DIAGNOSIS — Z1159 Encounter for screening for other viral diseases: Secondary | ICD-10-CM

## 2019-10-06 DIAGNOSIS — J301 Allergic rhinitis due to pollen: Secondary | ICD-10-CM

## 2019-10-06 DIAGNOSIS — Z0000 Encounter for general adult medical examination without abnormal findings: Secondary | ICD-10-CM

## 2019-10-06 DIAGNOSIS — Z13220 Encounter for screening for lipoid disorders: Secondary | ICD-10-CM

## 2019-10-06 DIAGNOSIS — Z131 Encounter for screening for diabetes mellitus: Secondary | ICD-10-CM

## 2019-10-06 DIAGNOSIS — L700 Acne vulgaris: Secondary | ICD-10-CM

## 2019-10-06 DIAGNOSIS — E559 Vitamin D deficiency, unspecified: Secondary | ICD-10-CM | POA: Diagnosis not present

## 2019-10-06 MED ORDER — TRETINOIN 0.1 % EX CREA: TOPICAL_CREAM | Freq: Every day | CUTANEOUS | 1 refills | Status: DC

## 2019-10-06 MED ORDER — FLUTICASONE PROPIONATE 50 MCG/ACT NA SUSP: 2.0000 | Freq: Every day | NASAL | 1 refills | Status: DC

## 2019-10-06 MED FILL — FLUTICASONE PROP 50 MCG SPR: 50 | 30 days supply | Qty: 16 | Fill #0

## 2019-10-06 NOTE — Patient Instructions (Signed)
Health Maintenance, Female Adopting a healthy lifestyle and getting preventive care are important in promoting health and wellness. Ask your health care provider about:  The right schedule for you to have regular tests and exams.  Things you can do on your own to prevent diseases and keep yourself healthy. What should I know about diet, weight, and exercise? Eat a healthy diet   Eat a diet that includes plenty of vegetables, fruits, low-fat dairy products, and lean protein.  Do not eat a lot of foods that are high in solid fats, added sugars, or sodium. Maintain a healthy weight Body mass index (BMI) is used to identify weight problems. It estimates body fat based on height and weight. Your health care provider can help determine your BMI and help you achieve or maintain a healthy weight. Get regular exercise Get regular exercise. This is one of the most important things you can do for your health. Most adults should:  Exercise for at least 150 minutes each week. The exercise should increase your heart rate and make you sweat (moderate-intensity exercise).  Do strengthening exercises at least twice a week. This is in addition to the moderate-intensity exercise.  Spend less time sitting. Even light physical activity can be beneficial. Watch cholesterol and blood lipids Have your blood tested for lipids and cholesterol at 37 years of age, then have this test every 5 years. Have your cholesterol levels checked more often if:  Your lipid or cholesterol levels are high.  You are older than 37 years of age.  You are at high risk for heart disease. What should I know about cancer screening? Depending on your health history and family history, you may need to have cancer screening at various ages. This may include screening for:  Breast cancer.  Cervical cancer.  Colorectal cancer.  Skin cancer.  Lung cancer. What should I know about heart disease, diabetes, and high blood  pressure? Blood pressure and heart disease  High blood pressure causes heart disease and increases the risk of stroke. This is more likely to develop in people who have high blood pressure readings, are of African descent, or are overweight.  Have your blood pressure checked: ? Every 3-5 years if you are 18-39 years of age. ? Every year if you are 40 years old or older. Diabetes Have regular diabetes screenings. This checks your fasting blood sugar level. Have the screening done:  Once every three years after age 40 if you are at a normal weight and have a low risk for diabetes.  More often and at a younger age if you are overweight or have a high risk for diabetes. What should I know about preventing infection? Hepatitis B If you have a higher risk for hepatitis B, you should be screened for this virus. Talk with your health care provider to find out if you are at risk for hepatitis B infection. Hepatitis C Testing is recommended for:  Everyone born from 1945 through 1965.  Anyone with known risk factors for hepatitis C. Sexually transmitted infections (STIs)  Get screened for STIs, including gonorrhea and chlamydia, if: ? You are sexually active and are younger than 37 years of age. ? You are older than 37 years of age and your health care provider tells you that you are at risk for this type of infection. ? Your sexual activity has changed since you were last screened, and you are at increased risk for chlamydia or gonorrhea. Ask your health care provider if   you are at risk.  Ask your health care provider about whether you are at high risk for HIV. Your health care provider may recommend a prescription medicine to help prevent HIV infection. If you choose to take medicine to prevent HIV, you should first get tested for HIV. You should then be tested every 3 months for as long as you are taking the medicine. Pregnancy  If you are about to stop having your period (premenopausal) and  you may become pregnant, seek counseling before you get pregnant.  Take 400 to 800 micrograms (mcg) of folic acid every day if you become pregnant.  Ask for birth control (contraception) if you want to prevent pregnancy. Osteoporosis and menopause Osteoporosis is a disease in which the bones lose minerals and strength with aging. This can result in bone fractures. If you are 65 years old or older, or if you are at risk for osteoporosis and fractures, ask your health care provider if you should:  Be screened for bone loss.  Take a calcium or vitamin D supplement to lower your risk of fractures.  Be given hormone replacement therapy (HRT) to treat symptoms of menopause. Follow these instructions at home: Lifestyle  Do not use any products that contain nicotine or tobacco, such as cigarettes, e-cigarettes, and chewing tobacco. If you need help quitting, ask your health care provider.  Do not use street drugs.  Do not share needles.  Ask your health care provider for help if you need support or information about quitting drugs. Alcohol use  Do not drink alcohol if: ? Your health care provider tells you not to drink. ? You are pregnant, may be pregnant, or are planning to become pregnant.  If you drink alcohol: ? Limit how much you use to 0-1 drink a day. ? Limit intake if you are breastfeeding.  Be aware of how much alcohol is in your drink. In the [place] one drink equals one 12 oz bottle of beer (355 mL), one 5 oz glass of wine (148 mL), or one 1 oz glass of hard liquor (44 mL). General instructions  Schedule regular health, dental, and eye exams.  Stay current with your vaccines.  Tell your health care provider if: ? You often feel depressed. ? You have ever been abused or do not feel safe at home. Summary  Adopting a healthy lifestyle and getting preventive care are important in promoting health and wellness.  Follow your health care provider's instructions about healthy  diet, exercising, and getting tested or screened for diseases.  Follow your health care provider's instructions on monitoring your cholesterol and blood pressure. This information is not intended to replace advice given to you by your health care provider. Make sure you discuss any questions you have with your health care provider. Document Revised: 01/29/2018 Document Reviewed: 01/29/2018 [employer] Patient Education  2020 [employer]  
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
## 2019-10-07 LAB — CYTOLOGY - PAP
Comment: NEGATIVE
Diagnosis: NEGATIVE
High risk HPV: NEGATIVE

## 2019-10-07 LAB — CBC
HCT: 39.4 % (ref 35.0–45.0)
Hemoglobin: 13.4 g/dL (ref 11.7–15.5)
MCH: 31.2 pg (ref 27.0–33.0)
MCHC: 34 g/dL (ref 32.0–36.0)
MCV: 91.8 fL (ref 80.0–100.0)
MPV: 10.4 fL (ref 7.5–12.5)
Platelets: 208 10*3/uL (ref 140–400)
RBC: 4.29 10*6/uL (ref 3.80–5.10)
RDW: 11.8 % (ref 11.0–15.0)
WBC: 5.5 10*3/uL (ref 3.8–10.8)

## 2019-10-07 LAB — LIPID PANEL W/REFLEX DIRECT LDL
Cholesterol: 182 mg/dL (ref <200)
HDL: 55 mg/dL (ref >=50)
LDL Cholesterol (Calc): 113 mg/dL (calc) — ABNORMAL HIGH
Non-HDL Cholesterol (Calc): 127 mg/dL (calc) (ref <130)
Total CHOL/HDL Ratio: 3.3 (calc) (ref <5.0)
Triglycerides: 48 mg/dL (ref <150)

## 2019-10-07 LAB — COMPLETE METABOLIC PANEL WITH GFR
AG Ratio: 1.8 (calc) (ref 1.0–2.5)
ALT: 13 U/L (ref 6–29)
AST: 18 U/L (ref 10–30)
Albumin: 4.2 g/dL (ref 3.6–5.1)
Alkaline phosphatase (APISO): 59 U/L (ref 31–125)
BUN: 16 mg/dL (ref 7–25)
CO2: 28 mmol/L (ref 20–32)
Calcium: 9.4 mg/dL (ref 8.6–10.2)
Chloride: 101 mmol/L (ref 98–110)
Creat: 0.61 mg/dL (ref 0.50–1.10)
GFR, Est African American: 134 mL/min/{1.73_m2} (ref >=60)
GFR, Est Non African American: 116 mL/min/{1.73_m2} (ref >=60)
Globulin: 2.3 g/dL (calc) (ref 1.9–3.7)
Glucose, Bld: 80 mg/dL (ref 65–99)
Potassium: 3.5 mmol/L (ref 3.5–5.3)
Sodium: 137 mmol/L (ref 135–146)
Total Bilirubin: 0.8 mg/dL (ref 0.2–1.2)
Total Protein: 6.5 g/dL (ref 6.1–8.1)

## 2019-10-07 LAB — HEPATITIS C ANTIBODY
Hepatitis C Ab: NONREACTIVE
SIGNAL TO CUT-OFF: 0.01 (ref <1.00)

## 2019-10-07 LAB — TSH: TSH: 1.81 mIU/L

## 2019-10-07 LAB — B12 AND FOLATE PANEL
Folate: 16.1 ng/mL
Vitamin B-12: 504 pg/mL (ref 200–1100)

## 2019-10-07 LAB — VITAMIN D 25 HYDROXY (VIT D DEFICIENCY, FRACTURES): Vit D, 25-Hydroxy: 54 ng/mL (ref 30–100)

## 2019-10-07 NOTE — Progress Notes (Signed)
Omari,  Cholesterol looks good. HDL improved which is GREAT. LDL up a little but still in good range just not optimal range.  Kidney, liver, glucose look great.  Thyroid wonderful.  Vitamin D perfect.  No anemia.  B12 great.   Wonderful labs.

## 2019-10-07 NOTE — Progress Notes (Signed)
Normal pap. Negative HPV. No abnormal cells. You could go 5 years at this point.

## 2019-10-08 MED FILL — LORazepam 0.5 MG TABS: 0.5 | 10 days supply | Qty: 30 | Fill #1

## 2019-10-08 MED FILL — PANTOPRAZOLE SOD DR 40 MG T: 40 | 90 days supply | Qty: 90 | Fill #2

## 2019-10-09 ENCOUNTER — Encounter: Payer: Self-pay | Admitting: Physician Assistant

## 2019-10-09 DIAGNOSIS — R5383 Other fatigue: Secondary | ICD-10-CM | POA: Insufficient documentation

## 2019-10-09 DIAGNOSIS — E559 Vitamin D deficiency, unspecified: Secondary | ICD-10-CM | POA: Insufficient documentation

## 2019-10-15 ENCOUNTER — Telehealth: Payer: Self-pay | Admitting: Physician Assistant

## 2019-10-15 NOTE — Telephone Encounter (Signed)
Received fax for PA on Retin - A sent through cover my meds waiting on determination. - CF

## 2019-10-19 NOTE — Telephone Encounter (Signed)
Received fax form with additional questions for PA filled out and faxed back to [employer] still waiting on determination. - CF

## 2019-10-27 NOTE — Telephone Encounter (Signed)
Received fax from [employer] and they denied coverage on brand Retin-A. They did however approve the generic Tretinoin effective 10/21/2019 - 10/19/2020. - CF

## 2019-10-30 ENCOUNTER — Encounter: Payer: Self-pay | Admitting: Physician Assistant

## 2019-11-02 MED FILL — RETIN-A 0.1 % CREA: 0.1 | 30 days supply | Qty: 45 | Fill #0

## 2019-11-23 ENCOUNTER — Encounter (INDEPENDENT_AMBULATORY_CARE_PROVIDER_SITE_OTHER): Payer: Self-pay

## 2020-02-20 ENCOUNTER — Telehealth: Payer: No Typology Code available for payment source | Admitting: Nurse Practitioner

## 2020-02-20 DIAGNOSIS — J020 Streptococcal pharyngitis: Secondary | ICD-10-CM

## 2020-02-20 MED ORDER — AMOXICILLIN 500 MG PO CAPS
500.0000 mg | ORAL_CAPSULE | Freq: Three times a day (TID) | ORAL | 0 refills | Status: DC
Start: 2020-02-20 — End: 2020-12-22

## 2020-02-20 NOTE — Progress Notes (Signed)

## 2020-03-08 ENCOUNTER — Telehealth: Payer: Self-pay | Admitting: Family

## 2020-03-08 DIAGNOSIS — Z20822 Contact with and (suspected) exposure to covid-19: Secondary | ICD-10-CM

## 2020-03-08 MED ORDER — BENZONATATE 100 MG PO CAPS: 100.0000 mg | ORAL_CAPSULE | Freq: Three times a day (TID) | ORAL | 0 refills | Status: DC | PRN

## 2020-03-08 MED ORDER — FLUTICASONE PROPIONATE 50 MCG/ACT NA SUSP: 2.0000 | Freq: Every day | NASAL | 6 refills | Status: AC

## 2020-03-08 MED ORDER — DEXAMETHASONE 6 MG PO TABS: 6.0000 mg | ORAL_TABLET | Freq: Two times a day (BID) | ORAL | 0 refills | Status: DC

## 2020-03-08 NOTE — Progress Notes (Signed)
E-Visit for Corona Virus Screening  Your current symptoms could be consistent with the coronavirus.  Many health care providers can now test patients at their office but not all are.  [place] has multiple testing sites. For information on our COVID testing locations and hours go to [email or web address]  We are enrolling you in our MyChart Home Monitoring for COVID19 . Daily you will receive a questionnaire within the MyChart website. Our COVID 19 response team will be monitoring your responses daily.  Testing Information: The COVID-19 Community Testing sites are testing BY APPOINTMENT ONLY.  You can schedule online at [email or web address]  If you do not have access to a smart phone or computer you may call [phone] for an appointment.   Additional testing sites in the Community:  . For [facility] Testing sites in [facility]  [email or web address]  . For Pop-up testing sites in [place]  [email or web address]  . For [facility] [email or web address]  . For [facility] testing in [place] and [employer] [email or web address]  . For [facility] testing in [facility]   [email or web address]  For  more information about community testing call [phone]   Please quarantine yourself while awaiting your test results. Please stay home for a minimum of 10 days from the first day of illness with improving symptoms and you have had 24 hours of no fever (without the use of Tylenol (Acetaminophen) Motrin (Ibuprofen) or any fever reducing medication).  Also - Do not get tested prior to returning to work because once you have had a positive test the test can stay  positive for more than a month in some cases.   You should wear a mask or cloth face covering over your nose and mouth if you must be around other people or animals, including pets (even at home). Try to stay at least 6 feet away from other people. This will protect the people around you.  Please continue good preventive care measures, including:  frequent hand-washing, avoid touching your face, cover coughs/sneezes, stay out of crowds and keep a 6 foot distance from others.  COVID-19 is a respiratory illness with symptoms that are similar to the flu. Symptoms are typically mild to moderate, but there have been cases of severe illness and death due to the virus.   The following symptoms may appear 2-14 days after exposure: . Fever . Cough . Shortness of breath or difficulty breathing . Chills . Repeated shaking with chills . Muscle pain . Headache . Sore throat . New loss of taste or smell . Fatigue . Congestion or runny nose . Nausea or vomiting . Diarrhea  Go to the nearest hospital ED for assessment if fever/cough/breathlessness are severe or illness seems like a threat to life.  It is vitally important that if you feel that you have an infection such as this virus or any other virus that you stay home and away from places where you may spread it to others.  You should avoid contact with people age 20 and older.   You can use medication such as A prescription cough medication called Tessalon Perles 100 mg. You may take 1-2 capsules every 8 hours as needed for cough and A prescription for Fluticasone nasal spray 2 sprays in each nostril one time per day and dexamethasone 6 mg twice a day for 7 days.   You may also take acetaminophen (Tylenol) as needed for fever.  Reduce your risk of any infection by using the same precautions used for avoiding the  common cold or flu:  Marland Kitchen Wash your hands often with soap and warm water for at least 20 seconds.  If soap and water are not readily available,  use an alcohol-based hand sanitizer with at least 60% alcohol.  . If coughing or sneezing, cover your mouth and nose by coughing or sneezing into the elbow areas of your shirt or coat, into a tissue or into your sleeve (not your hands). . Avoid shaking hands with others and consider head nods or verbal greetings only. . Avoid touching your eyes, nose, or mouth with unwashed hands.  . Avoid close contact with people who are sick. . Avoid places or events with large numbers of people in one location, like concerts or sporting events. . Carefully consider travel plans you have or are making. . If you are planning any travel outside or inside the [place], visit the CDC's Travelers' Health webpage for the latest health notices. . If you have some symptoms but not all symptoms, continue to monitor at home and seek medical attention if your symptoms worsen. . If you are having a medical emergency, call 911.  HOME CARE . Only take medications as instructed by your medical team. . Drink plenty of fluids and get plenty of rest. . A steam or ultrasonic humidifier can help if you have congestion.   GET HELP RIGHT AWAY IF YOU HAVE EMERGENCY WARNING SIGNS** FOR COVID-19. If you or someone is showing any of these signs seek emergency medical care immediately. Call 911 or proceed to your closest emergency facility if: . You develop worsening high fever. . Trouble breathing . Bluish lips or face . Persistent pain or pressure in the chest . New confusion . Inability to wake or stay awake . You cough up blood. . Your symptoms become more severe  **This list is not all possible symptoms. Contact your medical provider for any symptoms that are sever or concerning to you.  MAKE SURE YOU   Understand these instructions.  Will watch your condition.  Will get help right away if you are not doing well or get worse.  Your e-visit answers were reviewed by a board certified advanced clinical practitioner to  complete your personal care plan.  Depending on the condition, your plan could have included both over the counter or prescription medications.  If there is a problem please reply once you have received a response from your provider.  Your safety is important to [place].  If you have drug allergies check your prescription carefully.    You can use MyChart to ask questions about today's visit, request a non-urgent call back, or ask for a work or school excuse for 24 hours related to this e-Visit. If it has been greater than 24 hours you will need to follow up with your provider, or enter a new e-Visit to address those concerns. You will get an e-mail in the next two days asking about your experience.  I hope that your e-visit has been valuable and will speed your recovery. Thank you for using e-visits.  Approximately 5 minutes was spent documenting and reviewing patient's chart.

## 2020-04-15 ENCOUNTER — Other Ambulatory Visit: Payer: Self-pay | Admitting: Family Medicine

## 2020-04-15 DIAGNOSIS — K219 Gastro-esophageal reflux disease without esophagitis: Secondary | ICD-10-CM

## 2020-05-11 ENCOUNTER — Encounter: Payer: Self-pay | Admitting: Physician Assistant

## 2020-05-11 DIAGNOSIS — F419 Anxiety disorder, unspecified: Secondary | ICD-10-CM

## 2020-05-12 ENCOUNTER — Other Ambulatory Visit: Payer: Self-pay | Admitting: Physician Assistant

## 2020-05-12 MED ORDER — LORAZEPAM 0.5 MG PO TABS
0.5000 mg | ORAL_TABLET | Freq: Three times a day (TID) | ORAL | 0 refills | Status: DC | PRN
Start: 2020-05-12 — End: 2020-05-13

## 2020-05-12 MED FILL — LORAZEPAM 0.5 MG TABS: 0.5 | 10 days supply | Qty: 30 | Fill #0

## 2020-05-13 MED ORDER — LORAZEPAM 0.5 MG PO TABS: 0.5000 mg | ORAL_TABLET | Freq: Three times a day (TID) | ORAL | 0 refills | Status: DC | PRN

## 2020-05-13 NOTE — Addendum Note (Signed)
Addended by: Jomarie Longs on: 05/13/2020 08:12 AM   Modules accepted: Orders

## 2020-12-23 ENCOUNTER — Ambulatory Visit (INDEPENDENT_AMBULATORY_CARE_PROVIDER_SITE_OTHER): Payer: 59 | Admitting: Physician Assistant

## 2020-12-23 ENCOUNTER — Other Ambulatory Visit: Payer: Self-pay

## 2020-12-23 ENCOUNTER — Encounter: Payer: Self-pay | Admitting: Physician Assistant

## 2020-12-23 VITALS — BP 110/74 | HR 70 | Ht 59.0 in | Wt 152.0 lb

## 2020-12-23 DIAGNOSIS — Z79899 Other long term (current) drug therapy: Secondary | ICD-10-CM

## 2020-12-23 DIAGNOSIS — E559 Vitamin D deficiency, unspecified: Secondary | ICD-10-CM

## 2020-12-23 DIAGNOSIS — F419 Anxiety disorder, unspecified: Secondary | ICD-10-CM

## 2020-12-23 DIAGNOSIS — K219 Gastro-esophageal reflux disease without esophagitis: Secondary | ICD-10-CM

## 2020-12-23 DIAGNOSIS — R519 Headache, unspecified: Secondary | ICD-10-CM | POA: Diagnosis not present

## 2020-12-23 DIAGNOSIS — F458 Other somatoform disorders: Secondary | ICD-10-CM

## 2020-12-23 DIAGNOSIS — Z1329 Encounter for screening for other suspected endocrine disorder: Secondary | ICD-10-CM

## 2020-12-23 DIAGNOSIS — Z131 Encounter for screening for diabetes mellitus: Secondary | ICD-10-CM

## 2020-12-23 DIAGNOSIS — Z13220 Encounter for screening for lipoid disorders: Secondary | ICD-10-CM

## 2020-12-23 DIAGNOSIS — G479 Sleep disorder, unspecified: Secondary | ICD-10-CM

## 2020-12-23 MED ORDER — LORAZEPAM 0.5 MG PO TABS: 0.5000 mg | ORAL_TABLET | Freq: Three times a day (TID) | ORAL | 2 refills | Status: DC | PRN

## 2020-12-23 MED ORDER — PANTOPRAZOLE SODIUM 40 MG PO TBEC
40.0000 mg | DELAYED_RELEASE_TABLET | Freq: Every day | ORAL | 3 refills | Status: DC
Start: 2020-12-23 — End: 2022-03-14

## 2020-12-23 MED ORDER — BACLOFEN 10 MG PO TABS: 10.0000 mg | ORAL_TABLET | Freq: Every day | ORAL | 3 refills | Status: DC

## 2020-12-23 NOTE — Progress Notes (Signed)
Subjective:    Patient ID: [name] [name], female    DOB: 01-07-1981, 40 y.o.   MRN: [id number]  HPI Pt is a 40 yo female with teeth grinding, GERD, anxiety who presents to the clinic for medication refills.   Pt just got a new job. She is hoping this helps with anxiety and stress. Using ativan about once a week.   Reflux is controlled with medication. No concerns.   Pt continues to have teeth grinding. She is out of baclofen. She would like to restart. Also having problems going to sleep. Thinking the baclofen was helping.   .. Active Ambulatory Problems    Diagnosis Date Noted   Migraines 03/24/2011   Anemia 01/31/2012   Malpositioned IUD 04/01/2012   History of abnormal Pap smear 08/13/2011   History of depression 08/13/2011   Obesity (BMI 30.0-34.9) 01/12/2013   Stress due to illness of family member 11/29/2013   Acne vulgaris 01/11/2014   Binge eating disorder 04/21/2014   Gastroesophageal reflux disease 04/21/2014   GAD (generalized anxiety disorder) 06/22/2014   PTSD (post-traumatic stress disorder) 06/22/2014   Pregnancy 10/18/2014   Normal labor 04/21/2015   Normal vaginal delivery 04/22/2015   Class 1 obesity due to excess calories without serious comorbidity with body mass index (BMI) of 34.0 to 34.9 in adult 11/22/2015   Anxiety 12/14/2016   Chronic upper back pain 12/16/2016   Other headache syndrome 09/16/2018   Weight loss 09/16/2018   Hair loss 09/16/2018   Encounter for counseling 01/21/2019   Teeth grinding 03/24/2019   Frequent headaches 03/24/2019   Left breast mass 03/24/2019   Breast pain, left 03/25/2019   Low energy 10/09/2019   Vitamin D deficiency 10/09/2019   Trouble in sleeping 12/23/2020   Resolved Ambulatory Problems    Diagnosis Date Noted   Edema in pregnancy 01/14/2012   Mild hypertension 01/14/2012   Status post primary low transverse cesarean section 01/30/2012   Acute appendicitis s/p lap appy 04/01/2012 04/01/2012    Postsurgical fever 04/07/2012   Acute upper respiratory infection 04/26/2014   Left ankle sprain 07/01/2014   Past Medical History:  Diagnosis Date   Breast hypertrophy 01/2016   GERD (gastroesophageal reflux disease)    Runny nose 01/26/2016     Review of Systems  All other systems reviewed and are negative.     Objective:   Physical Exam Vitals reviewed.  Constitutional:      Appearance: Normal appearance.  HENT:     Head: Normocephalic.  Cardiovascular:     Rate and Rhythm: Normal rate and regular rhythm.     Pulses: Normal pulses.  Pulmonary:     Effort: Pulmonary effort is normal.  Neurological:     General: No focal deficit present.     Mental Status: She is alert and oriented to person, place, and time.  Psychiatric:        Mood and Affect: Mood normal.   .. Depression screen [facility] 2/9 12/23/2020 10/06/2019 09/16/2018 04/08/2018 08/13/2017  Decreased Interest 1 1 2  0 0  Down, Depressed, Hopeless 0 1 1 0 0  PHQ - 2 Score 1 2 3  0 0  Altered sleeping 3 1 1 1  -  Tired, decreased energy 2 1 2 1  -  Change in appetite 1 1 0 0 -  Feeling bad or failure about yourself  0 0 0 0 -  Trouble concentrating 2 2 3 2  -  Moving slowly or fidgety/restless 0 1 0 0 -  Suicidal thoughts 0 0 0 0 -  PHQ-9 Score 9 8 9 4  -  Difficult doing work/chores Somewhat difficult Somewhat difficult Somewhat difficult Somewhat difficult -  Some encounter information is confidential and restricted. Go to Review Flowsheets activity to see all data.   .. GAD 7 : Generalized Anxiety Score 12/23/2020 10/06/2019 09/16/2018 04/08/2018  Nervous, Anxious, on Edge 3 2 2 2   Control/stop worrying 3 1 1 2   Worry too much - different things 3 1 1 1   Trouble relaxing 2 1 1 1   Restless 2 1 0 1  Easily annoyed or irritable 2 1 3 2   Afraid - awful might happen 2 1 2 2   Total GAD 7 Score 17 8 10 11   Anxiety Difficulty Somewhat difficult Somewhat difficult Very difficult Very difficult            Assessment  & Plan:  04/10/2018 Carley was seen today for insomnia.  Diagnoses and all orders for this visit:  Anxiety -     LORazepam (ATIVAN) 0.5 MG tablet; Take 1 tablet (0.5 mg total) by mouth every 8 (eight) hours as needed for anxiety.  Gastroesophageal reflux disease -     pantoprazole (PROTONIX) 40 MG tablet; Take 1 tablet (40 mg total) by mouth daily.  Teeth grinding -     baclofen (LIORESAL) 10 MG tablet; Take 1 tablet (10 mg total) by mouth at bedtime.  Frequent headaches -     baclofen (LIORESAL) 10 MG tablet; Take 1 tablet (10 mg total) by mouth at bedtime.  Trouble in sleeping  PHQ/GAD not to goal.  Taking ativan once a week or so.  Declines daily medication.  Hoping new job helps.   Restart baclofen for teeth grinding and perhaps will help with sleep. If not helping let me know and will try trazodone. Discussed good sleep routine.   GERD controlled. Sent refills.   Needs CPE will follow up when new insurance kicks in.

## 2021-02-13 ENCOUNTER — Telehealth: Payer: Commercial Managed Care - PPO | Admitting: Family Medicine

## 2021-02-13 DIAGNOSIS — J019 Acute sinusitis, unspecified: Secondary | ICD-10-CM

## 2021-02-13 DIAGNOSIS — B9689 Other specified bacterial agents as the cause of diseases classified elsewhere: Secondary | ICD-10-CM

## 2021-02-13 MED ORDER — AMOXICILLIN-POT CLAVULANATE 875-125 MG PO TABS
1.0000 | ORAL_TABLET | Freq: Two times a day (BID) | ORAL | 0 refills | Status: AC
Start: 2021-02-13 — End: 2021-02-20

## 2021-02-13 NOTE — Progress Notes (Signed)
Virtual Visit Consent   [name] [name], you are scheduled for a virtual visit with a [facility] provider today.     Just as with appointments in the office, your consent must be obtained to participate.  Your consent will be active for this visit and any virtual visit you may have with one of our providers in the next 365 days.     If you have a MyChart account, a copy of this consent can be sent to you electronically.  All virtual visits are billed to your insurance company just like a traditional visit in the office.    As this is a virtual visit, video technology does not allow for your provider to perform a traditional examination.  This may limit your provider's ability to fully assess your condition.  If your provider identifies any concerns that need to be evaluated in person or the need to arrange testing (such as labs, EKG, etc.), we will make arrangements to do so.     Although advances in technology are sophisticated, we cannot ensure that it will always work on either your end or our end.  If the connection with a video visit is poor, the visit may have to be switched to a telephone visit.  With either a video or telephone visit, we are not always able to ensure that we have a secure connection.     I need to obtain your verbal consent now.   Are you willing to proceed with your visit today?    [name] [name] has provided verbal consent on 02/13/2021 for a virtual visit (video or telephone).   Freddy Finner, NP   Date: 02/13/2021 8:39 AM   Virtual Visit via Video Note   I, Freddy Finner, connected with  [name] [name]  ([id number], Aug 26, 1980) on 02/13/21 at  8:45 AM EST by a video-enabled telemedicine application and verified that I am speaking with the correct person using two identifiers.  Location: Patient: Virtual Visit Location Patient: Home Provider: Virtual Visit Location Provider: Home Office   I discussed the limitations of evaluation and management by  telemedicine and the availability of in person appointments. The patient expressed understanding and agreed to proceed.    History of Present Illness: [name] [name] is a 40 y.o. who identifies as a female who was assigned female at birth, and is being seen today for sinus symptoms. Reports that congestion started last Wednesday 12/21 and now sore throat started yesterday and voice loss. Reports congestion has started to worsen- drainage is thick green and yellow.  As tried day and ny quil and ibuprofen - mild relief with pressure in head.   Denies known sick contacts.  Denies fevers, chills, chest pain, and shortness of breath, ear pain.   COVID test: has not tested yet Vaccines for Flu and Covid: not vaccines  Problems:  Patient Active Problem List   Diagnosis Date Noted   Trouble in sleeping 12/23/2020   Low energy 10/09/2019   Vitamin D deficiency 10/09/2019   Breast pain, left 03/25/2019   Teeth grinding 03/24/2019   Frequent headaches 03/24/2019   Left breast mass 03/24/2019   Encounter for counseling 01/21/2019   Other headache syndrome 09/16/2018   Weight loss 09/16/2018   Hair loss 09/16/2018   Chronic upper back pain 12/16/2016   Anxiety 12/14/2016   Class 1 obesity due to excess calories without serious comorbidity with body mass index (BMI) of 34.0 to 34.9 in adult 11/22/2015  Normal vaginal delivery 04/22/2015   Normal labor 04/21/2015   Pregnancy 10/18/2014   GAD (generalized anxiety disorder) 06/22/2014   PTSD (post-traumatic stress disorder) 06/22/2014   Binge eating disorder 04/21/2014   Gastroesophageal reflux disease 04/21/2014   Acne vulgaris 01/11/2014   Stress due to illness of family member 11/29/2013   Obesity (BMI 30.0-34.9) 01/12/2013   Malpositioned IUD 04/01/2012   Anemia 01/31/2012   History of abnormal Pap smear 08/13/2011   History of depression 08/13/2011   Migraines 03/24/2011    Allergies:  Allergies  Allergen Reactions    Wellbutrin [Bupropion] Other (See Comments)    CAUSES ANGER   Adhesive [Tape]     SKIN IRRITATION   Medications:  Current Outpatient Medications:    baclofen (LIORESAL) 10 MG tablet, Take 1 tablet (10 mg total) by mouth at bedtime., Disp: 90 each, Rfl: 3   fluticasone (FLONASE) 50 MCG/ACT nasal spray, Place 2 sprays into both nostrils daily., Disp: 16 g, Rfl: 6   levonorgestrel (MIRENA, 52 MG,) 20 MCG/24HR IUD, Mirena 20 mcg/24 hours (6 yrs) 52 mg intrauterine device  Take 1 device by intrauterine route., Disp: , Rfl:    LORazepam (ATIVAN) 0.5 MG tablet, Take 1 tablet (0.5 mg total) by mouth every 8 (eight) hours as needed for anxiety., Disp: 30 tablet, Rfl: 2   Multiple Vitamin (MULTIVITAMIN) tablet, Take 1 tablet by mouth daily., Disp: , Rfl:    pantoprazole (PROTONIX) 40 MG tablet, Take 1 tablet (40 mg total) by mouth daily., Disp: 90 tablet, Rfl: 3   tretinoin (RETIN-A) 0.1 % cream, Apply topically at bedtime. For acne., Disp: 45 g, Rfl: 1  Observations/Objective: Patient is well-developed, well-nourished in no acute distress.  Resting comfortably  at home.  Head is normocephalic, atraumatic.  No labored breathing.  Speech is clear and coherent with logical content.  Patient is alert and oriented at baseline.    Assessment and Plan: 1. Acute bacterial sinusitis Day 6 of sinus symptoms with progression over the last 24 hours  Will go ahead and treat as sinus infection given this   Reviewed side effects, risks and benefits of medication.    Patient acknowledged agreement and understanding of the plan.   I discussed the assessment and treatment plan with the patient. The patient was provided an opportunity to ask questions and all were answered. The patient agreed with the plan and demonstrated an understanding of the instructions.   The patient was advised to call back or seek an in-person evaluation if the symptoms worsen or if the condition fails to improve as anticipated.    The above assessment and management plan was discussed with the patient. The patient verbalized understanding of and has agreed to the management plan. Patient is aware to call the clinic if symptoms persist or worsen. Patient is aware when to return to the clinic for a follow-up visit. Patient educated on when it is appropriate to go to the emergency department.   - amoxicillin-clavulanate (AUGMENTIN) 875-125 MG tablet; Take 1 tablet by mouth 2 (two) times daily for 7 days.  Dispense: 14 tablet; Refill: 0    Follow Up Instructions: I discussed the assessment and treatment plan with the patient. The patient was provided an opportunity to ask questions and all were answered. The patient agreed with the plan and demonstrated an understanding of the instructions.  A copy of instructions were sent to the patient via MyChart unless otherwise noted below.     The patient was advised to  call back or seek an in-person evaluation if the symptoms worsen or if the condition fails to improve as anticipated.  Time:  I spent 11 minutes with the patient via telehealth technology discussing the above problems/concerns.    Freddy Finner, NP

## 2021-02-13 NOTE — Patient Instructions (Signed)
I appreciate the opportunity to provide you with care for your health and wellness.  Take medication in full  Hope you feel better soon!  Please continue to practice social distancing to keep you, your family, and our community safe.  If you must go out, please wear a mask and practice good handwashing.  Have a wonderful day. With Gratitude, Tereasa Coop, DNP, AGNP-BC

## 2021-04-14 ENCOUNTER — Telehealth: Payer: Self-pay | Admitting: Neurology

## 2021-04-14 DIAGNOSIS — Z0000 Encounter for general adult medical examination without abnormal findings: Secondary | ICD-10-CM

## 2021-04-14 DIAGNOSIS — E559 Vitamin D deficiency, unspecified: Secondary | ICD-10-CM

## 2021-04-14 DIAGNOSIS — Z131 Encounter for screening for diabetes mellitus: Secondary | ICD-10-CM

## 2021-04-14 DIAGNOSIS — Z1329 Encounter for screening for other suspected endocrine disorder: Secondary | ICD-10-CM

## 2021-04-14 DIAGNOSIS — Z13220 Encounter for screening for lipoid disorders: Secondary | ICD-10-CM

## 2021-04-14 NOTE — Telephone Encounter (Signed)
Patient requested to have labs done prior to her visit. Labs ordered. Patient made aware.

## 2021-04-17 ENCOUNTER — Encounter: Payer: Self-pay | Admitting: Physician Assistant

## 2021-04-17 ENCOUNTER — Other Ambulatory Visit: Payer: Self-pay

## 2021-04-17 ENCOUNTER — Ambulatory Visit (INDEPENDENT_AMBULATORY_CARE_PROVIDER_SITE_OTHER): Payer: Commercial Managed Care - PPO | Admitting: Physician Assistant

## 2021-04-17 VITALS — BP 109/76 | HR 72 | Ht 59.0 in | Wt 156.0 lb

## 2021-04-17 DIAGNOSIS — Z6831 Body mass index (BMI) 31.0-31.9, adult: Secondary | ICD-10-CM | POA: Diagnosis not present

## 2021-04-17 DIAGNOSIS — Z0000 Encounter for general adult medical examination without abnormal findings: Secondary | ICD-10-CM | POA: Diagnosis not present

## 2021-04-17 DIAGNOSIS — E6609 Other obesity due to excess calories: Secondary | ICD-10-CM

## 2021-04-17 NOTE — Patient Instructions (Signed)
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
## 2021-04-18 LAB — COMPLETE METABOLIC PANEL WITH GFR
AG Ratio: 1.6 (calc) (ref 1.0–2.5)
ALT: 11 U/L (ref 6–29)
AST: 16 U/L (ref 10–30)
Albumin: 4.1 g/dL (ref 3.6–5.1)
Alkaline phosphatase (APISO): 64 U/L (ref 31–125)
BUN: 14 mg/dL (ref 7–25)
CO2: 26 mmol/L (ref 20–32)
Calcium: 9.4 mg/dL (ref 8.6–10.2)
Chloride: 102 mmol/L (ref 98–110)
Creat: 0.64 mg/dL (ref 0.50–0.97)
Globulin: 2.6 g/dL (calc) (ref 1.9–3.7)
Glucose, Bld: 86 mg/dL (ref 65–99)
Potassium: 4.1 mmol/L (ref 3.5–5.3)
Sodium: 138 mmol/L (ref 135–146)
Total Bilirubin: 0.8 mg/dL (ref 0.2–1.2)
Total Protein: 6.7 g/dL (ref 6.1–8.1)
eGFR: 116 mL/min/{1.73_m2} (ref >=60)

## 2021-04-18 LAB — LIPID PANEL W/REFLEX DIRECT LDL
Cholesterol: 196 mg/dL (ref <200)
HDL: 57 mg/dL (ref >=50)
LDL Cholesterol (Calc): 122 mg/dL (calc) — ABNORMAL HIGH
Non-HDL Cholesterol (Calc): 139 mg/dL (calc) — ABNORMAL HIGH (ref <130)
Total CHOL/HDL Ratio: 3.4 (calc) (ref <5.0)
Triglycerides: 73 mg/dL (ref <150)

## 2021-04-18 LAB — CBC WITH DIFFERENTIAL/PLATELET
Absolute Monocytes: 405 cells/uL (ref 200–950)
Basophils Absolute: 21 cells/uL (ref 0–200)
Basophils Relative: 0.3 %
Eosinophils Absolute: 50 cells/uL (ref 15–500)
Eosinophils Relative: 0.7 %
HCT: 42.6 % (ref 35.0–45.0)
Hemoglobin: 14.4 g/dL (ref 11.7–15.5)
Lymphs Abs: 1548 cells/uL (ref 850–3900)
MCH: 31.4 pg (ref 27.0–33.0)
MCHC: 33.8 g/dL (ref 32.0–36.0)
MCV: 93 fL (ref 80.0–100.0)
MPV: 10 fL (ref 7.5–12.5)
Monocytes Relative: 5.7 %
Neutro Abs: 5077 cells/uL (ref 1500–7800)
Neutrophils Relative %: 71.5 %
Platelets: 192 10*3/uL (ref 140–400)
RBC: 4.58 10*6/uL (ref 3.80–5.10)
RDW: 11.6 % (ref 11.0–15.0)
Total Lymphocyte: 21.8 %
WBC: 7.1 10*3/uL (ref 3.8–10.8)

## 2021-04-18 LAB — TSH: TSH: 2 mIU/L

## 2021-04-18 LAB — VITAMIN D 25 HYDROXY (VIT D DEFICIENCY, FRACTURES): Vit D, 25-Hydroxy: 41 ng/mL (ref 30–100)

## 2021-04-18 NOTE — Progress Notes (Signed)
Thyroid look good.  Kidney, liver, sugars look good.  Normal hemoglobin.  Vitamin D down from 1 year ago but in normal range. Make sure taking at least 1000 units daily.  TG look good.  LDL not optimal level and up from 1 year ago. Recheck in 1 year. Watch fried, process food and exercise 150 minutes a week.

## 2021-08-25 ENCOUNTER — Other Ambulatory Visit: Payer: Self-pay | Admitting: Physician Assistant

## 2021-08-25 NOTE — Telephone Encounter (Signed)
Last written 12/23/2020 #30 with 2 refills  Last appt 04/17/2021

## 2021-12-24 ENCOUNTER — Encounter: Payer: Self-pay | Admitting: Physician Assistant

## 2021-12-25 ENCOUNTER — Other Ambulatory Visit (HOSPITAL_BASED_OUTPATIENT_CLINIC_OR_DEPARTMENT_OTHER): Payer: Self-pay

## 2021-12-25 MED ORDER — SCOPOLAMINE 1 MG/3DAYS TD PT72: 1.0000 | MEDICATED_PATCH | TRANSDERMAL | 0 refills | Status: DC

## 2021-12-25 MED ORDER — SCOPOLAMINE 1 MG/3DAYS TD PT72
1.0000 | MEDICATED_PATCH | TRANSDERMAL | 0 refills | Status: DC
  Filled 2021-12-25: qty 10, 30d supply, fill #0

## 2022-01-23 ENCOUNTER — Telehealth: Payer: Self-pay | Admitting: Physician Assistant

## 2022-01-23 DIAGNOSIS — B9689 Other specified bacterial agents as the cause of diseases classified elsewhere: Secondary | ICD-10-CM

## 2022-01-23 DIAGNOSIS — J208 Acute bronchitis due to other specified organisms: Secondary | ICD-10-CM

## 2022-01-23 MED ORDER — BENZONATATE 100 MG PO CAPS: 100.0000 mg | ORAL_CAPSULE | Freq: Three times a day (TID) | ORAL | 0 refills | Status: DC | PRN

## 2022-01-23 MED ORDER — AZITHROMYCIN 250 MG PO TABS: ORAL_TABLET | ORAL | 0 refills | Status: DC

## 2022-01-23 NOTE — Progress Notes (Signed)
We are sorry that you are not feeling well.  Here is how we plan to help!  Based on your presentation I believe you most likely have A cough due to bacteria.  When patients have a productive cough with a change in color or increased sputum production, we are concerned about bacterial bronchitis.  If left untreated it can progress to pneumonia.  If your symptoms do not improve with your treatment plan it is important that you contact your provider.   I have prescribed Azithromyin 250 mg: two tablets now and then one tablet daily for 4 additonal days    In addition you may use A non-prescription cough medication called Mucinex DM: take 2 tablets every 12 hours. and A prescription cough medication called Tessalon Perles 100mg . You may take 1-2 capsules every 8 hours as needed for your cough.   From your responses in the eVisit questionnaire you describe inflammation in the upper respiratory tract which is causing a significant cough.  This is commonly called Bronchitis and has four common causes:   Allergies Viral Infections Acid Reflux Bacterial Infection Allergies, viruses and acid reflux are treated by controlling symptoms or eliminating the cause. An example might be a cough caused by taking certain blood pressure medications. You stop the cough by changing the medication. Another example might be a cough caused by acid reflux. Controlling the reflux helps control the cough.  USE OF BRONCHODILATOR ("RESCUE") INHALERS: There is a risk from using your bronchodilator too frequently.  The risk is that over-reliance on a medication which only relaxes the muscles surrounding the breathing tubes can reduce the effectiveness of medications prescribed to reduce swelling and congestion of the tubes themselves.  Although you feel brief relief from the bronchodilator inhaler, your asthma may actually be worsening with the tubes becoming more swollen and filled with mucus.  This can delay other crucial  treatments, such as oral steroid medications. If you need to use a bronchodilator inhaler daily, several times per day, you should discuss this with your provider.  There are probably better treatments that could be used to keep your asthma under control.     HOME CARE Only take medications as instructed by your medical team. Complete the entire course of an antibiotic. Drink plenty of fluids and get plenty of rest. Avoid close contacts especially the very young and the elderly Cover your mouth if you cough or cough into your sleeve. Always remember to wash your hands A steam or ultrasonic humidifier can help congestion.   GET HELP RIGHT AWAY IF: You develop worsening fever. You become short of breath You cough up blood. Your symptoms persist after you have completed your treatment plan MAKE SURE YOU  Understand these instructions. Will watch your condition. Will get help right away if you are not doing well or get worse.    Thank you for choosing an e-visit.  Your e-visit answers were reviewed by a board certified advanced clinical practitioner to complete your personal care plan. Depending upon the condition, your plan could have included both over the counter or prescription medications.  Please review your pharmacy choice. Make sure the pharmacy is open so you can pick up prescription now. If there is a problem, you may contact your provider through and have the prescription routed to another pharmacy.  Your safety is important to [employer]. If you have drug allergies check your prescription carefully.   For the next 24 hours you can use MyChart to ask  questions about today's visit, request a non-urgent call back, or ask for a work or school excuse. You will get an email in the next two days asking about your experience. I hope that your e-visit has been valuable and will speed your recovery.

## 2022-01-23 NOTE — Progress Notes (Signed)
I have spent 5 minutes in review of e-visit questionnaire, review and updating patient chart, medical decision making and response to patient.   Bellarae Lizer Cody Beryle Bagsby, PA-C    

## 2022-01-24 MED ORDER — AMOXICILLIN-POT CLAVULANATE 875-125 MG PO TABS: 1.0000 | ORAL_TABLET | Freq: Two times a day (BID) | ORAL | 0 refills | Status: DC

## 2022-01-24 NOTE — Addendum Note (Signed)
Addended by: Waldon Merl on: 01/24/2022 08:03 AM   Modules accepted: Orders

## 2022-01-24 NOTE — Addendum Note (Signed)
Addended by: Marcelline Mates C on: 01/24/2022 08:00 AM   Modules accepted: Orders

## 2022-03-14 ENCOUNTER — Other Ambulatory Visit: Payer: Self-pay | Admitting: Physician Assistant

## 2022-03-14 DIAGNOSIS — K219 Gastro-esophageal reflux disease without esophagitis: Secondary | ICD-10-CM

## 2022-03-15 ENCOUNTER — Other Ambulatory Visit: Payer: Self-pay | Admitting: Physician Assistant

## 2022-03-15 DIAGNOSIS — K219 Gastro-esophageal reflux disease without esophagitis: Secondary | ICD-10-CM

## 2022-05-17 ENCOUNTER — Other Ambulatory Visit: Payer: Self-pay | Admitting: Physician Assistant

## 2022-05-17 DIAGNOSIS — K219 Gastro-esophageal reflux disease without esophagitis: Secondary | ICD-10-CM

## 2022-11-17 ENCOUNTER — Encounter: Payer: Self-pay | Admitting: Physician Assistant

## 2022-11-21 NOTE — Telephone Encounter (Signed)
See if this works

## 2023-02-05 ENCOUNTER — Ambulatory Visit: Payer: Self-pay | Admitting: Physician Assistant

## 2023-02-05 ENCOUNTER — Encounter: Payer: Self-pay | Admitting: Physician Assistant

## 2023-02-05 ENCOUNTER — Other Ambulatory Visit (HOSPITAL_BASED_OUTPATIENT_CLINIC_OR_DEPARTMENT_OTHER): Payer: Self-pay

## 2023-02-05 VITALS — BP 110/73 | HR 83 | Temp 98.6°F | Resp 12 | Ht 59.0 in | Wt 156.9 lb

## 2023-02-05 DIAGNOSIS — J029 Acute pharyngitis, unspecified: Secondary | ICD-10-CM

## 2023-02-05 DIAGNOSIS — Z20818 Contact with and (suspected) exposure to other bacterial communicable diseases: Secondary | ICD-10-CM

## 2023-02-05 LAB — POCT RAPID STREP A (OFFICE): Rapid Strep A Screen: NEGATIVE

## 2023-02-05 MED ORDER — PREDNISONE 20 MG PO TABS
20.0000 mg | ORAL_TABLET | Freq: Every day | ORAL | 0 refills | Status: DC
  Filled 2023-02-05: qty 5, 5d supply, fill #0

## 2023-02-05 NOTE — Progress Notes (Signed)
Acute Office Visit  Subjective:     Patient ID: [name] [name], female    DOB: Apr 14, 1980, 42 y.o.   MRN: [id number]  Chief Complaint  Patient presents with   Sore Throat    HPI Patient is in today for sore throat since Sunday. She does have strep throat close exposure. She denies any fever, chills, body aches. Her neck "feels swollen". Her throat started off as "itchy" and now hurts more. She has loss of appetite. She has used OTC medications with minimal relief. Denies any cough, sinus pressure, ear pain, abdominal pain.   .. Active Ambulatory Problems    Diagnosis Date Noted   Migraines 03/24/2011   Anemia 01/31/2012   Malpositioned IUD 04/01/2012   History of abnormal Pap smear 08/13/2011   History of depression 08/13/2011   Obesity (BMI 30.0-34.9) 01/12/2013   Stress due to illness of family member 11/29/2013   Acne vulgaris 01/11/2014   Binge eating disorder 04/21/2014   Gastroesophageal reflux disease 04/21/2014   GAD (generalized anxiety disorder) 06/22/2014   PTSD (post-traumatic stress disorder) 06/22/2014   Pregnancy 10/18/2014   Normal labor 04/21/2015   Normal vaginal delivery 04/22/2015   Class 1 obesity due to excess calories without serious comorbidity with body mass index (BMI) of 34.0 to 34.9 in adult 11/22/2015   Anxiety 12/14/2016   Chronic upper back pain 12/16/2016   Other headache syndrome 09/16/2018   Weight loss 09/16/2018   Hair loss 09/16/2018   Encounter for counseling 01/21/2019   Teeth grinding 03/24/2019   Frequent headaches 03/24/2019   Left breast mass 03/24/2019   Breast pain, left 03/25/2019   Low energy 10/09/2019   Vitamin D deficiency 10/09/2019   Trouble in sleeping 12/23/2020   Resolved Ambulatory Problems    Diagnosis Date Noted   Edema in pregnancy 01/14/2012   Mild hypertension 01/14/2012   Status post primary low transverse cesarean section 01/30/2012   Acute appendicitis s/p lap appy 04/01/2012 04/01/2012    Postsurgical fever 04/07/2012   Acute upper respiratory infection 04/26/2014   Left ankle sprain 07/01/2014   Past Medical History:  Diagnosis Date   Breast hypertrophy 01/2016   GERD (gastroesophageal reflux disease)    Runny nose 01/26/2016     ROS See HPI.      Objective:    BP 110/73 (BP Location: Right Arm, Patient Position: Sitting)   Pulse 83   Temp 98.6 F (37 C)   Resp 12   Ht 4\' 11"  (1.499 m)   Wt 156 lb 14.4 oz (71.2 kg)   SpO2 98%   BMI 31.69 kg/m  BP Readings from Last 3 Encounters:  02/05/23 110/73  04/17/21 109/76  12/23/20 110/74   Wt Readings from Last 3 Encounters:  02/05/23 156 lb 14.4 oz (71.2 kg)  04/17/21 156 lb (70.8 kg)  12/23/20 152 lb (68.9 kg)    .Marland Kitchen Results for orders placed or performed in visit on 02/05/23  POCT rapid strep A   Collection Time: 02/05/23 12:12 PM  Result Value Ref Range   Rapid Strep A Screen Negative Negative     Physical Exam Constitutional:      Appearance: She is well-developed.  HENT:     Head: Normocephalic.     Right Ear: Tympanic membrane and ear canal normal. No drainage, swelling or tenderness. No middle ear effusion. Tympanic membrane is not erythematous.     Left Ear: Tympanic membrane and ear canal normal. No drainage, swelling or tenderness.  No  middle ear effusion. Tympanic membrane is not erythematous.     Mouth/Throat:     Mouth: Mucous membranes are moist. No oral lesions.     Pharynx: Uvula midline. Posterior oropharyngeal erythema and uvula swelling present. No pharyngeal swelling or oropharyngeal exudate.     Tonsils: No tonsillar exudate or tonsillar abscesses.  Eyes:     Conjunctiva/sclera: Conjunctivae normal.  Cardiovascular:     Rate and Rhythm: Normal rate and regular rhythm.  Pulmonary:     Effort: Pulmonary effort is normal.     Breath sounds: Normal breath sounds.  Neurological:     Mental Status: She is alert.  Psychiatric:        Mood and Affect: Mood normal.           Assessment & Plan:  Marland KitchenMarland KitchenYesica was seen today for sore throat.  Diagnoses and all orders for this visit:  Viral pharyngitis -     predniSONE (DELTASONE) 20 MG tablet; Take 1 tablet (20 mg total) by mouth daily for 5 days.  Sore throat -     POCT rapid strep A  Streptococcus exposure -     POCT rapid strep A    Rapid strep negative Likely viral Prednisone for 5 days Continue symptomatic care Follow up if not improving or symptoms worsening   Tandy Gaw, PA-C

## 2023-02-05 NOTE — Patient Instructions (Signed)

## 2023-02-07 ENCOUNTER — Encounter: Payer: Self-pay | Admitting: Physician Assistant

## 2023-02-09 MED ORDER — AMOXICILLIN 875 MG PO TABS
875.0000 mg | ORAL_TABLET | Freq: Two times a day (BID) | ORAL | 0 refills | Status: DC
  Filled 2023-02-09: qty 14, 7d supply, fill #0

## 2023-02-11 ENCOUNTER — Other Ambulatory Visit (HOSPITAL_BASED_OUTPATIENT_CLINIC_OR_DEPARTMENT_OTHER): Payer: Self-pay

## 2023-02-14 ENCOUNTER — Other Ambulatory Visit (HOSPITAL_BASED_OUTPATIENT_CLINIC_OR_DEPARTMENT_OTHER): Payer: Self-pay

## 2023-09-03 ENCOUNTER — Encounter: Payer: Self-pay | Admitting: Physician Assistant

## 2023-09-03 ENCOUNTER — Other Ambulatory Visit (HOSPITAL_BASED_OUTPATIENT_CLINIC_OR_DEPARTMENT_OTHER): Payer: Self-pay

## 2023-09-03 ENCOUNTER — Other Ambulatory Visit: Payer: Self-pay

## 2023-09-03 ENCOUNTER — Ambulatory Visit: Payer: Self-pay | Admitting: Physician Assistant

## 2023-09-03 VITALS — BP 124/78 | HR 74 | Ht 59.0 in | Wt 155.0 lb

## 2023-09-03 DIAGNOSIS — L720 Epidermal cyst: Secondary | ICD-10-CM

## 2023-09-03 DIAGNOSIS — R519 Headache, unspecified: Secondary | ICD-10-CM

## 2023-09-03 DIAGNOSIS — L814 Other melanin hyperpigmentation: Secondary | ICD-10-CM

## 2023-09-03 DIAGNOSIS — L700 Acne vulgaris: Secondary | ICD-10-CM

## 2023-09-03 DIAGNOSIS — B078 Other viral warts: Secondary | ICD-10-CM

## 2023-09-03 DIAGNOSIS — F458 Other somatoform disorders: Secondary | ICD-10-CM

## 2023-09-03 DIAGNOSIS — F419 Anxiety disorder, unspecified: Secondary | ICD-10-CM

## 2023-09-03 MED ORDER — TRETINOIN 0.1 % EX CREA
1.0000 | TOPICAL_CREAM | Freq: Every day | CUTANEOUS | 1 refills | Status: AC
  Filled 2023-09-03: qty 45, 30d supply, fill #0

## 2023-09-03 MED ORDER — LORAZEPAM 1 MG PO TABS: 0.5000 mg | ORAL_TABLET | Freq: Three times a day (TID) | ORAL | 1 refills | Status: AC | PRN

## 2023-09-03 MED ORDER — BACLOFEN 10 MG PO TABS
10.0000 mg | ORAL_TABLET | Freq: Every day | ORAL | 1 refills | Status: AC
  Filled 2023-09-03: qty 90, 90d supply, fill #0

## 2023-09-03 MED ORDER — LORAZEPAM 1 MG PO TABS
0.5000 mg | ORAL_TABLET | Freq: Three times a day (TID) | ORAL | 1 refills | Status: DC | PRN
  Filled 2023-09-03: qty 30, 10d supply, fill #0

## 2023-09-03 NOTE — Progress Notes (Unsigned)
   Established Patient Office Visit  Subjective   Patient ID: [name] [name], female    DOB: [birth year]  Age: 43 y.o. MRN: [id number]  No chief complaint on file.   HPI  {History (Optional):23778}  ROS    Objective:     There were no vitals taken for this visit. {Vitals History (Optional):23777}  Physical Exam   No results found for any visits on 09/03/23.  {Labs (Optional):23779}  The 10-year ASCVD risk score (Arnett DK, et al., 2019) is: 0.4%    Assessment & Plan:   Problem List Items Addressed This Visit   None   No follow-ups on file.    Tyera Hansley, PA-C

## 2023-09-03 NOTE — Telephone Encounter (Signed)
 Pended ativan  to [facility]

## 2023-09-06 ENCOUNTER — Encounter: Payer: Self-pay | Admitting: Physician Assistant

## 2024-02-20 ENCOUNTER — Telehealth: Payer: Self-pay | Admitting: Physician Assistant

## 2024-02-20 DIAGNOSIS — H6991 Unspecified Eustachian tube disorder, right ear: Secondary | ICD-10-CM

## 2024-02-20 DIAGNOSIS — B9689 Other specified bacterial agents as the cause of diseases classified elsewhere: Secondary | ICD-10-CM

## 2024-02-20 DIAGNOSIS — J208 Acute bronchitis due to other specified organisms: Secondary | ICD-10-CM

## 2024-02-20 MED ORDER — AMOXICILLIN-POT CLAVULANATE 875-125 MG PO TABS: 1.0000 | ORAL_TABLET | Freq: Two times a day (BID) | ORAL | 0 refills | Status: AC

## 2024-02-20 MED ORDER — PREDNISONE 20 MG PO TABS: 40.0000 mg | ORAL_TABLET | Freq: Every day | ORAL | 0 refills | Status: AC

## 2024-02-20 NOTE — Patient Instructions (Signed)
 " [name] [name], thank you for joining [name] CHRISTELLA Dickinson, PA-C for today's virtual visit.  While this provider is not your primary care provider (PCP), if your PCP is located in our provider database this encounter information will be shared with them immediately following your visit.   A [name] [name] MyChart account gives you access to today's visit and all your visits, tests, and labs performed at [facility]  click here if you don't have a [facility] MyChart account or go to [email or web address]  Consent: (Patient) [name] [name] provided verbal consent for this virtual visit at the beginning of the encounter.  Current Medications:  Current Outpatient Medications:    amoxicillin -clavulanate (AUGMENTIN ) 875-125 MG tablet, Take 1 tablet by mouth 2 (two) times daily., Disp: 14 tablet, Rfl: 0   predniSONE  (DELTASONE ) 20 MG tablet, Take 2 tablets (40 mg total) by mouth daily with breakfast., Disp: 10 tablet, Rfl: 0   baclofen  (LIORESAL ) 10 MG tablet, Take 1 tablet (10 mg total) by mouth at bedtime., Disp: 90 each, Rfl: 1   fluticasone  (FLONASE ) 50 MCG/ACT nasal spray, Place 2 sprays into both nostrils daily., Disp: 16 g, Rfl: 6   levonorgestrel  (MIRENA , 52 MG,) 20 MCG/24HR IUD, Mirena  20 mcg/24 hours (6 yrs) 52 mg intrauterine device  Take 1 device by intrauterine route., Disp: , Rfl:    LORazepam  (ATIVAN ) 1 MG tablet, Take 0.5-1 tablets (0.5-1 mg total) by mouth every 8 (eight) hours as needed for anxiety., Disp: 30 tablet, Rfl: 1   Multiple Vitamin (MULTIVITAMIN) tablet, Take 1 tablet by mouth daily., Disp: , Rfl:    tretinoin  (RETIN-A ) 0.1 % cream, Apply 1 Application topically at bedtime for acne., Disp: 45 g, Rfl: 1   Medications ordered in this encounter:  Meds ordered this encounter  Medications   amoxicillin -clavulanate (AUGMENTIN ) 875-125 MG tablet    Sig: Take 1 tablet by mouth 2 (two) times daily.    Dispense:  14 tablet    Refill:  0    Supervising Provider:    LAMPTEY, PHILIP O [[id number]]   predniSONE  (DELTASONE ) 20 MG tablet    Sig: Take 2 tablets (40 mg total) by mouth daily with breakfast.    Dispense:  10 tablet    Refill:  0    Supervising Provider:   BLAISE ALEENE KIDD [[id number]]     *If you need refills on other medications prior to your next appointment, please contact your pharmacy*  Follow-Up: Call back or seek an in-person evaluation if the symptoms worsen or if the condition fails to improve as anticipated.  [facility] [phone]  Other Instructions  Eustachian Tube Dysfunction  Eustachian tube dysfunction refers to a condition in which a blockage develops in the narrow passage that connects the middle ear to the back of the nose (eustachian tube). The eustachian tube regulates air pressure in the middle ear by letting air move between the ear and nose. It also helps to drain fluid from the middle ear space. Eustachian tube dysfunction can affect one or both ears. When the eustachian tube does not function properly, air pressure, fluid, or both can build up in the middle ear. What are the causes? This condition occurs when the eustachian tube becomes blocked or cannot open normally. Common causes of this condition include: Ear infections. Colds and other infections that affect the nose, mouth, and throat (upper respiratory tract). Allergies. Irritation from cigarette smoke. Irritation from stomach acid coming up into the esophagus (gastroesophageal reflux).  The esophagus is the part of the body that moves food from the mouth to the stomach. Sudden changes in air pressure, such as from descending in an airplane or scuba diving. Abnormal growths in the nose or throat, such as: Growths that line the nose (nasal polyps). Abnormal growth of cells (tumors). Enlarged tissue at the back of the throat (adenoids). What increases the risk? You are more likely to develop this condition if: You smoke. You are  overweight. You are a child who has: Certain birth defects of the mouth, such as cleft palate. Large tonsils or adenoids. What are the signs or symptoms? Common symptoms of this condition include: A feeling of fullness in the ear. Ear pain. Clicking or popping noises in the ear. Ringing in the ear (tinnitus). Hearing loss. Loss of balance. Dizziness. Symptoms may get worse when the air pressure around you changes, such as when you travel to an area of high elevation, fly on an airplane, or go scuba diving. How is this diagnosed? This condition may be diagnosed based on: Your symptoms. A physical exam of your ears, nose, and throat. Tests, such as those that measure: The movement of your eardrum. Your hearing (audiometry). How is this treated? Treatment depends on the cause and severity of your condition. In mild cases, you may relieve your symptoms by moving air into your ears. This is called popping the ears. In more severe cases, or if you have symptoms of fluid in your ears, treatment may include: Medicines to relieve congestion (decongestants). Medicines that treat allergies (antihistamines). Nasal sprays or ear drops that contain medicines that reduce swelling (steroids). A procedure to drain the fluid in your eardrum. In this procedure, a small tube may be placed in the eardrum to: Drain the fluid. Restore the air in the middle ear space. A procedure to insert a balloon device through the nose to inflate the opening of the eustachian tube (balloon dilation). Follow these instructions at home: Lifestyle Do not do any of the following until your health care provider approves: Travel to high altitudes. Fly in airplanes. Work in a estate agent or room. Scuba dive. Do not use any products that contain nicotine or tobacco. These products include cigarettes, chewing tobacco, and vaping devices, such as e-cigarettes. If you need help quitting, ask your health care  provider. Keep your ears dry. Wear fitted earplugs during showering and bathing. Dry your ears completely after. General instructions Take over-the-counter and prescription medicines only as told by your health care provider. Use techniques to help pop your ears as recommended by your health care provider. These may include: Chewing gum. Yawning. Frequent, forceful swallowing. Closing your mouth, holding your nose closed, and gently blowing as if you are trying to blow air out of your nose. Keep all follow-up visits. This is important. Contact a health care provider if: Your symptoms do not go away after treatment. Your symptoms come back after treatment. You are unable to pop your ears. You have: A fever. Pain in your ear. Pain in your head or neck. Fluid draining from your ear. Your hearing suddenly changes. You become very dizzy. You lose your balance. Get help right away if: You have a sudden, severe increase in any of your symptoms. Summary Eustachian tube dysfunction refers to a condition in which a blockage develops in the eustachian tube. It can be caused by ear infections, allergies, inhaled irritants, or abnormal growths in the nose or throat. Symptoms may include ear pain or fullness, hearing  loss, or ringing in the ears. Mild cases are treated with techniques to unblock the ears, such as yawning or chewing gum. More severe cases are treated with medicines or procedures. This information is not intended to replace advice given to you by your health care provider. Make sure you discuss any questions you have with your health care provider. Document Revised: 04/18/2020 Document Reviewed: 04/18/2020 Elsevier Patient Education  2024 Elsevier Inc.   Acute Bronchitis, Adult  Acute bronchitis is sudden inflammation of the main airways (bronchi) that come off the windpipe (trachea) in the lungs. The swelling causes the airways to get smaller and make more mucus than normal.  This can make it hard to breathe and can cause coughing or noisy breathing (wheezing). Acute bronchitis may last several weeks. The cough may last longer. Allergies, asthma, and exposure to smoke may make the condition worse. What are the causes? This condition can be caused by germs and by substances that irritate the lungs, including: Cold and flu viruses. The most common cause of this condition is the virus that causes the common cold. Bacteria. This is less common. Breathing in substances that irritate the lungs, including: Smoke from cigarettes and other forms of tobacco. Dust and pollen. Fumes from household cleaning products, gases, or burned fuel. Indoor or outdoor air pollution. What increases the risk? The following factors may make you more likely to develop this condition: A weak body's defense system, also called the immune system. A condition that affects your lungs and breathing, such as asthma. What are the signs or symptoms? Common symptoms of this condition include: Coughing. This may bring up clear, yellow, or green mucus from your lungs (sputum). Wheezing. Runny or stuffy nose. Having too much mucus in your lungs (chest congestion). Shortness of breath. Aches and pains, including sore throat or chest. How is this diagnosed? This condition is usually diagnosed based on: Your symptoms and medical history. A physical exam. You may also have other tests, including tests to rule out other conditions, such as pneumonia. These tests include: A test of lung function. Test of a mucus sample to look for the presence of bacteria. Tests to check the oxygen level in your blood. Blood tests. Chest X-ray. How is this treated? Most cases of acute bronchitis clear up over time without treatment. Your health care provider may recommend: Drinking more fluids to help thin your mucus so it is easier to cough up. Taking inhaled medicine (inhaler) to improve air flow in and out of  your lungs. Using a vaporizer or a humidifier. These are machines that add water to the air to help you breathe better. Taking a medicine that thins mucus and clears congestion (expectorant). Taking a medicine that prevents or stops coughing (cough suppressant). It is not common to take an antibiotic medicine for this condition. Follow these instructions at home:  Take over-the-counter and prescription medicines only as told by your health care provider. Use an inhaler, vaporizer, or humidifier as told by your health care provider. Take two teaspoons (10 mL) of honey at bedtime to lessen coughing at night. Drink enough fluid to keep your urine pale yellow. Do not use any products that contain nicotine or tobacco. These products include cigarettes, chewing tobacco, and vaping devices, such as e-cigarettes. If you need help quitting, ask your health care provider. Get plenty of rest. Return to your normal activities as told by your health care provider. Ask your health care provider what activities are safe for you. Keep  all follow-up visits. This is important. How is this prevented? To lower your risk of getting this condition again: Wash your hands often with soap and water for at least 20 seconds. If soap and water are not available, use hand sanitizer. Avoid contact with people who have cold symptoms. Try not to touch your mouth, nose, or eyes with your hands. Avoid breathing in smoke or chemical fumes. Breathing smoke or chemical fumes will make your condition worse. Get the flu shot every year. Contact a health care provider if: Your symptoms do not improve after 2 weeks. You have trouble coughing up the mucus. Your cough keeps you awake at night. You have a fever. Get help right away if you: Cough up blood. Feel pain in your chest. Have severe shortness of breath. Faint or keep feeling like you are going to faint. Have a severe headache. Have a fever or chills that get  worse. These symptoms may represent a serious problem that is an emergency. Do not wait to see if the symptoms will go away. Get medical help right away. Call your local emergency services (911 in the [place] Do not drive yourself to the hospital. Summary Acute bronchitis is inflammation of the main airways (bronchi) that come off the windpipe (trachea) in the lungs. The swelling causes the airways to get smaller and make more mucus than normal. Drinking more fluids can help thin your mucus so it is easier to cough up. Take over-the-counter and prescription medicines only as told by your health care provider. Do not use any products that contain nicotine or tobacco. These products include cigarettes, chewing tobacco, and vaping devices, such as e-cigarettes. If you need help quitting, ask your health care provider. Contact a health care provider if your symptoms do not improve after 2 weeks. This information is not intended to replace advice given to you by your health care provider. Make sure you discuss any questions you have with your health care provider. Document Revised: 05/18/2021 Document Reviewed: 06/08/2020 Elsevier Patient Education  2024 Elsevier Inc.   If you have been instructed to have an in-person evaluation today at a local Urgent Care facility, please use the link below. It will take you to a list of all of our available [facility], including address, phone number and hours of operation. Please do not delay care.  [facility]  If you or a family member do not have a primary care provider, use the link below to schedule a visit and establish care. When you choose a [facility] primary care physician or advanced practice provider, you gain a long-term partner in health. Find a Primary Care Provider  Learn more about [facility]'s in-office and virtual care options: [facility] Get Care Now "

## 2024-02-20 NOTE — Progress Notes (Signed)
 " Virtual Visit Consent   [name] [name], you are scheduled for a virtual visit with a [facility] provider today. Just as with appointments in the office, your consent must be obtained to participate. Your consent will be active for this visit and any virtual visit you may have with one of our providers in the next 365 days. If you have a MyChart account, a copy of this consent can be sent to you electronically.  As this is a virtual visit, video technology does not allow for your provider to perform a traditional examination. This may limit your provider's ability to fully assess your condition. If your provider identifies any concerns that need to be evaluated in person or the need to arrange testing (such as labs, EKG, etc.), we will make arrangements to do so. Although advances in technology are sophisticated, we cannot ensure that it will always work on either your end or our end. If the connection with a video visit is poor, the visit may have to be switched to a telephone visit. With either a video or telephone visit, we are not always able to ensure that we have a secure connection.  By engaging in this virtual visit, you consent to the provision of healthcare and authorize for your insurance to be billed (if applicable) for the services provided during this visit. Depending on your insurance coverage, you may receive a charge related to this service.  I need to obtain your verbal consent now. Are you willing to proceed with your visit today? [name] has provided verbal consent on 02/20/2024 for a virtual visit (video or telephone). [name], PA-C  Date: 02/20/2024 9:15 AM   Virtual Visit via Video Note   I, [name], connected with  [name] [name]  ([id number], [id number]) on 02/20/2024 at  9:15 AM EST by a video-enabled telemedicine application and verified that I am speaking with the correct person using two identifiers.  Location: Patient: Virtual Visit Location  Patient: Home Provider: Virtual Visit Location Provider: Home Office   I discussed the limitations of evaluation and management by telemedicine and the availability of in person appointments. The patient expressed understanding and agreed to proceed.    History of Present Illness: [name] [name] is a 44 y.o. who identifies as a female who was assigned female at birth, and is being seen today for cough and right ear pain.  HPI: URI  This is a new problem. The current episode started 1 to 4 weeks ago (before Christmas). The problem has been unchanged. There has been no fever. Associated symptoms include congestion, coughing (productive of thick phlegm that is dark green), ear pain (right), headaches, a plugged ear sensation (right), rhinorrhea (and post nasal drainage) and a sore throat (initial, now improved). Pertinent negatives include no diarrhea, nausea, sinus pain, vomiting or wheezing. She has tried decongestant (elderberry syrup, oil of oregano, sudafed, Afrin) for the symptoms. The treatment provided no relief.     Problems:  Patient Active Problem List   Diagnosis Date Noted   Milia 09/03/2023   Common wart 09/03/2023   Trouble in sleeping 12/23/2020   Low energy 10/09/2019   Vitamin D  deficiency 10/09/2019   Breast pain, left 03/25/2019   Teeth grinding 03/24/2019   Frequent headaches 03/24/2019   Left breast mass 03/24/2019   Encounter for counseling 01/21/2019   Other headache syndrome 09/16/2018   Weight loss 09/16/2018   Hair loss 09/16/2018   Chronic upper back pain  12/16/2016   Anxiety 12/14/2016   Class 1 obesity due to excess calories without serious comorbidity with body mass index (BMI) of 34.0 to 34.9 in adult 11/22/2015   Normal vaginal delivery 04/22/2015   Normal labor 04/21/2015   Pregnancy 10/18/2014   GAD (generalized anxiety disorder) 06/22/2014   PTSD (post-traumatic stress disorder) 06/22/2014   Binge eating disorder 04/21/2014   Gastroesophageal  reflux disease 04/21/2014   Acne vulgaris 01/11/2014   Stress due to illness of family member 11/29/2013   Obesity (BMI 30.0-34.9) 01/12/2013   Malpositioned IUD 04/01/2012   Anemia 01/31/2012   History of abnormal Pap smear 08/13/2011   History of depression 08/13/2011   Migraines 03/24/2011    Allergies: Allergies[1] Medications: Current Medications[2]  Observations/Objective: Patient is well-developed, well-nourished in no acute distress.  Resting comfortably at home.  Head is normocephalic, atraumatic.  No labored breathing.  Speech is clear and coherent with logical content.  Patient is alert and oriented at baseline.    Assessment and Plan: 1. Acute bacterial bronchitis (Primary) - amoxicillin -clavulanate (AUGMENTIN ) 875-125 MG tablet; Take 1 tablet by mouth 2 (two) times daily.  Dispense: 14 tablet; Refill: 0  2. ETD (Eustachian tube dysfunction), right - predniSONE  (DELTASONE ) 20 MG tablet; Take 2 tablets (40 mg total) by mouth daily with breakfast.  Dispense: 10 tablet; Refill: 0  - Worsening over a week despite OTC medications - Will treat with Augmentin  and Prednisone  - Can add Mucinex  during the daytime - Push fluids.  - Rest.  - Steam and humidifier can help - Seek in person evaluation if worsening or symptoms fail to improve    Follow Up Instructions: I discussed the assessment and treatment plan with the patient. The patient was provided an opportunity to ask questions and all were answered. The patient agreed with the plan and demonstrated an understanding of the instructions.  A copy of instructions were sent to the patient via MyChart unless otherwise noted below.    The patient was advised to call back or seek an in-person evaluation if the symptoms worsen or if the condition fails to improve as anticipated.    [name], PA-C     [1]  Allergies Allergen Reactions   Wellbutrin [Bupropion] Other (See Comments)    CAUSES ANGER    Adhesive [Tape]     SKIN IRRITATION   Azithromycin  Nausea And Vomiting    Severe GI cramping  [2]  Current Outpatient Medications:    amoxicillin -clavulanate (AUGMENTIN ) 875-125 MG tablet, Take 1 tablet by mouth 2 (two) times daily., Disp: 14 tablet, Rfl: 0   predniSONE  (DELTASONE ) 20 MG tablet, Take 2 tablets (40 mg total) by mouth daily with breakfast., Disp: 10 tablet, Rfl: 0   baclofen  (LIORESAL ) 10 MG tablet, Take 1 tablet (10 mg total) by mouth at bedtime., Disp: 90 each, Rfl: 1   fluticasone  (FLONASE ) 50 MCG/ACT nasal spray, Place 2 sprays into both nostrils daily., Disp: 16 g, Rfl: 6   levonorgestrel  (MIRENA , 52 MG,) 20 MCG/24HR IUD, Mirena  20 mcg/24 hours (6 yrs) 52 mg intrauterine device  Take 1 device by intrauterine route., Disp: , Rfl:    LORazepam  (ATIVAN ) 1 MG tablet, Take 0.5-1 tablets (0.5-1 mg total) by mouth every 8 (eight) hours as needed for anxiety., Disp: 30 tablet, Rfl: 1   Multiple Vitamin (MULTIVITAMIN) tablet, Take 1 tablet by mouth daily., Disp: , Rfl:    tretinoin  (RETIN-A ) 0.1 % cream, Apply 1 Application topically at bedtime for acne., Disp: 45 g,  Rfl: 1  "

## 2024-03-04 ENCOUNTER — Ambulatory Visit: Payer: Self-pay

## 2024-03-04 NOTE — Telephone Encounter (Signed)
 FYI Only or Action Required?: FYI only for provider: appointment scheduled on 03/05/24.  Patient was last seen in primary care on 02/20/2024 by Vivienne Delon HERO, PA-C.  Called Nurse Triage reporting URI.  Symptoms began several weeks ago.  Interventions attempted: Prescription medications: Augmentin .  Symptoms are: unchanged.  Triage Disposition: See Physician Within 24 Hours  Patient/caregiver understands and will follow disposition?:  Reason for Disposition  [1] Taking antibiotic > 72 hours (3 days) AND [2] sinus pain not improved  Answer Assessment - Initial Assessment Questions Pt completed amoxicillin -clavulanate (AUGMENTIN ) 875-125 MG tablet; Take 1 tablet by mouth 2 (two) times daily. Dispense: 14 tablet that was started on 02/20/24. States continues to have productive cough with dark yellow sputum.  1. ANTIBIOTIC: What antibiotic are you taking? How many times a day?     Augmentin  2. ONSET: When was the antibiotic started?     02/20/24, completed on the 7th 4. FEVER: Do you have a fever? If Yes, ask: What is it, how was it measured, and when did it start?      Denies 5. SYMPTOMS: Are there any other symptoms you're concerned about? If Yes, ask: When did it start?     Sinus pressure, productive cough  Protocols used: Sinus Infection on Antibiotic Follow-up Call-[facility]  Copied from CRM [id number]. Topic: Clinical - Red Word Triage >> Mar 04, 2024  8:41 AM Montie POUR wrote: Red Word that prompted transfer to Nurse Triage:  She has been coughing up mucus for last 2 days that is yellowish, still have cough and congestion. She had a video visit on 02/20/24 for this issue

## 2024-03-04 NOTE — Telephone Encounter (Signed)
 The patient is scheduled to see Joy on 03/05/24 at 1030 am to address the following concerns.

## 2024-03-05 ENCOUNTER — Ambulatory Visit: Payer: Self-pay | Admitting: Medical-Surgical

## 2024-03-05 ENCOUNTER — Encounter: Payer: Self-pay | Admitting: Medical-Surgical

## 2024-03-05 VITALS — BP 105/70 | HR 70 | Temp 98.5°F | Resp 16 | Ht 60.0 in | Wt 165.1 lb

## 2024-03-05 DIAGNOSIS — J208 Acute bronchitis due to other specified organisms: Secondary | ICD-10-CM

## 2024-03-05 DIAGNOSIS — B9689 Other specified bacterial agents as the cause of diseases classified elsewhere: Secondary | ICD-10-CM

## 2024-03-05 MED ORDER — CEFDINIR 300 MG PO CAPS: 300.0000 mg | ORAL_CAPSULE | Freq: Two times a day (BID) | ORAL | 0 refills | Status: AC

## 2024-03-05 MED ORDER — BENZONATATE 200 MG PO CAPS: 200.0000 mg | ORAL_CAPSULE | Freq: Three times a day (TID) | ORAL | 0 refills | Status: AC | PRN

## 2024-03-05 MED ORDER — ALBUTEROL SULFATE HFA 108 (90 BASE) MCG/ACT IN AERS: 2.0000 | INHALATION_SPRAY | Freq: Four times a day (QID) | RESPIRATORY_TRACT | 0 refills | Status: AC | PRN

## 2024-03-05 NOTE — Progress Notes (Signed)
 "       Established patient visit   History of Present Illness   Discussed the use of AI scribe software for clinical note transcription with the patient, who gave verbal consent to proceed.  History of Present Illness   [name] [name] is a 44 year old female with a history of bronchitis who presents with a persistent cough.  Cough and sputum production - Persistent cough since illness around New Year's - Cough has worsened over the last 5 to 6 days - Cough is worse at night - Produces dark yellow sputum, especially in the morning - Sore throat attributed to frequent coughing  Respiratory symptoms and interventions - Used husband's albuterol  inhaler for two nights, resulting in improved breathing - No shortness of breath reported - No fever - No ear pain  Sinus and headache symptoms - Maxillary sinus pain and pressure present - Headaches occur and improve with ibuprofen   Medication history and allergies - Partial relief of cough with antibiotics after initial illness - Allergic to azithromycin  with prior severe reaction - Previously prescribed Augmentin  and prednisone , but did not take prednisone  due to improvement in symptoms  Infectious disease testing - No recent flu or COVID testing      Physical Exam   Physical Exam Vitals reviewed.  Constitutional:      General: She is not in acute distress.    Appearance: Normal appearance. She is not ill-appearing.  HENT:     Head: Normocephalic and atraumatic.     Right Ear: Tympanic membrane, ear canal and external ear normal. There is no impacted cerumen.     Left Ear: Tympanic membrane, ear canal and external ear normal. There is no impacted cerumen.     Nose: Nose normal.     Mouth/Throat:     Mouth: Mucous membranes are moist.     Pharynx: Posterior oropharyngeal erythema (Mild) present.  Eyes:     General: No scleral icterus.       Right eye: No discharge.        Left eye: No discharge.     Extraocular  Movements: Extraocular movements intact.     Conjunctiva/sclera: Conjunctivae normal.     Pupils: Pupils are equal, round, and reactive to light.  Cardiovascular:     Rate and Rhythm: Normal rate and regular rhythm.     Pulses: Normal pulses.     Heart sounds: Normal heart sounds. No murmur heard.    No friction rub. No gallop.  Pulmonary:     Effort: Pulmonary effort is normal. No respiratory distress.     Breath sounds: Normal breath sounds. No wheezing.  Lymphadenopathy:     Cervical: No cervical adenopathy.  Skin:    General: Skin is warm and dry.  Neurological:     Mental Status: She is alert and oriented to person, place, and time.  Psychiatric:        Mood and Affect: Mood normal.        Behavior: Behavior normal.        Thought Content: Thought content normal.        Judgment: Judgment normal.    Assessment & Plan     Acute bacterial bronchitis Suspected bacterial bronchitis due to productive cough with dark yellow sputum and history. Previous treatment with Augmentin  and prednisone . Allergy to azithromycin  noted. - Prescribed Omnicef  300 mg twice daily x 7 days for bacterial coverage. - Provided Airsupra inhaler samples to reduce airway inflammation. - Sent prescription for  albuterol  inhaler to pharmacy. - Adding Tessalon  Perles 3 times daily as needed. - Already has prednisone  at home so encouraged her to go ahead and start the course to help resolve symptoms and reduce inflammation.    Follow up   Return if symptoms worsen or fail to improve. __________________________________ Zada FREDRIK Palin, DNP, APRN, FNP-BC Primary Care and Sports Medicine [facility] [place] "

## 2024-03-05 NOTE — Patient Instructions (Signed)
 Airsupra NDC: [id number] LOT: [id number] COO EXP: 06/2025
